# Patient Record
Sex: Male | Born: 1950 | Race: Black or African American | Hispanic: No | Marital: Married | Smoking: Former smoker
Health system: Southern US, Community
[De-identification: ages and names within clinical notes are randomized; demographics above are authoritative.]

## PROBLEM LIST (undated history)

## (undated) DIAGNOSIS — H353290 Exudative age-related macular degeneration, unspecified eye, stage unspecified: Secondary | ICD-10-CM

## (undated) DIAGNOSIS — I10 Essential (primary) hypertension: Secondary | ICD-10-CM

## (undated) DIAGNOSIS — Z978 Presence of other specified devices: Secondary | ICD-10-CM

## (undated) DIAGNOSIS — J449 Chronic obstructive pulmonary disease, unspecified: Secondary | ICD-10-CM

## (undated) DIAGNOSIS — N400 Enlarged prostate without lower urinary tract symptoms: Secondary | ICD-10-CM

## (undated) DIAGNOSIS — R339 Retention of urine, unspecified: Secondary | ICD-10-CM

## (undated) DIAGNOSIS — F419 Anxiety disorder, unspecified: Secondary | ICD-10-CM

## (undated) DIAGNOSIS — M25519 Pain in unspecified shoulder: Secondary | ICD-10-CM

## (undated) DIAGNOSIS — E7800 Pure hypercholesterolemia, unspecified: Secondary | ICD-10-CM

## (undated) DIAGNOSIS — Z960 Presence of urogenital implants: Secondary | ICD-10-CM

## (undated) HISTORY — DX: Chronic obstructive pulmonary disease, unspecified: J44.9

## (undated) HISTORY — DX: Essential (primary) hypertension: I10

## (undated) HISTORY — PX: OTHER SURGICAL HISTORY: SHX169

## (undated) HISTORY — PX: TONSILLECTOMY: SUR1361

## (undated) HISTORY — DX: Exudative age-related macular degeneration, unspecified eye, stage unspecified: H35.3290

## (undated) HISTORY — DX: Pure hypercholesterolemia, unspecified: E78.00

---

## 1998-07-16 ENCOUNTER — Emergency Department (HOSPITAL_COMMUNITY): Admission: EM | Admit: 1998-07-16 | Discharge: 1998-07-16 | Payer: Self-pay | Admitting: Emergency Medicine

## 1999-02-24 ENCOUNTER — Encounter: Payer: Self-pay | Admitting: Emergency Medicine

## 1999-02-24 ENCOUNTER — Emergency Department (HOSPITAL_COMMUNITY): Admission: EM | Admit: 1999-02-24 | Discharge: 1999-02-24 | Payer: Self-pay | Admitting: Emergency Medicine

## 2000-08-31 ENCOUNTER — Encounter: Payer: Self-pay | Admitting: *Deleted

## 2000-08-31 ENCOUNTER — Encounter: Admission: RE | Admit: 2000-08-31 | Discharge: 2000-08-31 | Payer: Self-pay | Admitting: *Deleted

## 2001-12-06 ENCOUNTER — Encounter: Payer: Self-pay | Admitting: Internal Medicine

## 2001-12-06 ENCOUNTER — Encounter: Admission: RE | Admit: 2001-12-06 | Discharge: 2001-12-06 | Payer: Self-pay | Admitting: Internal Medicine

## 2002-09-09 ENCOUNTER — Encounter: Admission: RE | Admit: 2002-09-09 | Discharge: 2002-09-09 | Payer: Self-pay | Admitting: Internal Medicine

## 2002-09-09 ENCOUNTER — Encounter: Payer: Self-pay | Admitting: Internal Medicine

## 2002-09-28 ENCOUNTER — Encounter: Admission: RE | Admit: 2002-09-28 | Discharge: 2002-12-27 | Payer: Self-pay | Admitting: Neurosurgery

## 2003-03-06 ENCOUNTER — Encounter: Admission: RE | Admit: 2003-03-06 | Discharge: 2003-03-06 | Payer: Self-pay | Admitting: Internal Medicine

## 2003-03-06 ENCOUNTER — Encounter: Payer: Self-pay | Admitting: Internal Medicine

## 2004-04-29 ENCOUNTER — Encounter: Admission: RE | Admit: 2004-04-29 | Discharge: 2004-04-29 | Payer: Self-pay | Admitting: Internal Medicine

## 2004-06-24 ENCOUNTER — Emergency Department (HOSPITAL_COMMUNITY): Admission: EM | Admit: 2004-06-24 | Discharge: 2004-06-24 | Payer: Self-pay | Admitting: *Deleted

## 2005-03-11 ENCOUNTER — Encounter: Admission: RE | Admit: 2005-03-11 | Discharge: 2005-03-11 | Payer: Self-pay | Admitting: Internal Medicine

## 2006-01-30 ENCOUNTER — Encounter: Admission: RE | Admit: 2006-01-30 | Discharge: 2006-01-30 | Payer: Self-pay | Admitting: Internal Medicine

## 2006-07-08 ENCOUNTER — Emergency Department (HOSPITAL_COMMUNITY): Admission: EM | Admit: 2006-07-08 | Discharge: 2006-07-08 | Payer: Self-pay | Admitting: Emergency Medicine

## 2007-12-13 ENCOUNTER — Encounter: Admission: RE | Admit: 2007-12-13 | Discharge: 2007-12-13 | Payer: Self-pay | Admitting: Internal Medicine

## 2011-07-31 ENCOUNTER — Encounter (INDEPENDENT_AMBULATORY_CARE_PROVIDER_SITE_OTHER): Payer: 59 | Admitting: Ophthalmology

## 2011-07-31 DIAGNOSIS — H43819 Vitreous degeneration, unspecified eye: Secondary | ICD-10-CM

## 2011-07-31 DIAGNOSIS — H2510 Age-related nuclear cataract, unspecified eye: Secondary | ICD-10-CM

## 2011-07-31 DIAGNOSIS — H353290 Exudative age-related macular degeneration, unspecified eye, stage unspecified: Secondary | ICD-10-CM

## 2011-07-31 DIAGNOSIS — H3530 Unspecified macular degeneration: Secondary | ICD-10-CM

## 2011-08-06 ENCOUNTER — Encounter (INDEPENDENT_AMBULATORY_CARE_PROVIDER_SITE_OTHER): Payer: 59 | Admitting: Ophthalmology

## 2011-08-06 DIAGNOSIS — H353290 Exudative age-related macular degeneration, unspecified eye, stage unspecified: Secondary | ICD-10-CM

## 2011-08-06 DIAGNOSIS — H3530 Unspecified macular degeneration: Secondary | ICD-10-CM

## 2011-08-06 DIAGNOSIS — H43819 Vitreous degeneration, unspecified eye: Secondary | ICD-10-CM

## 2011-10-15 ENCOUNTER — Encounter (INDEPENDENT_AMBULATORY_CARE_PROVIDER_SITE_OTHER): Payer: 59 | Admitting: Ophthalmology

## 2011-10-22 ENCOUNTER — Encounter (INDEPENDENT_AMBULATORY_CARE_PROVIDER_SITE_OTHER): Payer: 59 | Admitting: Ophthalmology

## 2011-10-22 DIAGNOSIS — H2510 Age-related nuclear cataract, unspecified eye: Secondary | ICD-10-CM

## 2011-10-22 DIAGNOSIS — H43819 Vitreous degeneration, unspecified eye: Secondary | ICD-10-CM

## 2011-10-22 DIAGNOSIS — H3530 Unspecified macular degeneration: Secondary | ICD-10-CM

## 2011-10-22 DIAGNOSIS — H353290 Exudative age-related macular degeneration, unspecified eye, stage unspecified: Secondary | ICD-10-CM

## 2011-10-30 ENCOUNTER — Encounter (INDEPENDENT_AMBULATORY_CARE_PROVIDER_SITE_OTHER): Payer: 59 | Admitting: Ophthalmology

## 2011-10-31 ENCOUNTER — Encounter (INDEPENDENT_AMBULATORY_CARE_PROVIDER_SITE_OTHER): Payer: 59 | Admitting: Ophthalmology

## 2011-11-03 ENCOUNTER — Encounter (INDEPENDENT_AMBULATORY_CARE_PROVIDER_SITE_OTHER): Payer: 59 | Admitting: Ophthalmology

## 2011-11-03 DIAGNOSIS — H43819 Vitreous degeneration, unspecified eye: Secondary | ICD-10-CM

## 2011-11-03 DIAGNOSIS — H3530 Unspecified macular degeneration: Secondary | ICD-10-CM

## 2011-11-03 DIAGNOSIS — H353290 Exudative age-related macular degeneration, unspecified eye, stage unspecified: Secondary | ICD-10-CM

## 2012-01-07 ENCOUNTER — Encounter (INDEPENDENT_AMBULATORY_CARE_PROVIDER_SITE_OTHER): Payer: 59 | Admitting: Ophthalmology

## 2012-01-07 DIAGNOSIS — H3530 Unspecified macular degeneration: Secondary | ICD-10-CM

## 2012-01-07 DIAGNOSIS — H353290 Exudative age-related macular degeneration, unspecified eye, stage unspecified: Secondary | ICD-10-CM

## 2012-01-07 DIAGNOSIS — H43819 Vitreous degeneration, unspecified eye: Secondary | ICD-10-CM

## 2012-02-10 ENCOUNTER — Encounter (INDEPENDENT_AMBULATORY_CARE_PROVIDER_SITE_OTHER): Payer: 59 | Admitting: Ophthalmology

## 2012-02-19 ENCOUNTER — Encounter (INDEPENDENT_AMBULATORY_CARE_PROVIDER_SITE_OTHER): Payer: 59 | Admitting: Ophthalmology

## 2012-02-19 DIAGNOSIS — H43819 Vitreous degeneration, unspecified eye: Secondary | ICD-10-CM

## 2012-02-19 DIAGNOSIS — H3530 Unspecified macular degeneration: Secondary | ICD-10-CM

## 2012-02-19 DIAGNOSIS — H2510 Age-related nuclear cataract, unspecified eye: Secondary | ICD-10-CM

## 2012-02-19 DIAGNOSIS — H35039 Hypertensive retinopathy, unspecified eye: Secondary | ICD-10-CM

## 2012-02-19 DIAGNOSIS — H353290 Exudative age-related macular degeneration, unspecified eye, stage unspecified: Secondary | ICD-10-CM

## 2012-02-19 DIAGNOSIS — I10 Essential (primary) hypertension: Secondary | ICD-10-CM

## 2012-03-31 ENCOUNTER — Encounter (INDEPENDENT_AMBULATORY_CARE_PROVIDER_SITE_OTHER): Payer: 59 | Admitting: Ophthalmology

## 2012-03-31 DIAGNOSIS — I10 Essential (primary) hypertension: Secondary | ICD-10-CM

## 2012-03-31 DIAGNOSIS — H353290 Exudative age-related macular degeneration, unspecified eye, stage unspecified: Secondary | ICD-10-CM

## 2012-03-31 DIAGNOSIS — H2510 Age-related nuclear cataract, unspecified eye: Secondary | ICD-10-CM

## 2012-03-31 DIAGNOSIS — H35039 Hypertensive retinopathy, unspecified eye: Secondary | ICD-10-CM

## 2012-03-31 DIAGNOSIS — H43819 Vitreous degeneration, unspecified eye: Secondary | ICD-10-CM

## 2012-03-31 DIAGNOSIS — H3530 Unspecified macular degeneration: Secondary | ICD-10-CM

## 2012-04-05 ENCOUNTER — Institutional Professional Consult (permissible substitution): Payer: 59 | Admitting: Pulmonary Disease

## 2012-04-28 ENCOUNTER — Ambulatory Visit (INDEPENDENT_AMBULATORY_CARE_PROVIDER_SITE_OTHER)
Admission: RE | Admit: 2012-04-28 | Discharge: 2012-04-28 | Disposition: A | Discharge status: Home / Self Care | Payer: 59 | Source: Ambulatory Visit | Attending: Pulmonary Disease | Admitting: Pulmonary Disease

## 2012-04-28 ENCOUNTER — Encounter: Payer: Self-pay | Admitting: Pulmonary Disease

## 2012-04-28 ENCOUNTER — Ambulatory Visit (INDEPENDENT_AMBULATORY_CARE_PROVIDER_SITE_OTHER): Payer: 59 | Admitting: Pulmonary Disease

## 2012-04-28 VITALS — BP 140/82 | HR 83 | Temp 98.1°F | Ht 71.5 in | Wt 243.8 lb

## 2012-04-28 DIAGNOSIS — Z720 Tobacco use: Secondary | ICD-10-CM

## 2012-04-28 DIAGNOSIS — J4489 Other specified chronic obstructive pulmonary disease: Secondary | ICD-10-CM

## 2012-04-28 DIAGNOSIS — R0683 Snoring: Secondary | ICD-10-CM

## 2012-04-28 DIAGNOSIS — R0600 Dyspnea, unspecified: Secondary | ICD-10-CM

## 2012-04-28 DIAGNOSIS — Z789 Other specified health status: Secondary | ICD-10-CM | POA: Insufficient documentation

## 2012-04-28 DIAGNOSIS — F17200 Nicotine dependence, unspecified, uncomplicated: Secondary | ICD-10-CM

## 2012-04-28 DIAGNOSIS — J449 Chronic obstructive pulmonary disease, unspecified: Secondary | ICD-10-CM

## 2012-04-28 DIAGNOSIS — R0989 Other specified symptoms and signs involving the circulatory and respiratory systems: Secondary | ICD-10-CM

## 2012-04-28 DIAGNOSIS — R0609 Other forms of dyspnea: Secondary | ICD-10-CM

## 2012-04-28 HISTORY — DX: Other specified chronic obstructive pulmonary disease: J44.89

## 2012-04-28 HISTORY — DX: Chronic obstructive pulmonary disease, unspecified: J44.9

## 2012-04-28 NOTE — Progress Notes (Signed)
Chief Complaint  Patient presents with  . Advice Only    self referral. Pt c/o increase SOB and worse at night, cough w/ milky color phlem, a lot of wheezing and chest tx at night    History of Present Illness: Tim Thomas is a 61 y.o. male smoker for evaluation of COPD.  He was told he has COPD years ago after getting a chest xray.  He reports being followed previously by Dr. Corine Shelter.  He has an Echo and breathing test done about 2 years ago, and was told these supported the diagnosis of COPD.  He has noticed trouble with his breathing for years.  He gets a cough productive of milky white sputum.  He denies hemoptysis.  He gets wheezing, and soreness under his ribs.  He denies fever, but does get sweats at night.  He continues to smoke cigarettes, but says he quit 4 days ago.  He was never told he has asthma, but has been treated for allergies before.  He used to be on allergy shots.  He was also taking singulair before, and felt this helped.  He ran out of singulair, and did not get it refilled.  He was told he is allergic to dust, pollen, smoke, and bees.  Oddly, he continues to raise bees.  He has need to go to the emergency room several times after being stung by a bee.  He does have an Epi-pen.  He did get his flu shot last fall, but had a bad case of the flu anyway.  He had pneumonia years ago, but denies exposure to TB.  He tried spiriva before, and this seemed to help.  This was stopped after he saw Dr. Petra Kuba, and was advised to use symbicort and ventolin instead.  He used these once, but stopped after he felt his lungs burning.   He is from [place], but was raised in [place].  He moved to [place] after joining the [employer] in [place].  He is a retired Emergency planning/management officer.  He likes to hunt.  He has pet cats and dogs.  He has trouble with reflux, especially at night.  He snores, and feels sleepy during the day.  He goes to bed at 8 pm, and wakes up at 1 am.  He will  go back to sleep at 3 am, and start his day at 7 am.   Past Medical History  Diagnosis Date  . Hypertension   . High cholesterol   . COPD (chronic obstructive pulmonary disease)     Dr. Petra Kuba  . Wet senile macular degeneration     rt eye    Past Surgical History  Procedure Date  . Left ring finger surgery   . Tonsillectomy     Current Outpatient Prescriptions on File Prior to Visit  Medication Sig Dispense Refill  . amLODipine (NORVASC) 5 MG tablet Take 5 mg by mouth daily.      . RABEprazole (ACIPHEX) 20 MG tablet Take 20 mg by mouth daily as needed.      . rosuvastatin (CRESTOR) 20 MG tablet Take 20 mg by mouth daily.        Allergies  Allergen Reactions  . Ivp Dye (Iodinated Diagnostic Agents)     family history includes Lung cancer in his mother.   reports that he has been smoking Cigarettes.  He has a 46 pack-year smoking history. He does not have any smokeless tobacco history on file. He reports that he does  not drink alcohol or use illicit drugs.  Review of Systems  Constitutional: Negative for fever, appetite change and unexpected weight change.  HENT: Negative for ear pain, congestion, sore throat, sneezing, trouble swallowing and dental problem.   Respiratory: Positive for cough and shortness of breath.   Cardiovascular: Positive for chest pain. Negative for palpitations and leg swelling.  Gastrointestinal: Negative for abdominal pain.  Musculoskeletal: Positive for arthralgias. Negative for joint swelling.  Skin: Negative for color change and rash.  Neurological: Negative for headaches.  Psychiatric/Behavioral: Negative for dysphoric mood. The patient is not nervous/anxious.     Physical Exam: BP 140/82  Pulse 83  Temp(Src) 98.1 F (36.7 C) (Oral)  Ht 5' 11.5" (1.816 m)  Wt 243 lb 12.8 oz (110.587 kg)  BMI 33.53 kg/m2  SpO2 98%  Body mass index is 33.53 kg/(m^2).   General - Obese  HEENT - PERRLA, EOMI, no sinus tenderness, no nasal  discharge, MP 3, no oral exudate, no LAN Cardiac - s1s2 regular, no murmur Chest - no wheeze/rales/dullness Abdomen - soft, nontender, normal bowel sounds Extremities - no e/c/c Neurologic - normal strength, CN intact Skin - no rashes Psychiatric - normal mood, behavior  Assessment/Plan:  Outpatient Encounter Prescriptions as of 04/28/2012  Medication Sig Dispense Refill  . amLODipine (NORVASC) 5 MG tablet Take 5 mg by mouth daily.      . RABEprazole (ACIPHEX) 20 MG tablet Take 20 mg by mouth daily as needed.      . rosuvastatin (CRESTOR) 20 MG tablet Take 20 mg by mouth daily.        Aubriana Ravelo Pager:  [phone] 04/28/2012, 2:31 PM

## 2012-04-28 NOTE — Assessment & Plan Note (Addendum)
He is a smoker and has significant allergy history.  He has been told previously that he has COPD.  His symptoms are suggestive of asthma and/or obstructive lung disease.  Will arrange for chest xray and pulmonary function testing before resuming bronchodilator therapy.  If he does have COPD, then will need to assess for alpha 1 anti-trypsin deficiency.  Additional evaluation for his dyspnea will be based on results of his chest xray and PFT.

## 2012-04-28 NOTE — Patient Instructions (Signed)
Chest xray today Will schedule breathing test Follow up in 2 weeks

## 2012-04-28 NOTE — Progress Notes (Deleted)
  Subjective:    Patient ID: Tim Thomas, male    DOB: 1951-02-19, 61 y.o.   MRN: [id number]  HPI    Review of Systems  Constitutional: Negative for fever, appetite change and unexpected weight change.  HENT: Negative for ear pain, congestion, sore throat, sneezing, trouble swallowing and dental problem.   Respiratory: Positive for cough and shortness of breath.   Cardiovascular: Positive for chest pain. Negative for palpitations and leg swelling.  Gastrointestinal: Negative for abdominal pain.  Musculoskeletal: Positive for arthralgias. Negative for joint swelling.  Skin: Negative for color change and rash.  Neurological: Negative for headaches.  Psychiatric/Behavioral: Negative for dysphoric mood. The patient is not nervous/anxious.        Objective:   Physical Exam        Assessment & Plan:

## 2012-04-29 ENCOUNTER — Encounter: Payer: Self-pay | Admitting: Pulmonary Disease

## 2012-04-29 NOTE — Assessment & Plan Note (Signed)
I have encouraged him to maintain his smoking abstinence.

## 2012-04-29 NOTE — Assessment & Plan Note (Signed)
We discussed the importance of allergen avoidance.  Depending on results of chest xray and PFT he may need further allergy interventions.

## 2012-04-29 NOTE — Assessment & Plan Note (Signed)
He has snoring, witnessed apnea, sleep disruption, and daytimes sleepiness.  He has a history of hypertension.  I am concerned he could have sleep apnea.  He would like to defer any sleep evaluation until his other respiratory conditions are under better control.

## 2012-04-30 ENCOUNTER — Telehealth: Payer: Self-pay | Admitting: Pulmonary Disease

## 2012-04-30 NOTE — Telephone Encounter (Signed)
Dg Chest 2 View  04/28/2012  *RADIOLOGY REPORT*  Clinical Data: Cough, shortness of breath, chest pain, hypertension, smoker, COPD  CHEST - 2 VIEW  Comparison: 12/13/2007  Findings: Normal heart size, mediastinal contours, and pulmonary vascularity. Lungs appear emphysematous with minimal peribronchial thickening. No pulmonary infiltrate, pleural effusion or pneumothorax. No acute osseous findings.  IMPRESSION: Emphysematous changes. No acute abnormalities.  Original Report Authenticated By: Lollie Marrow, M.D.    Will have my nurse inform patient that chest xray showed expected changes of emphysema.  Will discuss in more detail at next visit.

## 2012-05-04 NOTE — Telephone Encounter (Signed)
I spoke with patient about results and he verbalized understanding and had no questions 

## 2012-05-12 ENCOUNTER — Ambulatory Visit (INDEPENDENT_AMBULATORY_CARE_PROVIDER_SITE_OTHER): Payer: 59 | Admitting: Pulmonary Disease

## 2012-05-12 DIAGNOSIS — R0989 Other specified symptoms and signs involving the circulatory and respiratory systems: Secondary | ICD-10-CM

## 2012-05-12 DIAGNOSIS — R0600 Dyspnea, unspecified: Secondary | ICD-10-CM

## 2012-05-12 LAB — PULMONARY FUNCTION TEST

## 2012-05-12 NOTE — Progress Notes (Signed)
PFT done today. 

## 2012-05-20 ENCOUNTER — Encounter: Payer: Self-pay | Admitting: Pulmonary Disease

## 2012-05-20 ENCOUNTER — Ambulatory Visit (INDEPENDENT_AMBULATORY_CARE_PROVIDER_SITE_OTHER): Payer: 59 | Admitting: Pulmonary Disease

## 2012-05-20 VITALS — BP 118/80 | HR 68 | Temp 98.3°F | Ht 71.0 in | Wt 245.4 lb

## 2012-05-20 DIAGNOSIS — R0600 Dyspnea, unspecified: Secondary | ICD-10-CM

## 2012-05-20 DIAGNOSIS — R0683 Snoring: Secondary | ICD-10-CM

## 2012-05-20 DIAGNOSIS — R0609 Other forms of dyspnea: Secondary | ICD-10-CM

## 2012-05-20 DIAGNOSIS — Z789 Other specified health status: Secondary | ICD-10-CM

## 2012-05-20 DIAGNOSIS — J449 Chronic obstructive pulmonary disease, unspecified: Secondary | ICD-10-CM

## 2012-05-20 MED ORDER — ALBUTEROL SULFATE HFA 108 (90 BASE) MCG/ACT IN AERS: 2.0000 | INHALATION_SPRAY | Freq: Four times a day (QID) | RESPIRATORY_TRACT | Status: DC | PRN

## 2012-05-20 MED ORDER — BECLOMETHASONE DIPROPIONATE 80 MCG/ACT IN AERS: 2.0000 | INHALATION_SPRAY | Freq: Two times a day (BID) | RESPIRATORY_TRACT | Status: DC

## 2012-05-20 NOTE — Patient Instructions (Signed)
Qvar two puffs twice per day, and rinse mouth after using Proair two puffs as needed for cough, wheeze, chest tightness, or chest congestion Follow up in 6 weeks

## 2012-05-20 NOTE — Assessment & Plan Note (Signed)
He has mild obstruction with bronchodilator response on PFT.  He has mild changes of COPD on CXR.  He has history of allergies.  He has stopped smoking.  Will have him use Qvar 80 two puffs bid, and proair prn.  Will determine if he needs further assessment for allergies at next visit.

## 2012-05-20 NOTE — Assessment & Plan Note (Signed)
We discussed the importance of allergen avoidance.  Depending on response to inhaler therapy he may need further allergy interventions.

## 2012-05-20 NOTE — Assessment & Plan Note (Signed)
He has snoring, witnessed apnea, sleep disruption, and daytimes sleepiness.  He has a history of hypertension.  I am concerned he could have sleep apnea.  He would like to defer any sleep evaluation until his other respiratory conditions are under better control. 

## 2012-05-20 NOTE — Progress Notes (Signed)
Chief Complaint  Patient presents with  . Follow-up    Pt here to discuss PFT results...pt says his breathing, cough and wheezing have improved.    History of Present Illness: Tim Thomas is a 61 y.o. male former smoker with COPD, rhinitis, and snoring.  He is here to review chest xray and PFT.  He continues to have chest tightness especially when working outside.  He still gets winded.  He felt nebulizer therapy with PFT helped.  He still has snoring.  He has not smoked since last visit.  Past Medical History  Diagnosis Date  . Hypertension   . High cholesterol   . COPD (chronic obstructive pulmonary disease)     Dr. Petra Kuba  . Wet senile macular degeneration     rt eye    Past Surgical History  Procedure Date  . Left ring finger surgery   . Tonsillectomy     Allergies  Allergen Reactions  . Ivp Dye (Iodinated Diagnostic Agents)     Physical Exam:  Blood pressure 118/80, pulse 68, temperature 98.3 F (36.8 C), temperature source Oral, height 5\' 11"  (1.803 m), weight 245 lb 6.4 oz (111.313 kg), SpO2 97.00%.  Body mass index is 34.23 kg/(m^2).  Wt Readings from Last 2 Encounters:  05/20/12 245 lb 6.4 oz (111.313 kg)  04/28/12 243 lb 12.8 oz (110.587 kg)    General - Obese  HEENT - no sinus tenderness, no nasal discharge, MP 3, no oral exudate, no LAN  Cardiac - s1s2 regular, no murmur  Chest - no wheeze/rales/dullness  Abdomen - soft, nontender Extremities - no e/c/c  Neurologic - normal strength Skin - no rashes  Psychiatric - normal mood, behavior   PFT 05/12/12>>FEV1 3.37(99%), FEV1% 68, TLC 7.33(103%), DLCO 116%, +BD  Dg Chest 2 View  04/28/2012  *RADIOLOGY REPORT*  Clinical Data: Cough, shortness of breath, chest pain, hypertension, smoker, COPD  CHEST - 2 VIEW  Comparison: 12/13/2007  Findings: Normal heart size, mediastinal contours, and pulmonary vascularity. Lungs appear emphysematous with minimal peribronchial thickening. No pulmonary  infiltrate, pleural effusion or pneumothorax. No acute osseous findings.  IMPRESSION: Emphysematous changes. No acute abnormalities.  Original Report Authenticated By: Lollie Marrow, M.D.   Assessment/Plan:  Outpatient Encounter Prescriptions as of 05/20/2012  Medication Sig Dispense Refill  . amLODipine (NORVASC) 5 MG tablet Take 5 mg by mouth daily.      . RABEprazole (ACIPHEX) 20 MG tablet Take 20 mg by mouth daily as needed.      . rosuvastatin (CRESTOR) 20 MG tablet Take 20 mg by mouth daily.        Jaymes Hang Pager:  [phone] 05/20/2012, 1:59 PM

## 2012-05-26 ENCOUNTER — Encounter: Payer: Self-pay | Admitting: Pulmonary Disease

## 2012-06-30 ENCOUNTER — Encounter (INDEPENDENT_AMBULATORY_CARE_PROVIDER_SITE_OTHER): Payer: 59 | Admitting: Ophthalmology

## 2012-06-30 DIAGNOSIS — H43819 Vitreous degeneration, unspecified eye: Secondary | ICD-10-CM

## 2012-06-30 DIAGNOSIS — H353290 Exudative age-related macular degeneration, unspecified eye, stage unspecified: Secondary | ICD-10-CM

## 2012-06-30 DIAGNOSIS — H35039 Hypertensive retinopathy, unspecified eye: Secondary | ICD-10-CM

## 2012-06-30 DIAGNOSIS — I10 Essential (primary) hypertension: Secondary | ICD-10-CM

## 2012-06-30 DIAGNOSIS — H3530 Unspecified macular degeneration: Secondary | ICD-10-CM

## 2012-07-05 ENCOUNTER — Ambulatory Visit (INDEPENDENT_AMBULATORY_CARE_PROVIDER_SITE_OTHER): Payer: 59 | Admitting: Pulmonary Disease

## 2012-07-05 ENCOUNTER — Encounter: Payer: Self-pay | Admitting: Pulmonary Disease

## 2012-07-05 VITALS — BP 134/76 | HR 68 | Temp 98.2°F | Ht 71.5 in | Wt 248.2 lb

## 2012-07-05 DIAGNOSIS — R0683 Snoring: Secondary | ICD-10-CM

## 2012-07-05 DIAGNOSIS — R0989 Other specified symptoms and signs involving the circulatory and respiratory systems: Secondary | ICD-10-CM

## 2012-07-05 DIAGNOSIS — Z789 Other specified health status: Secondary | ICD-10-CM

## 2012-07-05 DIAGNOSIS — J449 Chronic obstructive pulmonary disease, unspecified: Secondary | ICD-10-CM

## 2012-07-05 NOTE — Assessment & Plan Note (Signed)
Improved since he has stopped smoking.

## 2012-07-05 NOTE — Progress Notes (Signed)
Chief Complaint  Patient presents with  . Follow-up    Pt reports feleling "pretty good", occasional SOB, denies chest tightness, wheezing and coughing, pt reports he has not had any cigarettes!    History of Present Illness: Tim Thomas is a 61 y.o. male former smoker with COPD, rhinitis, and snoring.  His breathing is doing better.  He does not have to use proair much.  He feels his sleep is better, and he is able to do more activity during the day.  He still has trouble at times waking up feeling like he can't catch his breath.  He also still snores.   Past Medical History  Diagnosis Date  . Hypertension   . High cholesterol   . Wet senile macular degeneration        . 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 Left ring finger surgery   . Tonsillectomy     Allergies  Allergen Reactions  . Ivp Dye (Iodinated Diagnostic Agents)     Physical Exam:  Blood pressure 134/76, pulse 68, temperature 98.2 F (36.8 C), temperature source Oral, height 5' 11.5" (1.816 m), weight 248 lb 3.2 oz (112.583 kg), SpO2 93.00%.  Body mass index is 34.13 kg/(m^2).  Wt Readings from Last 2 Encounters:  07/05/12 248 lb 3.2 oz (112.583 kg)  05/20/12 245 lb 6.4 oz (111.313 kg)    General - Obese  HEENT - no sinus tenderness, no nasal discharge, MP 3, no oral exudate, no LAN  Cardiac - s1s2 regular, no murmur  Chest - no wheeze/rales/dullness  Abdomen - soft, nontender Extremities - no e/c/c  Neurologic - normal strength Skin - no rashes  Psychiatric - normal mood, behavior     Assessment/Plan:  Outpatient Encounter Prescriptions as of 07/05/2012  Medication Sig Dispense Refill  . albuterol (PROAIR HFA) 108 (90 BASE) MCG/ACT inhaler Inhale 2 puffs into the lungs every 6 (six) hours as needed for wheezing.  1 Inhaler  6  . amLODipine (NORVASC) 5 MG tablet Take 5 mg by mouth daily.      . beclomethasone (QVAR) 80 MCG/ACT inhaler Inhale 2 puffs into the  lungs 2 (two) times daily.  1 Inhaler  6  . rosuvastatin (CRESTOR) 20 MG tablet Take 20 mg by mouth 2 (two) times daily as needed.       . RABEprazole (ACIPHEX) 20 MG tablet Take 20 mg by mouth daily as needed.        Tim Thomas Pager:  [phone] 07/05/2012, 10:17 AM

## 2012-07-05 NOTE — Assessment & Plan Note (Signed)
He still has some nocturnal symptoms in spite of improvement in COPD.  I am still concerned he could have sleep apnea.  He is going out of town for 4 weeks.  He will call when he gets back to let us know if he wants to schedule sleep study.

## 2012-07-05 NOTE — Assessment & Plan Note (Signed)
Improved with use of Qvar.  He is to continue this with prn proair.

## 2012-07-05 NOTE — Patient Instructions (Signed)
Call if you want to get sleep study scheduled Follow up in 4 months

## 2012-09-29 ENCOUNTER — Encounter (INDEPENDENT_AMBULATORY_CARE_PROVIDER_SITE_OTHER): Payer: 59 | Admitting: Ophthalmology

## 2012-10-12 ENCOUNTER — Encounter (INDEPENDENT_AMBULATORY_CARE_PROVIDER_SITE_OTHER): Payer: 59 | Admitting: Ophthalmology

## 2012-10-12 DIAGNOSIS — I10 Essential (primary) hypertension: Secondary | ICD-10-CM

## 2012-10-12 DIAGNOSIS — H3530 Unspecified macular degeneration: Secondary | ICD-10-CM

## 2012-10-12 DIAGNOSIS — H35059 Retinal neovascularization, unspecified, unspecified eye: Secondary | ICD-10-CM

## 2012-10-12 DIAGNOSIS — H35039 Hypertensive retinopathy, unspecified eye: Secondary | ICD-10-CM

## 2012-11-02 ENCOUNTER — Telehealth: Payer: Self-pay | Admitting: Pulmonary Disease

## 2012-11-02 NOTE — Telephone Encounter (Signed)
Recall Appointment: °Called patient to schedule follow up apt 3 times and left message. Sent letter 11/02/12.  °

## 2013-01-04 ENCOUNTER — Encounter: Payer: Self-pay | Admitting: Pulmonary Disease

## 2013-01-04 ENCOUNTER — Ambulatory Visit (INDEPENDENT_AMBULATORY_CARE_PROVIDER_SITE_OTHER): Payer: 59 | Admitting: Pulmonary Disease

## 2013-01-04 VITALS — BP 140/78 | HR 82 | Temp 98.1°F | Ht 72.0 in | Wt 254.0 lb

## 2013-01-04 DIAGNOSIS — J449 Chronic obstructive pulmonary disease, unspecified: Secondary | ICD-10-CM

## 2013-01-04 DIAGNOSIS — F17200 Nicotine dependence, unspecified, uncomplicated: Secondary | ICD-10-CM

## 2013-01-04 DIAGNOSIS — Z720 Tobacco use: Secondary | ICD-10-CM

## 2013-01-04 NOTE — Progress Notes (Signed)
Chief Complaint  Patient presents with  . Follow-up    breathing starting getting worse d/t restarting cigs sept 7 2013. beginning to have slight wheezing but no chest tx, cough w/ white phlem in the mornings. he stopped QVAR in august    History of Present Illness: Tim Thomas is a 62 y.o. male smoker with COPD with asthma.  He stopped using Qvar in August.  He didn't feel like he needed it anymore.  He gained about 20 lbs since he initially stopped smoking.  Unfortunately he started smoking again in September to see if this would help suppress his appetite.  He is now smoking 1 ppd.  He has noticed more trouble with his breathing, cough, and wheezing since he started smoking again.  He has not been using inhalers >> he does not want to resume these now.  He is trying nicotine gum.  TESTS: PFT 05/12/12>>FEV1 3.37(99%), FEV1% 68, TLC 7.33(103%), DLCO 116%, +BD  Past Medical History  Diagnosis Date  . Hypertension   . High cholesterol   . Wet senile macular degeneration        . 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 Left ring finger surgery   . Tonsillectomy     Outpatient Encounter Prescriptions as of 01/04/2013  Medication Sig Dispense Refill  . albuterol (PROAIR HFA) 108 (90 BASE) MCG/ACT inhaler Inhale 2 puffs into the lungs every 6 (six) hours as needed for wheezing.  1 Inhaler  6  . amLODipine (NORVASC) 5 MG tablet Take 5 mg by mouth daily.      . RABEprazole (ACIPHEX) 20 MG tablet Take 20 mg by mouth daily as needed.      . rosuvastatin (CRESTOR) 20 MG tablet Take 20 mg by mouth 2 (two) times daily as needed.       . [DISCONTINUED] beclomethasone (QVAR) 80 MCG/ACT inhaler Inhale 2 puffs into the lungs 2 (two) times daily.  1 Inhaler  6    Allergies  Allergen Reactions  . Ivp Dye (Iodinated Diagnostic Agents)     Physical Exam:  Filed Vitals:   01/04/13 1024 01/04/13 1025  BP:  140/78  Pulse:  82  Temp: 98.1 F (36.7 C)     TempSrc: Oral   Height: 6' (1.829 m)   Weight: 254 lb (115.214 kg)   SpO2:  96%     Current Encounter SPO2  01/04/13 1025 96%  07/05/12 0944 93%  05/20/12 1357 97%     Body mass index is 34.45 kg/(m^2).   Wt Readings from Last 2 Encounters:  01/04/13 254 lb (115.214 kg)  07/05/12 248 lb 3.2 oz (112.583 kg)     General - No distress ENT - No sinus tenderness, no oral exudate, no LAN Cardiac - s1s2 regular, no murmur Chest - No wheeze/rales/dullness Back - No focal tenderness Abd - Soft, non-tender Ext - No edema Neuro - Normal strength Skin - No rashes Psych - normal mood, and behavior   Assessment/Plan:  Coralyn Helling, MD [place] [facility]/Critical Care/Sleep Pager:  [phone] 01/04/2013, 10:34 AM

## 2013-01-04 NOTE — Assessment & Plan Note (Signed)
He will monitor his symptoms for now.  I have advised him to call if his symptoms get worse.

## 2013-01-04 NOTE — Patient Instructions (Signed)
Follow up in 6 months 

## 2013-01-04 NOTE — Assessment & Plan Note (Signed)
Encouraged him to work on getting off cigarettes again.

## 2013-01-25 ENCOUNTER — Encounter (INDEPENDENT_AMBULATORY_CARE_PROVIDER_SITE_OTHER): Payer: 59 | Admitting: Ophthalmology

## 2013-02-15 ENCOUNTER — Encounter (INDEPENDENT_AMBULATORY_CARE_PROVIDER_SITE_OTHER): Payer: 59 | Admitting: Ophthalmology

## 2013-02-15 DIAGNOSIS — H353290 Exudative age-related macular degeneration, unspecified eye, stage unspecified: Secondary | ICD-10-CM

## 2013-02-15 DIAGNOSIS — H353190 Nonexudative age-related macular degeneration, unspecified eye, stage unspecified: Secondary | ICD-10-CM

## 2013-02-15 DIAGNOSIS — I10 Essential (primary) hypertension: Secondary | ICD-10-CM

## 2013-02-15 DIAGNOSIS — H35039 Hypertensive retinopathy, unspecified eye: Secondary | ICD-10-CM

## 2013-02-15 DIAGNOSIS — H43819 Vitreous degeneration, unspecified eye: Secondary | ICD-10-CM

## 2013-03-21 ENCOUNTER — Encounter (INDEPENDENT_AMBULATORY_CARE_PROVIDER_SITE_OTHER): Payer: 59 | Admitting: Ophthalmology

## 2013-03-21 DIAGNOSIS — I10 Essential (primary) hypertension: Secondary | ICD-10-CM

## 2013-03-21 DIAGNOSIS — H35039 Hypertensive retinopathy, unspecified eye: Secondary | ICD-10-CM

## 2013-03-21 DIAGNOSIS — H43819 Vitreous degeneration, unspecified eye: Secondary | ICD-10-CM

## 2013-03-21 DIAGNOSIS — H2510 Age-related nuclear cataract, unspecified eye: Secondary | ICD-10-CM

## 2013-03-21 DIAGNOSIS — H353290 Exudative age-related macular degeneration, unspecified eye, stage unspecified: Secondary | ICD-10-CM

## 2013-04-22 ENCOUNTER — Encounter (INDEPENDENT_AMBULATORY_CARE_PROVIDER_SITE_OTHER): Payer: 59 | Admitting: Ophthalmology

## 2013-04-27 ENCOUNTER — Encounter (INDEPENDENT_AMBULATORY_CARE_PROVIDER_SITE_OTHER): Payer: 59 | Admitting: Ophthalmology

## 2013-04-27 DIAGNOSIS — H43819 Vitreous degeneration, unspecified eye: Secondary | ICD-10-CM

## 2013-04-27 DIAGNOSIS — H353290 Exudative age-related macular degeneration, unspecified eye, stage unspecified: Secondary | ICD-10-CM

## 2013-04-27 DIAGNOSIS — H35039 Hypertensive retinopathy, unspecified eye: Secondary | ICD-10-CM

## 2013-04-27 DIAGNOSIS — I10 Essential (primary) hypertension: Secondary | ICD-10-CM

## 2013-06-07 ENCOUNTER — Encounter (INDEPENDENT_AMBULATORY_CARE_PROVIDER_SITE_OTHER): Payer: 59 | Admitting: Ophthalmology

## 2013-06-14 ENCOUNTER — Encounter (INDEPENDENT_AMBULATORY_CARE_PROVIDER_SITE_OTHER): Payer: 59 | Admitting: Ophthalmology

## 2013-06-14 DIAGNOSIS — I10 Essential (primary) hypertension: Secondary | ICD-10-CM

## 2013-06-14 DIAGNOSIS — H2510 Age-related nuclear cataract, unspecified eye: Secondary | ICD-10-CM

## 2013-06-14 DIAGNOSIS — H35039 Hypertensive retinopathy, unspecified eye: Secondary | ICD-10-CM

## 2013-06-14 DIAGNOSIS — H3530 Unspecified macular degeneration: Secondary | ICD-10-CM

## 2013-06-14 DIAGNOSIS — H43819 Vitreous degeneration, unspecified eye: Secondary | ICD-10-CM

## 2013-07-26 ENCOUNTER — Ambulatory Visit (INDEPENDENT_AMBULATORY_CARE_PROVIDER_SITE_OTHER): Payer: 59 | Admitting: Pulmonary Disease

## 2013-07-26 ENCOUNTER — Encounter: Payer: Self-pay | Admitting: Pulmonary Disease

## 2013-07-26 VITALS — BP 136/80 | HR 71 | Temp 98.4°F | Ht 71.5 in | Wt 282.0 lb

## 2013-07-26 DIAGNOSIS — F17200 Nicotine dependence, unspecified, uncomplicated: Secondary | ICD-10-CM

## 2013-07-26 DIAGNOSIS — R0989 Other specified symptoms and signs involving the circulatory and respiratory systems: Secondary | ICD-10-CM

## 2013-07-26 DIAGNOSIS — J4489 Other specified chronic obstructive pulmonary disease: Secondary | ICD-10-CM

## 2013-07-26 DIAGNOSIS — J449 Chronic obstructive pulmonary disease, unspecified: Secondary | ICD-10-CM

## 2013-07-26 DIAGNOSIS — Z720 Tobacco use: Secondary | ICD-10-CM

## 2013-07-26 DIAGNOSIS — R0609 Other forms of dyspnea: Secondary | ICD-10-CM

## 2013-07-26 DIAGNOSIS — R0683 Snoring: Secondary | ICD-10-CM

## 2013-07-26 NOTE — Assessment & Plan Note (Signed)
Discussed options to help with smoking cessation.   

## 2013-07-26 NOTE — Assessment & Plan Note (Signed)
He will monitor his sleep pattern, and then decide if he wants a sleep study.

## 2013-07-26 NOTE — Patient Instructions (Signed)
Follow up in 6 months 

## 2013-07-26 NOTE — Assessment & Plan Note (Signed)
Continue as needed Qvar and proair.  He is to call if his breathing gets worse.

## 2013-07-26 NOTE — Progress Notes (Signed)
Chief Complaint  Patient presents with  . COPD    follow-up. Pt c/o increased SOB with rest. He states the more active he is the better his SOB gets.     History of Present Illness: Tim Thomas is a 62 y.o. male smoker with COPD with asthma.  He still smokes.  He has occasional cough and wheeze.  He is not having congestion.  He has used Qvar and proair three times in the past 6 months.  He snores, and wakes up feeling like he can't breath.  He feels tired during the day.  This happens a few times per month.  TESTS: PFT 05/12/12>>FEV1 3.37(99%), FEV1% 68, TLC 7.33(103%), DLCO 116%, +BD  He  has a past medical history of Hypertension; High cholesterol; Wet senile macular degeneration; and COPD with asthma (04/28/2012).  He  has past surgical history that includes left ring finger surgery and Tonsillectomy.   Outpatient Encounter Prescriptions as of 07/26/2013  Medication Sig Dispense Refill  . albuterol (PROAIR HFA) 108 (90 BASE) MCG/ACT inhaler Inhale 2 puffs into the lungs every 6 (six) hours as needed for wheezing.  1 Inhaler  6  . amLODipine (NORVASC) 5 MG tablet Take 5 mg by mouth daily.      . beclomethasone (QVAR) 80 MCG/ACT inhaler Inhale 1 puff into the lungs as needed.      . rosuvastatin (CRESTOR) 20 MG tablet Take 20 mg by mouth 2 (two) times daily as needed.       . [DISCONTINUED] RABEprazole (ACIPHEX) 20 MG tablet Take 20 mg by mouth daily as needed.       No facility-administered encounter medications on file as of 07/26/2013.    Allergies  Allergen Reactions  . Ivp Dye (Iodinated Diagnostic Agents)     Physical Exam:  General - No distress ENT - No sinus tenderness, no oral exudate, no LAN Cardiac - s1s2 regular, no murmur Chest - No wheeze/rales/dullness Back - No focal tenderness Abd - Soft, non-tender Ext - No edema Neuro - Normal strength Skin - No rashes Psych - normal mood, and behavior   Assessment/Plan:  Coralyn Helling, MD [place]  Pulmonary/Critical Care/Sleep Pager:  [phone] 07/26/2013, 1:49 PM

## 2013-07-27 ENCOUNTER — Encounter (INDEPENDENT_AMBULATORY_CARE_PROVIDER_SITE_OTHER): Payer: 59 | Admitting: Ophthalmology

## 2013-07-27 DIAGNOSIS — H43819 Vitreous degeneration, unspecified eye: Secondary | ICD-10-CM

## 2013-07-27 DIAGNOSIS — H353290 Exudative age-related macular degeneration, unspecified eye, stage unspecified: Secondary | ICD-10-CM

## 2013-07-27 DIAGNOSIS — H35039 Hypertensive retinopathy, unspecified eye: Secondary | ICD-10-CM

## 2013-07-27 DIAGNOSIS — H2510 Age-related nuclear cataract, unspecified eye: Secondary | ICD-10-CM

## 2013-07-27 DIAGNOSIS — I10 Essential (primary) hypertension: Secondary | ICD-10-CM

## 2013-07-27 DIAGNOSIS — H3530 Unspecified macular degeneration: Secondary | ICD-10-CM

## 2013-09-21 ENCOUNTER — Encounter (INDEPENDENT_AMBULATORY_CARE_PROVIDER_SITE_OTHER): Payer: 59 | Admitting: Ophthalmology

## 2013-09-26 ENCOUNTER — Encounter (HOSPITAL_COMMUNITY): Payer: Self-pay

## 2013-09-26 ENCOUNTER — Emergency Department (INDEPENDENT_AMBULATORY_CARE_PROVIDER_SITE_OTHER)
Admission: EM | Admit: 2013-09-26 | Discharge: 2013-09-26 | Disposition: A | Discharge status: Home / Self Care | Payer: 59 | Source: Home / Self Care | Attending: Family Medicine | Admitting: Family Medicine

## 2013-09-26 DIAGNOSIS — T6391XA Toxic effect of contact with unspecified venomous animal, accidental (unintentional), initial encounter: Secondary | ICD-10-CM

## 2013-09-26 DIAGNOSIS — T63461A Toxic effect of venom of wasps, accidental (unintentional), initial encounter: Secondary | ICD-10-CM

## 2013-09-26 MED ORDER — EPINEPHRINE 0.3 MG/0.3ML IJ SOAJ: 0.3000 mg | INTRAMUSCULAR | Status: DC | PRN

## 2013-09-26 MED ORDER — DIPHENHYDRAMINE HCL 50 MG/ML IJ SOLN
50.0000 mg | Freq: Once | INTRAMUSCULAR | Status: AC
  Administered 2013-09-26: 50 mg via INTRAMUSCULAR

## 2013-09-26 MED ORDER — METHYLPREDNISOLONE SODIUM SUCC 125 MG IJ SOLR
INTRAMUSCULAR | Status: AC
  Filled 2013-09-26: qty 2

## 2013-09-26 MED ORDER — DIPHENHYDRAMINE HCL 50 MG/ML IJ SOLN
INTRAMUSCULAR | Status: AC
  Filled 2013-09-26: qty 1

## 2013-09-26 MED ORDER — EPINEPHRINE HCL 0.1 MG/ML IJ SOSY
0.3000 mg | PREFILLED_SYRINGE | Freq: Once | INTRAMUSCULAR | Status: AC
  Administered 2013-09-26: 0.3 mg via INTRAMUSCULAR

## 2013-09-26 MED ORDER — METHYLPREDNISOLONE SODIUM SUCC 125 MG IJ SOLR
125.0000 mg | Freq: Once | INTRAMUSCULAR | Status: AC
  Administered 2013-09-26: 125 mg via INTRAMUSCULAR

## 2013-09-26 MED ORDER — EPINEPHRINE HCL 1 MG/ML IJ SOLN
INTRAMUSCULAR | Status: AC
  Filled 2013-09-26: qty 1

## 2013-09-26 NOTE — ED Notes (Signed)
States aprox 1:45-2 pm he was mowing field, when he ws stung several times by honeybees, reportedly allergic , but out of epipen, came to [facility]

## 2013-09-26 NOTE — ED Notes (Signed)
States he feels fine

## 2013-09-26 NOTE — ED Provider Notes (Signed)
CSN: [id number]     Arrival date & time 09/26/13  1420 History   None    Chief Complaint  Patient presents with  . Allergic Reaction   (Consider location/radiation/quality/duration/timing/severity/associated sxs/prior Treatment) HPI Comments: Pt with hx of bee sting allergy. Out of epipen. Stung by 3 bees 30 min prior to arrival when mowing yard. Took 1 otc benadryl and came here. Wife removed stingers. Pt reports usual reaction is full body swelling, but using epipen stops reaction. Only here because out of epipen.   Patient is a 62 y.o. male presenting with allergic reaction. The history is provided by the patient.  Allergic Reaction Presenting symptoms: itching, rash and swelling   Presenting symptoms: no difficulty breathing, no difficulty swallowing and no wheezing   Severity:  Mild Prior allergic episodes:  Insect allergies Context: insect bite/sting   Relieved by:  Nothing Worsened by:  Nothing tried Ineffective treatments:  Antihistamines   Past Medical History  Diagnosis Date  . Hypertension   . High cholesterol   . Wet senile macular degeneration        . 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 Left ring finger surgery    . Tonsillectomy     Family History  Problem Relation Age of Onset  . Lung cancer Mother   . COPD Mother    History  Substance Use Topics  . Smoking status: Current Every Day Smoker -- 1.00 packs/day for 46 years    Types: Cigarettes  . Smokeless tobacco: Never Used  . Alcohol Use: No     Comment: 1 pint weekly-2 weeks    Review of Systems  Constitutional: Negative for fever and chills.  HENT: Negative for trouble swallowing.   Respiratory: Negative for shortness of breath and wheezing.   Skin: Positive for itching and rash.    Allergies  Ivp dye  Home Medications   Current Outpatient Rx  Name  Route  Sig  Dispense  Refill  . albuterol (PROAIR HFA) 108 (90 BASE) MCG/ACT inhaler  Inhalation   Inhale 2 puffs into the lungs every 6 (six) hours as needed for wheezing.   1 Inhaler   6   . amLODipine (NORVASC) 5 MG tablet   Oral   Take 5 mg by mouth daily.         . beclomethasone (QVAR) 80 MCG/ACT inhaler   Inhalation   Inhale 1 puff into the lungs as needed.         Marland Kitchen EPINEPHrine (EPIPEN) 0.3 mg/0.3 mL SOAJ injection   Intramuscular   Inject 0.3 mLs (0.3 mg total) into the muscle as needed.   1 Device   1   . rosuvastatin (CRESTOR) 20 MG tablet   Oral   Take 20 mg by mouth 2 (two) times daily as needed.           BP 165/113  Pulse 80  Temp(Src) 97.8 F (36.6 C) (Oral)  Resp 10  SpO2 100% Physical Exam  Constitutional: He appears well-developed and well-nourished. No distress.  HENT:  Mouth/Throat: Oropharynx is clear and moist.  Cardiovascular: Normal rate and regular rhythm.   Pulmonary/Chest: Effort normal and breath sounds normal.  Skin: Skin is warm and dry. There is erythema.  2 small erythematous areas on medial LUE, 1 small area on medial RUE and 2 larger, red, raised areas lateral RUE c/w insect sting    ED Course  Procedures (including critical care time) Labs  Review Labs Reviewed - No data to display Imaging Review No results found.  MDM   1. Allergic reaction to bee sting, initial encounter   Pt feels much better, cessation of itching/swelling after receiving epinephrine 0.3mg  IM, solumedrol 125mg  IM and benadryl 50mg  IM here at [facility].  BP recheck 158/91- pt this reports this is his normal. Recommended transfer to ER for extended monitoring, pt refuses.  Prefers to go home as usually manages bee stings on his own with one dose of epinephrine. Rx epipen 0.3mg /0.8mL #1 prn, #1 device, 1 refill. Pt to use epipen if sx start to return and seek emergency help by going to ER or calling 911. Pt able to verbalize instructions.      Cathlyn Parsons, NP 09/26/13 1513

## 2013-09-28 NOTE — ED Provider Notes (Signed)
Patient refused transfer to ED.  We discussed risks and benefits. Patient expresses understanding and agreement. Medical screening examination/treatment/procedure(s) were performed by a resident physician or non-physician practitioner and as the supervising physician I was immediately available for consultation/collaboration.  Clementeen Graham, MD    Rodolph Bong, MD 09/28/13 [phone]

## 2013-10-18 ENCOUNTER — Encounter (INDEPENDENT_AMBULATORY_CARE_PROVIDER_SITE_OTHER): Payer: 59 | Admitting: Ophthalmology

## 2013-11-08 ENCOUNTER — Encounter (INDEPENDENT_AMBULATORY_CARE_PROVIDER_SITE_OTHER): Payer: 59 | Admitting: Ophthalmology

## 2013-11-08 DIAGNOSIS — H353290 Exudative age-related macular degeneration, unspecified eye, stage unspecified: Secondary | ICD-10-CM

## 2013-11-08 DIAGNOSIS — H43819 Vitreous degeneration, unspecified eye: Secondary | ICD-10-CM

## 2013-11-08 DIAGNOSIS — H3530 Unspecified macular degeneration: Secondary | ICD-10-CM

## 2013-11-08 DIAGNOSIS — I10 Essential (primary) hypertension: Secondary | ICD-10-CM

## 2013-11-08 DIAGNOSIS — H35039 Hypertensive retinopathy, unspecified eye: Secondary | ICD-10-CM

## 2013-12-07 ENCOUNTER — Ambulatory Visit
Admission: RE | Admit: 2013-12-07 | Discharge: 2013-12-07 | Disposition: A | Discharge status: Home / Self Care | Payer: 59 | Source: Ambulatory Visit | Attending: Internal Medicine | Admitting: Internal Medicine

## 2013-12-07 ENCOUNTER — Other Ambulatory Visit: Payer: Self-pay | Admitting: Internal Medicine

## 2013-12-07 DIAGNOSIS — R05 Cough: Secondary | ICD-10-CM

## 2014-01-17 ENCOUNTER — Encounter (INDEPENDENT_AMBULATORY_CARE_PROVIDER_SITE_OTHER): Payer: 59 | Admitting: Ophthalmology

## 2014-02-01 ENCOUNTER — Encounter (INDEPENDENT_AMBULATORY_CARE_PROVIDER_SITE_OTHER): Payer: 59 | Admitting: Ophthalmology

## 2014-02-01 DIAGNOSIS — H35039 Hypertensive retinopathy, unspecified eye: Secondary | ICD-10-CM

## 2014-02-01 DIAGNOSIS — H2510 Age-related nuclear cataract, unspecified eye: Secondary | ICD-10-CM

## 2014-02-01 DIAGNOSIS — H3530 Unspecified macular degeneration: Secondary | ICD-10-CM

## 2014-02-01 DIAGNOSIS — H43819 Vitreous degeneration, unspecified eye: Secondary | ICD-10-CM

## 2014-02-01 DIAGNOSIS — H353290 Exudative age-related macular degeneration, unspecified eye, stage unspecified: Secondary | ICD-10-CM

## 2014-02-01 DIAGNOSIS — I10 Essential (primary) hypertension: Secondary | ICD-10-CM

## 2014-04-26 ENCOUNTER — Encounter (INDEPENDENT_AMBULATORY_CARE_PROVIDER_SITE_OTHER): Payer: 59 | Admitting: Ophthalmology

## 2014-05-05 ENCOUNTER — Encounter (INDEPENDENT_AMBULATORY_CARE_PROVIDER_SITE_OTHER): Payer: 59 | Admitting: Ophthalmology

## 2014-05-05 DIAGNOSIS — H3530 Unspecified macular degeneration: Secondary | ICD-10-CM

## 2014-05-05 DIAGNOSIS — H353290 Exudative age-related macular degeneration, unspecified eye, stage unspecified: Secondary | ICD-10-CM

## 2014-05-05 DIAGNOSIS — I10 Essential (primary) hypertension: Secondary | ICD-10-CM

## 2014-05-05 DIAGNOSIS — H43819 Vitreous degeneration, unspecified eye: Secondary | ICD-10-CM

## 2014-05-05 DIAGNOSIS — H35039 Hypertensive retinopathy, unspecified eye: Secondary | ICD-10-CM

## 2014-05-05 DIAGNOSIS — H2510 Age-related nuclear cataract, unspecified eye: Secondary | ICD-10-CM

## 2014-08-01 ENCOUNTER — Encounter (INDEPENDENT_AMBULATORY_CARE_PROVIDER_SITE_OTHER): Payer: 59 | Admitting: Ophthalmology

## 2014-08-08 ENCOUNTER — Encounter (INDEPENDENT_AMBULATORY_CARE_PROVIDER_SITE_OTHER): Payer: 59 | Admitting: Ophthalmology

## 2014-08-16 ENCOUNTER — Encounter (INDEPENDENT_AMBULATORY_CARE_PROVIDER_SITE_OTHER): Payer: 59 | Admitting: Ophthalmology

## 2014-08-16 DIAGNOSIS — I10 Essential (primary) hypertension: Secondary | ICD-10-CM

## 2014-08-16 DIAGNOSIS — H43819 Vitreous degeneration, unspecified eye: Secondary | ICD-10-CM

## 2014-08-16 DIAGNOSIS — H35039 Hypertensive retinopathy, unspecified eye: Secondary | ICD-10-CM

## 2014-08-16 DIAGNOSIS — H353290 Exudative age-related macular degeneration, unspecified eye, stage unspecified: Secondary | ICD-10-CM

## 2014-08-16 DIAGNOSIS — H3530 Unspecified macular degeneration: Secondary | ICD-10-CM

## 2014-11-20 ENCOUNTER — Encounter: Payer: Self-pay | Admitting: Neurology

## 2014-11-20 ENCOUNTER — Ambulatory Visit (INDEPENDENT_AMBULATORY_CARE_PROVIDER_SITE_OTHER): Payer: 59 | Admitting: Neurology

## 2014-11-20 VITALS — BP 162/97 | HR 79 | Resp 24 | Ht 72.5 in | Wt 240.0 lb

## 2014-11-20 DIAGNOSIS — J309 Allergic rhinitis, unspecified: Secondary | ICD-10-CM

## 2014-11-20 DIAGNOSIS — J441 Chronic obstructive pulmonary disease with (acute) exacerbation: Secondary | ICD-10-CM

## 2014-11-20 DIAGNOSIS — R0683 Snoring: Secondary | ICD-10-CM

## 2014-11-20 DIAGNOSIS — G4730 Sleep apnea, unspecified: Secondary | ICD-10-CM

## 2014-11-20 MED ORDER — TRIAMCINOLONE ACETONIDE 55 MCG/ACT NA AERO: 2.0000 | INHALATION_SPRAY | Freq: Every day | NASAL | Status: DC

## 2014-11-20 NOTE — Patient Instructions (Signed)
Sleep Apnea  Sleep apnea is disorder that affects a person's sleep. A person with sleep apnea has abnormal pauses in their breathing when they sleep. It is hard for them to get a good sleep. This makes a person tired during the day. It also can lead to other physical problems. There are three types of sleep apnea. One type is when breathing stops for a short time because your airway is blocked (obstructive sleep apnea). Another type is when the brain sometimes fails to give the normal signal to breathe to the muscles that control your breathing (central sleep apnea). The third type is a combination of the other two types.  HOME CARE  · Do not sleep on your back. Try to sleep on your side.  · Take all medicine as told by your doctor.  · Avoid alcohol, calming medicines (sedatives), and depressant drugs.  · Try to lose weight if you are overweight. Talk to your doctor about a healthy weight goal.  Your doctor may have you use a device that helps to open your airway. It can help you get the air that you need. It is called a positive airway pressure (PAP) device. There are three types of PAP devices:  · Continuous positive airway pressure (CPAP) device.  · Nasal expiratory positive airway pressure (EPAP) device.  · Bilevel positive airway pressure (BPAP) device.  MAKE SURE YOU:  · Understand these instructions.  · Will watch your condition.  · Will get help right away if you are not doing well or get worse.  Document Released: 09/16/2008 Document Revised: 11/24/2012 Document Reviewed: 04/10/2012  ExitCare® Patient Information ©2015 [employer]. This information is not intended to replace advice given to you by your health care provider. Make sure you discuss any questions you have with your health care provider.

## 2014-11-20 NOTE — Progress Notes (Signed)
SLEEP MEDICINE CLINIC   Provider:  Melvyn Novas, M D  Referring Provider: Ralene Ok, MD Primary Care Physician:  Ralene Ok, MD  Chief Complaint  Patient presents with  . NP Tim Thomas sleep consult    Rm 11, alone    HPI:  Tim Thomas is a 63 y.o.  right handed male , seen here as a referral from Tim Thomas for a sleep apnea evaluation,  Dear Tim Thomas,  Thank you for allowing me to see your patient Tim Thomas who presented today to our sleep clinic. As you know Tim Thomas has a history of asthma and recurrent bronchitis and he actually right now has a cold and tachypnea as he presents to the office. The patient's body mass index is elevated  (he weighs 240 pounds at 5 foot 11) and he has a history of hypertension-COPD.  He reports waking up from shortness of breath, gasping and his wife reported him snoring loudly. He is having apneas per his report. He has GERD , too.  He reports being poorly refreshed after his night time sleep and able to sleep any time any place. He has to force himself to do things around the house. 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 He was working night and day. He retired in 2003. Remote shift work history.   He tends to go to bed by 7.30 PM after supper, he watches TV and dozes quickly  off until 11 PM, wakes up and goes to the bathroom , again watches TV and dozes off , wakes up again at 4 AM and by 6 AM is woken by his wife to bring their handicapped daughter to her school. Total sleep time over 11 hours is about 6 hours.  He reports that within a few hours after falling asleep he is waking again, his TV habits are not helping. He wakes up from not breathing, he insists.   He had a tonsillectomy in childhood, no other neck or nasal , airway surgery. He had a neck evaluation with Dr. Jeral Thomas, who did initiate PT.  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 Regular since age 68.  Rare caffeine user.   He will need a split study with CO2 measures, and was explained what this entailed.    Review of Systems: Out of a complete 14 system review, the patient complains of only the following symptoms, and all other reviewed systems are negative.  Epworth 9 to 12 points, fatigue  SS at 48 points.  Geriatric depression score 2.    History   Social History  . Marital Status: Married    Spouse Name: N/A    Number of Children: N/A  . Years of Education: N/A   Occupational History  . retired Unemployed   Social History Main Topics  . Smoking status: Current Every Day Smoker -- 1.00 packs/day for 46 years    Types: Cigarettes  . Smokeless tobacco: Never Used  . Alcohol Use: 0.0 oz/week    0 Not specified per week     Comment: 1 pint weekly-2 weeks  . Drug Use: No  . Sexual Activity: Not on file   Other Topics Concern  . Not on file   Social History Narrative   Right handed.  Caffeine 5 cups avg daily.  Married, 4 kids.  Retired.  AD police science.    Family History  Problem  Relation Age of Onset  . Lung cancer Mother   . COPD Mother   . Thyroid disease Sister     Past Medical History  Diagnosis Date  . Hypertension   . High cholesterol   . Wet senile macular degeneration        . 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 Left ring finger surgery    . Tonsillectomy      Current Outpatient Prescriptions  Medication Sig Dispense Refill  . albuterol (PROAIR HFA) 108 (90 BASE) MCG/ACT inhaler Inhale 2 puffs into the lungs every 6 (six) hours as needed for wheezing. 1 Inhaler 6  . amLODipine (NORVASC) 5 MG tablet Take 5 mg by mouth daily.    . beclomethasone (QVAR) 80 MCG/ACT inhaler Inhale 1 puff into the lungs as needed.    Marland Kitchen. EPINEPHrine (EPIPEN) 0.3 mg/0.3 mL SOAJ injection Inject 0.3 mLs (0.3 mg total) into the muscle as needed. 1 Device 1  . NEVANAC 0.1 % ophthalmic suspension   12  . rosuvastatin  (CRESTOR) 20 MG tablet Take 20 mg by mouth 2 (two) times daily as needed.     Marland Kitchen. VIGAMOX 0.5 % ophthalmic solution   12   No current facility-administered medications for this visit.    Allergies as of 11/20/2014 - Review Complete 11/20/2014  Allergen Reaction Noted  . Ivp dye [iodinated diagnostic agents]  04/28/2012    Vitals: BP 162/97 mmHg  Pulse 79  Resp 24  Ht 6' 0.5" (1.842 m)  Wt 240 lb (108.863 kg)  BMI 32.08 kg/m2 Last Weight:  Wt Readings from Last 1 Encounters:  11/20/14 240 lb (108.863 kg)       Last Height:   Ht Readings from Last 1 Encounters:  11/20/14 6' 0.5" (1.842 m)    Physical exam:  General: The patient is awake, alert and appears not in acute distress. The patient is well groomed. Head: Normocephalic, atraumatic. Neck is supple. Mallampati 3 ,  neck circumference: 17.5 inches.  . Nasal airflow restricted, congested , TMJ is not  evident . Retrognathia is not seen.  Cardiovascular:  Regular rate and rhythm, without  murmurs or carotid bruit, and without distended neck veins. Respiratory: Lungs are clear to auscultation. Skin:  Without evidence of edema, or rash Trunk: BMI is  elevated and patient  has normal posture.  Neurologic exam : The patient is awake and alert, oriented to place and time.   Memory subjective  described as intact.  There is a normal attention span & concentration ability. Speech is fluent with dysphonia  Mood and affect are appropriate.  Cranial nerves: Pupils are equal and briskly reactive to light. Funduscopic exam without  evidence of pallor.  Right eye : reported vision distortion in the center of visual field-  Wet macula degeneration. .  Extraocular movements in vertical and horizontal planes intact and without nystagmus.  Visual fields by finger perimetry are intact. Hearing to finger rub intact.  Facial sensation intact to fine touch. Facial motor strength is symmetric and tongue and uvula move midline.  Motor exam:    Normal tone, muscle bulk and symmetric,strength in all extremities. Reports waking up with shoulder pain and numb arms.   Sensory:  Fine touch, pinprick and vibration were tested in all extremities. Proprioception is tested in the upper extremities only. This was normal.  Coordination: Rapid alternating movements in the fingers/hands is normal. Finger-to-nose maneuver  normal without evidence of  ataxia, dysmetria or tremor.  Gait and station: Patient walks without assistive device and is able unassisted to climb up to the exam table. Strength within normal limits. Stance is stable and normal. Deep tendon reflexes: in the  upper and lower extremities are symmetric and intact. Babinski maneuver response is downgoing.   Assessment:  After physical and neurologic examination, review of laboratory studies, imaging, neurophysiology testing and pre-existing records, assessment is   1) patient with bells palsy, HTN and tachypnoea.  2) COPD worsening with a cold,  Waking up from apnoeic spells. CO2 needed. AHI 20 to SPLIT and score at 45 for [facility] criteria. May need oxygen supplementation . Will have study in January as he has to cure his cold first.     The patient was advised of the nature of the diagnosed sleep disorder , the treatment options and risks for general a health and wellness arising from not treating the condition. Visit duration was 45  minutes.   Plan:  Treatment plan and additional workup : SPLIT      Porfirio Mylararmen Alazae Crymes MD  11/20/2014

## 2014-11-22 ENCOUNTER — Encounter (INDEPENDENT_AMBULATORY_CARE_PROVIDER_SITE_OTHER): Payer: 59 | Admitting: Ophthalmology

## 2014-11-22 DIAGNOSIS — H35033 Hypertensive retinopathy, bilateral: Secondary | ICD-10-CM

## 2014-11-22 DIAGNOSIS — H3532 Exudative age-related macular degeneration: Secondary | ICD-10-CM

## 2014-11-22 DIAGNOSIS — H43813 Vitreous degeneration, bilateral: Secondary | ICD-10-CM

## 2014-11-22 DIAGNOSIS — H3531 Nonexudative age-related macular degeneration: Secondary | ICD-10-CM

## 2014-11-22 DIAGNOSIS — I10 Essential (primary) hypertension: Secondary | ICD-10-CM

## 2014-12-26 ENCOUNTER — Ambulatory Visit (INDEPENDENT_AMBULATORY_CARE_PROVIDER_SITE_OTHER): Payer: 59 | Admitting: Neurology

## 2014-12-26 VITALS — BP 145/91

## 2014-12-26 DIAGNOSIS — G4730 Sleep apnea, unspecified: Secondary | ICD-10-CM

## 2014-12-26 DIAGNOSIS — J309 Allergic rhinitis, unspecified: Secondary | ICD-10-CM

## 2014-12-26 DIAGNOSIS — J441 Chronic obstructive pulmonary disease with (acute) exacerbation: Secondary | ICD-10-CM

## 2014-12-26 DIAGNOSIS — R0683 Snoring: Secondary | ICD-10-CM

## 2014-12-26 NOTE — Sleep Study (Signed)
Please see the scanned sleep study interpretation located in the Procedure tab within the Chart Review section. 

## 2015-01-09 ENCOUNTER — Telehealth: Payer: Self-pay | Admitting: *Deleted

## 2015-01-09 ENCOUNTER — Other Ambulatory Visit: Payer: Self-pay | Admitting: Neurology

## 2015-01-09 ENCOUNTER — Encounter: Payer: Self-pay | Admitting: Neurology

## 2015-01-09 DIAGNOSIS — G4710 Hypersomnia, unspecified: Secondary | ICD-10-CM

## 2015-01-09 NOTE — Telephone Encounter (Signed)
Patient was contacted and informed that his recent sleep study did not reveal any significant sleep disordered breathing.  Patient was informed that a follow up appointment here with Dr. Vickey Hugerohmeier was optional but that he should continue to follow up with his PCP.  Dr. Ralene Okoy Moreira was routed a copy of the results.  The patient gave verbal permission to mail a copy of his test results.

## 2015-03-28 ENCOUNTER — Encounter (INDEPENDENT_AMBULATORY_CARE_PROVIDER_SITE_OTHER): Payer: 59 | Admitting: Ophthalmology

## 2015-03-28 DIAGNOSIS — H3532 Exudative age-related macular degeneration: Secondary | ICD-10-CM

## 2015-03-28 DIAGNOSIS — H43813 Vitreous degeneration, bilateral: Secondary | ICD-10-CM | POA: Diagnosis not present

## 2015-03-28 DIAGNOSIS — I10 Essential (primary) hypertension: Secondary | ICD-10-CM | POA: Diagnosis not present

## 2015-03-28 DIAGNOSIS — H35033 Hypertensive retinopathy, bilateral: Secondary | ICD-10-CM | POA: Diagnosis not present

## 2015-03-28 DIAGNOSIS — H3531 Nonexudative age-related macular degeneration: Secondary | ICD-10-CM | POA: Diagnosis not present

## 2015-07-31 ENCOUNTER — Other Ambulatory Visit: Payer: Self-pay | Admitting: Internal Medicine

## 2015-07-31 DIAGNOSIS — R1084 Generalized abdominal pain: Secondary | ICD-10-CM

## 2015-08-02 ENCOUNTER — Inpatient Hospital Stay: Admission: RE | Admit: 2015-08-02 | Payer: Self-pay | Source: Ambulatory Visit

## 2015-08-02 ENCOUNTER — Ambulatory Visit
Admission: RE | Admit: 2015-08-02 | Discharge: 2015-08-02 | Disposition: A | Discharge status: Home / Self Care | Payer: Commercial Managed Care - HMO | Source: Ambulatory Visit | Attending: Internal Medicine | Admitting: Internal Medicine

## 2015-08-02 DIAGNOSIS — R1084 Generalized abdominal pain: Secondary | ICD-10-CM

## 2015-08-02 MED ORDER — IOPAMIDOL (ISOVUE-300) INJECTION 61%: 125.0000 mL | Freq: Once | INTRAVENOUS | Status: DC | PRN

## 2015-08-05 ENCOUNTER — Encounter (HOSPITAL_COMMUNITY): Payer: Self-pay | Admitting: Emergency Medicine

## 2015-08-05 ENCOUNTER — Emergency Department (INDEPENDENT_AMBULATORY_CARE_PROVIDER_SITE_OTHER)
Admission: EM | Admit: 2015-08-05 | Discharge: 2015-08-05 | Disposition: A | Discharge status: Home / Self Care | Payer: Commercial Managed Care - HMO | Source: Home / Self Care | Attending: Family Medicine | Admitting: Family Medicine

## 2015-08-05 DIAGNOSIS — T63441A Toxic effect of venom of bees, accidental (unintentional), initial encounter: Secondary | ICD-10-CM | POA: Diagnosis not present

## 2015-08-05 MED ORDER — EPINEPHRINE 0.3 MG/0.3ML IJ SOAJ: 0.3000 mg | Freq: Once | INTRAMUSCULAR | Status: AC

## 2015-08-05 MED ORDER — METHYLPREDNISOLONE SODIUM SUCC 125 MG IJ SOLR
INTRAMUSCULAR | Status: AC
  Filled 2015-08-05: qty 2

## 2015-08-05 MED ORDER — METHYLPREDNISOLONE SODIUM SUCC 125 MG IJ SOLR
125.0000 mg | Freq: Once | INTRAMUSCULAR | Status: AC
  Administered 2015-08-05: 125 mg via INTRAMUSCULAR

## 2015-08-05 NOTE — ED Notes (Signed)
Report bee sting that occurred around 4:00 pm today.  Site of sting is his forehead.  initailly felt like "needles" or "prickly" feeling to tongue and lips.  This feeling has resolved.

## 2015-08-05 NOTE — Discharge Instructions (Signed)
Take  of benadryl at bedtime, use epi pen as needed, see your doctor or return to ER if any problems.

## 2015-08-05 NOTE — ED Provider Notes (Signed)
CSN: [id number]     Arrival date & time 08/05/15  1727 History   First MD Initiated Contact with Patient 08/05/15 1739     Chief Complaint  Patient presents with  . Insect Bite   (Consider location/radiation/quality/duration/timing/severity/associated sxs/prior Treatment) Patient is a 64 y.o. male presenting with rash. The history is provided by the patient and the spouse.  Rash Location:  Face Facial rash location:  Forehead Quality: painful and swelling   Onset quality:  Sudden Duration:  2 hours Progression:  Unchanged Chronicity:  New Context: insect bite/sting   Context comment:  Stung by honeybee to forehead bridge of nose area, local sx of swelling, no systemic sx., has had allergic rxn in past. here for eval, is stable. Relieved by:  Antihistamines Associated symptoms: no fever, no hoarse voice, no shortness of breath, no sore throat, no throat swelling, no tongue swelling and not wheezing     Past Medical History  Diagnosis Date  . Hypertension   . High cholesterol   . Wet senile macular degeneration        . 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 Left ring finger surgery    . Tonsillectomy     Family History  Problem Relation Age of Onset  . Lung cancer Mother   . COPD Mother   . Thyroid disease Sister    Social History  Substance Use Topics  . Smoking status: Current Every Day Smoker -- 1.00 packs/day for 46 years    Types: Cigarettes  . Smokeless tobacco: Never Used  . Alcohol Use: 0.0 oz/week    0 Standard drinks or equivalent per week     Comment: 1 pint weekly-2 weeks    Review of Systems  Constitutional: Negative.  Negative for fever.  HENT: Positive for facial swelling. Negative for hoarse voice and sore throat.   Eyes: Negative.   Respiratory: Negative for shortness of breath and wheezing.   Cardiovascular: Negative.   Gastrointestinal: Negative.   Skin: Positive for rash.    Allergies  Ivp  dye  Home Medications   Prior to Admission medications   Medication Sig Start Date End Date Taking? Authorizing Provider  diphenhydrAMINE (SOMINEX) 25 MG tablet Take 50 mg by mouth at bedtime as needed for sleep.   Yes Historical Provider, MD  albuterol (PROAIR HFA) 108 (90 BASE) MCG/ACT inhaler Inhale 2 puffs into the lungs every 6 (six) hours as needed for wheezing. 05/20/12 07/26/14  Coralyn Helling, MD  amLODipine (NORVASC) 5 MG tablet Take 5 mg by mouth daily.    Historical Provider, MD  beclomethasone (QVAR) 80 MCG/ACT inhaler Inhale 1 puff into the lungs as needed.    Historical Provider, MD  EPINEPHrine 0.3 mg/0.3 mL IJ SOAJ injection Inject 0.3 mLs (0.3 mg total) into the muscle once. 08/05/15   Linna Hoff, MD  NEVANAC 0.1 % ophthalmic suspension  08/16/14   Historical Provider, MD  rosuvastatin (CRESTOR) 20 MG tablet Take 20 mg by mouth 2 (two) times daily as needed.     Historical Provider, MD  triamcinolone (NASACORT AQ) 55 MCG/ACT AERO nasal inhaler Place 2 sprays into the nose daily. 11/20/14   Porfirio Mylar Dohmeier, MD  VIGAMOX 0.5 % ophthalmic solution  08/16/14   Historical Provider, MD   BP 143/91 mmHg  Pulse 84  Temp(Src) 98.1 F (36.7 C) (Oral)  Resp 18  SpO2 98% Physical Exam  Constitutional: He is oriented to person, place, and  time. He appears well-developed and well-nourished. No distress.  HENT:  sts to bridge of nose locally, no other rash, itching or sts  Eyes: Conjunctivae are normal. Pupils are equal, round, and reactive to light.  Neck: Normal range of motion. Neck supple.  Pulmonary/Chest: Effort normal and breath sounds normal.  Neurological: He is alert and oriented to person, place, and time.  Skin: Skin is warm and dry.  Nursing note and vitals reviewed.   ED Course  Procedures (including critical care time) Labs Review Labs Reviewed - No data to display  Imaging Review No results found.   MDM   1. Bee sting, accidental or unintentional, initial  encounter        Linna Hoff, MD 08/05/15 [phone]

## 2015-08-08 ENCOUNTER — Encounter (INDEPENDENT_AMBULATORY_CARE_PROVIDER_SITE_OTHER): Payer: 59 | Admitting: Ophthalmology

## 2015-08-08 DIAGNOSIS — H35033 Hypertensive retinopathy, bilateral: Secondary | ICD-10-CM | POA: Diagnosis not present

## 2015-08-08 DIAGNOSIS — H3531 Nonexudative age-related macular degeneration: Secondary | ICD-10-CM | POA: Diagnosis not present

## 2015-08-08 DIAGNOSIS — H3532 Exudative age-related macular degeneration: Secondary | ICD-10-CM

## 2015-08-08 DIAGNOSIS — H43813 Vitreous degeneration, bilateral: Secondary | ICD-10-CM

## 2015-08-08 DIAGNOSIS — I10 Essential (primary) hypertension: Secondary | ICD-10-CM

## 2015-08-17 ENCOUNTER — Other Ambulatory Visit: Payer: Self-pay | Admitting: Pulmonary Disease

## 2015-08-20 ENCOUNTER — Ambulatory Visit (INDEPENDENT_AMBULATORY_CARE_PROVIDER_SITE_OTHER): Payer: Commercial Managed Care - HMO | Admitting: Adult Health

## 2015-08-20 ENCOUNTER — Encounter: Payer: Self-pay | Admitting: Adult Health

## 2015-08-20 VITALS — BP 118/74 | HR 62 | Temp 98.2°F | Ht 71.0 in | Wt 231.0 lb

## 2015-08-20 DIAGNOSIS — J449 Chronic obstructive pulmonary disease, unspecified: Secondary | ICD-10-CM | POA: Diagnosis not present

## 2015-08-20 DIAGNOSIS — Z720 Tobacco use: Secondary | ICD-10-CM | POA: Diagnosis not present

## 2015-08-20 NOTE — Progress Notes (Signed)
   Subjective:    Patient ID: Tim Thomas, male    DOB: Mar 31, 1951, 64 y.o.   MRN: [id number]  HPI 64 yo male smoker with COPD with asthma. .  TESTS: PFT 05/12/12>>FEV1 3.37(99%), FEV1% 68, TLC 7.33(103%), DLCO 116%, +BD   08/20/2015 Follow up : COPD with asthma  Pt returns for a follow up .  Last seen in clinic in 2014. Says he is doing okay , follows up with PCP .  Needs refill of inhalers . Does not use QVAR everyday.  Rare use of SABA .  Last cxr 11/2013 with NAD.  Spirometry today shows FEV1 105%, ratio 94%, FVC 111%.  Still smokes, discussed cessation .  PVX 2009.  He says he remains active with yard work.  No fever, chest pain, orthopnea, edema .     Review of Systems Constitutional:   No  weight loss, night sweats,  Fevers, chills, fatigue, or  lassitude.  HEENT:   No headaches,  Difficulty swallowing,  Tooth/dental problems, or  Sore throat,                No sneezing, itching, ear ache,  +nasal congestion, post nasal drip,   CV:  No chest pain,  Orthopnea, PND, swelling in lower extremities, anasarca, dizziness, palpitations, syncope.   GI  No heartburn, indigestion, abdominal pain, nausea, vomiting, diarrhea, change in bowel habits, loss of appetite, bloody stools.   Resp:    No chest wall deformity  Skin: no rash or lesions.  GU: no dysuria, change in color of urine, no urgency or frequency.  No flank pain, no hematuria   MS:  No joint pain or swelling.  No decreased range of motion.  No back pain.  Psych:  No change in mood or affect. No depression or anxiety.  No memory loss.         Objective:   Physical Exam GEN: A/Ox3; pleasant , NAD, well nourished   HEENT:  [place]/AT,  EACs-clear, TMs-wnl, NOSE-clear, THROAT-clear, no lesions, no postnasal drip or exudate noted.   NECK:  Supple w/ fair ROM; no JVD; normal carotid impulses w/o bruits; no thyromegaly or nodules palpated; no lymphadenopathy.  RESP  Clear  P & A; w/o, wheezes/ rales/ or rhonchi.no  accessory muscle use, no dullness to percussion  CARD:  RRR, no m/r/g  , no peripheral edema, pulses intact, no cyanosis or clubbing.  GI:   Soft & nt; nml bowel sounds; no organomegaly or masses detected.  Musco: Warm bil, no deformities or joint swelling noted.   Neuro: alert, no focal deficits noted.    Skin: Warm, no lesions or rashes         Assessment & Plan:

## 2015-08-20 NOTE — Patient Instructions (Signed)
Use  QVAR 2 puffs Twice daily   Use ProAir inhaler as needed -rescue inhaler  Fllow up Dr. Craige Cotta  In 6 months and As needed

## 2015-08-20 NOTE — Assessment & Plan Note (Signed)
Preserved lung function despite heavy smoking  Smoking cessation discussed   Plan  Use  QVAR 2 puffs Twice daily   Use ProAir inhaler as needed -rescue inhaler  Fllow up Dr. Craige Cotta  In 6 months and As needed

## 2015-08-20 NOTE — Assessment & Plan Note (Signed)
Smoking cessation  

## 2015-08-21 ENCOUNTER — Telehealth: Payer: Self-pay | Admitting: Adult Health

## 2015-08-21 DIAGNOSIS — J449 Chronic obstructive pulmonary disease, unspecified: Secondary | ICD-10-CM

## 2015-08-21 MED ORDER — BECLOMETHASONE DIPROPIONATE 80 MCG/ACT IN AERS: 1.0000 | INHALATION_SPRAY | RESPIRATORY_TRACT | Status: DC | PRN

## 2015-08-21 MED ORDER — ALBUTEROL SULFATE HFA 108 (90 BASE) MCG/ACT IN AERS: 2.0000 | INHALATION_SPRAY | Freq: Four times a day (QID) | RESPIRATORY_TRACT | Status: DC | PRN

## 2015-08-21 NOTE — Telephone Encounter (Signed)
per TP's OV on 08/20/15 Plan  Use QVAR 2 puffs Twice daily  Use ProAir inhaler as needed -rescue inhaler  Fllow up Dr. Craige Cotta In 6 months and As needed  Returned pt's call to verify pharmacy Refilled Qvar and albuterol inhalers and sent them to [facility] in [place] per pt's request Pt voiced understanding and had no further questions Nothing further needed.

## 2015-08-21 NOTE — Progress Notes (Signed)
Reviewed and agree with assessment/plan. 

## 2015-10-01 ENCOUNTER — Ambulatory Visit
Admission: RE | Admit: 2015-10-01 | Discharge: 2015-10-01 | Disposition: A | Discharge status: Home / Self Care | Payer: Commercial Managed Care - HMO | Source: Ambulatory Visit | Attending: Gastroenterology | Admitting: Gastroenterology

## 2015-10-01 ENCOUNTER — Other Ambulatory Visit: Payer: Self-pay | Admitting: Gastroenterology

## 2015-10-01 DIAGNOSIS — R1030 Lower abdominal pain, unspecified: Secondary | ICD-10-CM

## 2015-10-07 ENCOUNTER — Emergency Department (HOSPITAL_COMMUNITY): Payer: Medicare Other

## 2015-10-07 ENCOUNTER — Emergency Department (HOSPITAL_COMMUNITY)
Admission: EM | Admit: 2015-10-07 | Discharge: 2015-10-08 | Disposition: A | Discharge status: Home / Self Care | Payer: Medicare Other | Attending: Emergency Medicine | Admitting: Emergency Medicine

## 2015-10-07 ENCOUNTER — Encounter (HOSPITAL_COMMUNITY): Payer: Self-pay

## 2015-10-07 DIAGNOSIS — R1084 Generalized abdominal pain: Secondary | ICD-10-CM | POA: Diagnosis not present

## 2015-10-07 DIAGNOSIS — Z720 Tobacco use: Secondary | ICD-10-CM | POA: Diagnosis not present

## 2015-10-07 DIAGNOSIS — I10 Essential (primary) hypertension: Secondary | ICD-10-CM | POA: Insufficient documentation

## 2015-10-07 DIAGNOSIS — J449 Chronic obstructive pulmonary disease, unspecified: Secondary | ICD-10-CM | POA: Insufficient documentation

## 2015-10-07 DIAGNOSIS — R1030 Lower abdominal pain, unspecified: Secondary | ICD-10-CM | POA: Diagnosis present

## 2015-10-07 DIAGNOSIS — Z79899 Other long term (current) drug therapy: Secondary | ICD-10-CM | POA: Diagnosis not present

## 2015-10-07 DIAGNOSIS — E782 Mixed hyperlipidemia: Secondary | ICD-10-CM | POA: Diagnosis not present

## 2015-10-07 LAB — CBC
HCT: 45.4 % (ref 39.0–52.0)
Hemoglobin: 15.6 g/dL (ref 13.0–17.0)
MCH: 30.4 pg (ref 26.0–34.0)
MCHC: 34.4 g/dL (ref 30.0–36.0)
MCV: 88.5 fL (ref 78.0–100.0)
Platelets: 236 10*3/uL (ref 150–400)
RBC: 5.13 MIL/uL (ref 4.22–5.81)
RDW: 14.2 % (ref 11.5–15.5)
WBC: 9 10*3/uL (ref 4.0–10.5)

## 2015-10-07 LAB — COMPREHENSIVE METABOLIC PANEL
ALBUMIN: 4.3 g/dL (ref 3.5–5.0)
ALK PHOS: 78 U/L (ref 38–126)
ALT: 16 U/L — AB (ref 17–63)
AST: 18 U/L (ref 15–41)
Anion gap: 10 (ref 5–15)
BUN: 13 mg/dL (ref 6–20)
CALCIUM: 9.2 mg/dL (ref 8.9–10.3)
CHLORIDE: 101 mmol/L (ref 101–111)
CO2: 24 mmol/L (ref 22–32)
CREATININE: 1.3 mg/dL — AB (ref 0.61–1.24)
GFR calc Af Amer: >60 mL/min (ref >60)
GFR calc non Af Amer: 56 mL/min — ABNORMAL LOW (ref >60)
GLUCOSE: 103 mg/dL — AB (ref 65–99)
Potassium: 3.6 mmol/L (ref 3.5–5.1)
SODIUM: 135 mmol/L (ref 135–145)
Total Bilirubin: 0.8 mg/dL (ref 0.3–1.2)
Total Protein: 7.9 g/dL (ref 6.5–8.1)

## 2015-10-07 LAB — POC OCCULT BLOOD, ED: Fecal Occult Bld: NEGATIVE

## 2015-10-07 LAB — LIPASE, BLOOD: Lipase: 28 U/L (ref 22–51)

## 2015-10-07 MED ORDER — IOHEXOL 300 MG/ML  SOLN
25.0000 mL | Freq: Once | INTRAMUSCULAR | Status: DC | PRN
Start: 2015-10-07 — End: 2015-10-08

## 2015-10-07 MED ORDER — IOHEXOL 300 MG/ML  SOLN
25.0000 mL | Freq: Once | INTRAMUSCULAR | Status: AC | PRN
  Administered 2015-10-07: 25 mL via ORAL

## 2015-10-07 MED ORDER — IOHEXOL 300 MG/ML  SOLN
100.0000 mL | Freq: Once | INTRAMUSCULAR | Status: AC | PRN
  Administered 2015-10-08: 100 mL via INTRAVENOUS

## 2015-10-07 NOTE — ED Notes (Signed)
Urinal at bedside for urine sample.

## 2015-10-07 NOTE — ED Notes (Signed)
Pt has been having GI issues since July. Reports having a colonoscopy on 10/7. Last Monday pt began having worsening lower abdominal pain and tenderness to epigastric. Pain has become progressively worse since colonoscopy. Pt also reports urinary hesitancy

## 2015-10-07 NOTE — ED Notes (Signed)
Pt states he had a colonoscopy last Friday and on Monday he started having severe pain/bloating in lower abdomen. Seen by PCP and had xray but was told nothing was wrong at that time and was told to come to ED if he felt worse.

## 2015-10-07 NOTE — ED Notes (Signed)
Pt ambulatory to restroom with steady gait.

## 2015-10-08 ENCOUNTER — Encounter (HOSPITAL_COMMUNITY): Payer: Self-pay | Admitting: Radiology

## 2015-10-08 LAB — URINALYSIS, ROUTINE W REFLEX MICROSCOPIC
Bilirubin Urine: NEGATIVE
GLUCOSE, UA: NEGATIVE mg/dL
Ketones, ur: NEGATIVE mg/dL
LEUKOCYTES UA: NEGATIVE
Nitrite: NEGATIVE
PROTEIN: NEGATIVE mg/dL
SPECIFIC GRAVITY, URINE: 1.018 (ref 1.005–1.030)
UROBILINOGEN UA: 0.2 mg/dL (ref 0.0–1.0)
pH: 5.5 (ref 5.0–8.0)

## 2015-10-08 LAB — URINE MICROSCOPIC-ADD ON

## 2015-10-08 MED ORDER — HYDROCODONE-ACETAMINOPHEN 5-325 MG PO TABS: 1.0000 | ORAL_TABLET | ORAL | Status: DC | PRN

## 2015-10-08 MED ORDER — MORPHINE SULFATE (PF) 4 MG/ML IV SOLN
4.0000 mg | Freq: Once | INTRAVENOUS | Status: AC
  Administered 2015-10-08: 4 mg via INTRAVENOUS
  Filled 2015-10-08: qty 1

## 2015-10-08 MED ORDER — ONDANSETRON 8 MG PO TBDP: 8.0000 mg | ORAL_TABLET | Freq: Three times a day (TID) | ORAL | Status: DC | PRN

## 2015-10-08 NOTE — ED Notes (Signed)
Dr. Campos at bedside   

## 2015-10-08 NOTE — ED Notes (Signed)
Pt left with all belongings and ambulated out of treatment area with wife.  

## 2015-10-08 NOTE — ED Notes (Cosign Needed)
Pending CT scan  Dub MikesSamantha Tripp Dowless, PA-C 10/08/15 0122

## 2015-10-08 NOTE — Discharge Instructions (Signed)

## 2015-10-08 NOTE — ED Provider Notes (Signed)
CSN: [id number]     Arrival date & time 10/07/15  1926 History   First MD Initiated Contact with Patient 10/07/15 2032     Chief Complaint  Patient presents with  . Post-op Problem     (Consider location/radiation/quality/duration/timing/severity/associated sxs/prior Treatment) HPI  Tim Thomas is a 64 y.o M who presents to the Emergency Department today c/o abdominal pain. Patient states that he has been having nonspecific abdominal pain in his lower abdomen and pelvis for the last several months. Patient has seen multiple GI doctors and a urologist for these issues but has yet have relief or a clear answer to what is causing these issues patient underwent a colonoscopy a week ago for further evaluation of his abdominal pain. Patient states that since the colonoscopy on 10/7 his abdominal pain has become much worse and now has associated bloating and urinary hesitancy. His abdominal pain is diffusely located throughout his abdomen and does not radiate. Patient also reporting dark stools. Denies dysuria, hematuria Past Medical History  Diagnosis Date  . Hypertension   . High cholesterol   . Wet senile macular degeneration (HCC)        . 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 Left ring finger surgery    . Tonsillectomy     Family History  Problem Relation Age of Onset  . Lung cancer Mother   . COPD Mother   . Thyroid disease Sister    Social History  Substance Use Topics  . Smoking status: Current Every Day Smoker -- 1.00 packs/day for 46 years    Types: Cigarettes  . Smokeless tobacco: Never Used  . Alcohol Use: 0.0 oz/week    0 Standard drinks or equivalent per week     Comment: 1 pint weekly-2 weeks    Review of Systems  All other systems reviewed and are negative.     Allergies  Bee venom; Ciprofloxacin; and Ivp dye  Home Medications   Prior to Admission medications   Medication Sig Start Date End Date Taking?  Authorizing Provider  albuterol (PROAIR HFA) 108 (90 BASE) MCG/ACT inhaler Inhale 2 puffs into the lungs every 6 (six) hours as needed for wheezing. 08/21/15 11/20/18 Yes Tammy S Parrett, NP  amLODipine (NORVASC) 5 MG tablet Take 5 mg by mouth daily.   Yes Historical Provider, MD  beclomethasone (QVAR) 80 MCG/ACT inhaler Inhale 1 puff into the lungs as needed. Patient taking differently: Inhale 1 puff into the lungs as needed (for shortness of breath).  08/21/15  Yes Tammy S Parrett, NP  Linaclotide (LINZESS) 290 MCG CAPS capsule Take 290 mcg by mouth daily as needed (for constipation).    Yes Historical Provider, MD  rosuvastatin (CRESTOR) 20 MG tablet Take 20 mg by mouth daily.    Yes Historical Provider, MD  sulfamethoxazole-trimethoprim (BACTRIM,SEPTRA) 400-80 MG per tablet Take 1 tablet by mouth 2 (two) times daily. 08/14/15  Yes Historical Provider, MD  tamsulosin (FLOMAX) 0.4 MG CAPS capsule Take 1 capsule by mouth daily. 08/14/15  Yes Historical Provider, MD  EPINEPHrine 0.3 mg/0.3 mL IJ SOAJ injection Inject 0.3 mLs (0.3 mg total) into the muscle once. 08/05/15   Linna Hoff, MD  HYDROcodone-acetaminophen (NORCO/VICODIN) 5-325 MG tablet Take 1 tablet by mouth every 4 (four) hours as needed for moderate pain. 10/08/15   Azalia Bilis, MD  ondansetron (ZOFRAN ODT) 8 MG disintegrating tablet Take 1 tablet (8 mg total) by mouth every 8 (eight) hours  as needed for nausea or vomiting. 10/08/15   Azalia Bilis, MD  triamcinolone (NASACORT AQ) 55 MCG/ACT AERO nasal inhaler Place 2 sprays into the nose daily. 11/20/14   Carmen Dohmeier, MD   BP 139/74 mmHg  Pulse 69  Temp(Src) 99.2 F (37.3 C) (Oral)  Resp 16  Ht 5' 11.75" (1.822 m)  Wt 224 lb (101.606 kg)  BMI 30.61 kg/m2  SpO2 97% Physical Exam  Constitutional: He is oriented to person, place, and time. He appears well-developed and well-nourished. No distress.  HENT:  Head: Normocephalic and atraumatic.  Mouth/Throat: No oropharyngeal  exudate.  Eyes: Conjunctivae and EOM are normal. Pupils are equal, round, and reactive to light. Right eye exhibits no discharge. Left eye exhibits no discharge. No scleral icterus.  Neck: Normal range of motion. Neck supple.  Cardiovascular: Normal rate, regular rhythm, normal heart sounds and intact distal pulses.  Exam reveals no gallop and no friction rub.   No murmur heard. Pulmonary/Chest: Effort normal and breath sounds normal. No respiratory distress. He has no wheezes. He has no rales. He exhibits no tenderness.  Abdominal: Soft. Bowel sounds are normal. He exhibits no distension and no mass. There is tenderness ( diffusely TTP of entire abdomen). There is no rebound and no guarding.  Genitourinary: Rectum normal. Guaiac negative stool.  Musculoskeletal: Normal range of motion. He exhibits no edema.  Lymphadenopathy:    He has no cervical adenopathy.  Neurological: He is alert and oriented to person, place, and time. No cranial nerve deficit.  Strength 5/5 throughout. No sensory deficits.  No gait abnormality  Skin: Skin is warm and dry. No rash noted. He is not diaphoretic. No erythema. No pallor.  Psychiatric: He has a normal mood and affect. His behavior is normal.  Nursing note and vitals reviewed.   ED Course  Procedures (including critical care time) Labs Review Labs Reviewed  COMPREHENSIVE METABOLIC PANEL - Abnormal; Notable for the following:    Glucose, Bld 103 (*)    Creatinine, Ser 1.30 (*)    ALT 16 (*)    GFR calc non Af Amer 56 (*)    All other components within normal limits  URINALYSIS, ROUTINE W REFLEX MICROSCOPIC (NOT AT [facility]) - Abnormal; Notable for the following:    APPearance CLOUDY (*)    Hgb urine dipstick TRACE (*)    All other components within normal limits  LIPASE, BLOOD  CBC  URINE MICROSCOPIC-ADD ON  POC OCCULT BLOOD, ED    Imaging Review Ct Abdomen Pelvis W Contrast  10/08/2015  CLINICAL DATA:  Acute onset of severe lower abdominal pain  and bloating, after colonoscopy. Initial encounter. EXAM: CT ABDOMEN AND PELVIS WITH CONTRAST TECHNIQUE: Multidetector CT imaging of the abdomen and pelvis was performed using the standard protocol following bolus administration of intravenous contrast. CONTRAST:  OMNIPAQUE IOHEXOL 300 MG/ML  SOLN COMPARISON:  CT of the abdomen and pelvis performed 08/02/2015 FINDINGS: Minimal bibasilar atelectasis is noted. The liver and spleen are unremarkable in appearance. The gallbladder is within normal limits. The pancreas and adrenal glands are unremarkable. The kidneys are unremarkable in appearance. There is no evidence of hydronephrosis. No renal or ureteral stones are seen. Minimal nonspecific perinephric stranding is noted bilaterally. No free fluid is identified. The small bowel is unremarkable in appearance. The stomach is within normal limits. No acute vascular abnormalities are seen. Scattered calcification is noted along the abdominal aorta and its branches. There is mild nonspecific mesenteric haziness and mildly prominent mesenteric nodes.  This may reflect mild chronic inflammation, and is more prominent than on the prior study. The appendix is normal in caliber, without evidence of appendicitis. Scattered diverticulosis is noted along the mildly redundant sigmoid colon. The bladder is moderately distended and grossly unremarkable. The prostate is mildly enlarged, measuring 5.0 cm in transverse dimension. No inguinal lymphadenopathy is seen. No acute osseous abnormalities are identified. Vacuum phenomenon is noted at L5-S1. IMPRESSION: 1. No definite acute abnormality seen to explain the patient's symptoms. 2. Mild nonspecific mesenteric haziness and mildly prominent mesenteric nodes may reflect mild chronic inflammation, and is more prominent than on the prior CT. 3. Scattered diverticulosis along the sigmoid colon, without evidence of diverticulitis. 4. Scattered calcification along the abdominal aorta  and its branches. 5. Mildly enlarged prostate noted. Electronically Signed   By: Roanna RaiderJeffery  Chang M.D.   On: 10/08/2015 01:15   I have personally reviewed and evaluated these images and lab results as part of my medical decision-making.   EKG Interpretation None      MDM   Final diagnoses:  Generalized abdominal pain   Pt presents with increased abdominal pain 1 week after colonoscopy. Also complaining of dark-colored stools since colonoscopy. VSS. No obvious hematochezia or melena. No weakness or dyspnea on exertion.  All labs within normal limits. Hemoccult negative. Good rectal tone. Mild prostate enlargement felt on exam. UA negative for infection. CT revealed no acute abnormality. Patient has been seen by multiple specialists for ongoing abdominal pain. Do not suspect active bleed as hemoglobin is stable and patient is Hemoccult negative. Unlikely to be bowel perforation. Patient feels much improved after pain medications given.  Recommend that patient follow-up with gastroenterologist performed colonoscopy. Patient able to ambulate without difficulty. Return precautions outlined in patient discharge instructions.    Lester KinsmanSamantha Tripp [place], PA-C 10/08/15 1550  Rolan BuccoMelanie Belfi, MD 10/08/15 [phone]

## 2016-01-09 ENCOUNTER — Encounter (INDEPENDENT_AMBULATORY_CARE_PROVIDER_SITE_OTHER): Payer: Medicare Other | Admitting: Ophthalmology

## 2016-01-09 DIAGNOSIS — H35033 Hypertensive retinopathy, bilateral: Secondary | ICD-10-CM | POA: Diagnosis not present

## 2016-01-09 DIAGNOSIS — I10 Essential (primary) hypertension: Secondary | ICD-10-CM | POA: Diagnosis not present

## 2016-01-09 DIAGNOSIS — H353231 Exudative age-related macular degeneration, bilateral, with active choroidal neovascularization: Secondary | ICD-10-CM

## 2016-01-09 DIAGNOSIS — H43813 Vitreous degeneration, bilateral: Secondary | ICD-10-CM

## 2016-02-19 ENCOUNTER — Encounter: Payer: Self-pay | Admitting: Pulmonary Disease

## 2016-02-19 ENCOUNTER — Ambulatory Visit (INDEPENDENT_AMBULATORY_CARE_PROVIDER_SITE_OTHER): Payer: Medicare Other | Admitting: Pulmonary Disease

## 2016-02-19 VITALS — BP 138/68 | HR 78 | Ht 71.5 in | Wt 233.8 lb

## 2016-02-19 DIAGNOSIS — J45909 Unspecified asthma, uncomplicated: Secondary | ICD-10-CM | POA: Diagnosis not present

## 2016-02-19 DIAGNOSIS — F17210 Nicotine dependence, cigarettes, uncomplicated: Secondary | ICD-10-CM

## 2016-02-19 DIAGNOSIS — J449 Chronic obstructive pulmonary disease, unspecified: Secondary | ICD-10-CM

## 2016-02-19 DIAGNOSIS — Z720 Tobacco use: Secondary | ICD-10-CM

## 2016-02-19 NOTE — Patient Instructions (Signed)
Call if you decide to set up lung cancer screening program  Follow up in 1 year

## 2016-02-19 NOTE — Progress Notes (Signed)
Current Outpatient Prescriptions on File Prior to Visit  Medication Sig  . albuterol (PROAIR HFA) 108 (90 BASE) MCG/ACT inhaler Inhale 2 puffs into the lungs every 6 (six) hours as needed for wheezing.  Marland Kitchen amLODipine (NORVASC) 5 MG tablet Take 5 mg by mouth daily.  . beclomethasone (QVAR) 80 MCG/ACT inhaler Inhale 1 puff into the lungs as needed. (Patient taking differently: Inhale 1 puff into the lungs as needed (for shortness of breath). )  . EPINEPHrine 0.3 mg/0.3 mL IJ SOAJ injection Inject 0.3 mLs (0.3 mg total) into the muscle once.  . rosuvastatin (CRESTOR) 20 MG tablet Take 20 mg by mouth daily.   . tamsulosin (FLOMAX) 0.4 MG CAPS capsule Take 1 capsule by mouth daily.  Marland Kitchen triamcinolone (NASACORT AQ) 55 MCG/ACT AERO nasal inhaler Place 2 sprays into the nose daily.   No current facility-administered medications on file prior to visit.     Chief Complaint  Patient presents with  . Follow-up    pt. states breathing isbaseline. occ. dry cough sometimes prod. clear in color.  occ wheezing @ bedtime. denies SOB or chest pain/tightness.     Tests PFT 05/12/12>>FEV1 3.37(99%), FEV1% 68, TLC 7.33(103%), DLCO 116%, +BD Spirometry 08/20/15 >> FEV1 3.30 (105%), FEV1% 76  Past medical hx HTN, HLD  Past surgical hx, Allergies, Family hx, Social hx all reviewed.  Vital Signs BP 138/68 mmHg  Pulse 78  Ht 5' 11.5" (1.816 m)  Wt 233 lb 12.8 oz (106.051 kg)  BMI 32.16 kg/m2  SpO2 99%  History of Present Illness Tim Thomas is a 65 y.o. male smoker with COPD/asthma.  He continues to smoke about 1 pack per day.  He tried chantix >> bad dreams.  He has tries nicotine replacement.    He gets occasional cough and wheeze.  This happens more at night and in the morning.  He is not using inhalers since December >> doesn't feel like he needs them all the time.    Physical Exam  General - No distress ENT - No sinus tenderness, no oral exudate, no LAN Cardiac - s1s2 regular, no  murmur Chest - No wheeze/rales/dullness Back - No focal tenderness Abd - Soft, non-tender Ext - No edema Neuro - Normal strength Skin - No rashes Psych - normal mood, and behavior   Assessment/Plan  COPD asthma. Plan: - prn inhalers  Tobacco abuse. Plan: - discussed options to assist with smoking cessation  Lung cancer screening. Plan: - he will d/w his wife and then call if he wants to set up screen program    Patient Instructions  Call if you decide to set up lung cancer screening program  Follow up in 1 year     Coralyn Helling, MD [facility] Pager:  [phone]

## 2016-03-21 ENCOUNTER — Telehealth: Payer: Self-pay | Admitting: *Deleted

## 2016-03-21 NOTE — Telephone Encounter (Signed)
Submitted PA for Qvar thru [employer]. Key: [id number] Submitted for review.  [facility] (p) [phone]  (f) [phone].

## 2016-03-24 NOTE — Telephone Encounter (Signed)
Qvar has been denied. Insurance states alternatives are Arnuity, Flovent Diskus and Flovent HFA.

## 2016-03-24 NOTE — Telephone Encounter (Signed)
May change to Flovent HFA 2 puffs Twice daily  , rinse after use.  Please call pt.

## 2016-03-25 NOTE — Telephone Encounter (Signed)
lmtcb x1 for pt. 

## 2016-03-26 MED ORDER — FLUTICASONE PROPIONATE HFA 110 MCG/ACT IN AERO: 2.0000 | INHALATION_SPRAY | Freq: Two times a day (BID) | RESPIRATORY_TRACT | Status: DC

## 2016-03-26 NOTE — Telephone Encounter (Signed)
Called and spoke with pt. Reviewed TP's recs and verified pharmacy as [facility] [address]. He voiced understanding and had no further questions. Rx sent. Nothing further needed.

## 2016-04-17 ENCOUNTER — Encounter (INDEPENDENT_AMBULATORY_CARE_PROVIDER_SITE_OTHER): Payer: Medicare Other | Admitting: Ophthalmology

## 2016-04-17 DIAGNOSIS — H35033 Hypertensive retinopathy, bilateral: Secondary | ICD-10-CM | POA: Diagnosis not present

## 2016-04-17 DIAGNOSIS — I10 Essential (primary) hypertension: Secondary | ICD-10-CM | POA: Diagnosis not present

## 2016-04-17 DIAGNOSIS — H353231 Exudative age-related macular degeneration, bilateral, with active choroidal neovascularization: Secondary | ICD-10-CM | POA: Diagnosis not present

## 2016-04-17 DIAGNOSIS — H43813 Vitreous degeneration, bilateral: Secondary | ICD-10-CM | POA: Diagnosis not present

## 2016-04-21 ENCOUNTER — Encounter (INDEPENDENT_AMBULATORY_CARE_PROVIDER_SITE_OTHER): Payer: Medicare Other | Admitting: Ophthalmology

## 2016-04-30 ENCOUNTER — Encounter (INDEPENDENT_AMBULATORY_CARE_PROVIDER_SITE_OTHER): Payer: Medicare Other | Admitting: Ophthalmology

## 2016-05-28 ENCOUNTER — Encounter (INDEPENDENT_AMBULATORY_CARE_PROVIDER_SITE_OTHER): Payer: Medicare Other | Admitting: Ophthalmology

## 2016-05-28 DIAGNOSIS — H353231 Exudative age-related macular degeneration, bilateral, with active choroidal neovascularization: Secondary | ICD-10-CM | POA: Diagnosis not present

## 2016-05-28 DIAGNOSIS — I10 Essential (primary) hypertension: Secondary | ICD-10-CM | POA: Diagnosis not present

## 2016-05-28 DIAGNOSIS — H35033 Hypertensive retinopathy, bilateral: Secondary | ICD-10-CM | POA: Diagnosis not present

## 2016-05-28 DIAGNOSIS — H43813 Vitreous degeneration, bilateral: Secondary | ICD-10-CM

## 2016-08-15 ENCOUNTER — Other Ambulatory Visit: Payer: Self-pay | Admitting: Internal Medicine

## 2016-08-15 DIAGNOSIS — Z720 Tobacco use: Secondary | ICD-10-CM

## 2016-08-22 ENCOUNTER — Inpatient Hospital Stay: Admission: RE | Admit: 2016-08-22 | Payer: Medicare Other | Source: Ambulatory Visit

## 2016-08-27 ENCOUNTER — Encounter (INDEPENDENT_AMBULATORY_CARE_PROVIDER_SITE_OTHER): Payer: Medicare Other | Admitting: Ophthalmology

## 2016-08-27 DIAGNOSIS — H43813 Vitreous degeneration, bilateral: Secondary | ICD-10-CM

## 2016-08-27 DIAGNOSIS — H35033 Hypertensive retinopathy, bilateral: Secondary | ICD-10-CM

## 2016-08-27 DIAGNOSIS — H2513 Age-related nuclear cataract, bilateral: Secondary | ICD-10-CM | POA: Diagnosis not present

## 2016-08-27 DIAGNOSIS — H353231 Exudative age-related macular degeneration, bilateral, with active choroidal neovascularization: Secondary | ICD-10-CM | POA: Diagnosis not present

## 2016-08-27 DIAGNOSIS — I10 Essential (primary) hypertension: Secondary | ICD-10-CM | POA: Diagnosis not present

## 2016-09-01 ENCOUNTER — Ambulatory Visit
Admission: RE | Admit: 2016-09-01 | Discharge: 2016-09-01 | Disposition: A | Discharge status: Home / Self Care | Payer: Medicare Other | Source: Ambulatory Visit | Attending: Internal Medicine | Admitting: Internal Medicine

## 2016-09-01 DIAGNOSIS — Z720 Tobacco use: Secondary | ICD-10-CM

## 2016-10-22 ENCOUNTER — Encounter (INDEPENDENT_AMBULATORY_CARE_PROVIDER_SITE_OTHER): Payer: Medicare Other | Admitting: Ophthalmology

## 2016-10-22 DIAGNOSIS — H353231 Exudative age-related macular degeneration, bilateral, with active choroidal neovascularization: Secondary | ICD-10-CM

## 2016-10-22 DIAGNOSIS — H35033 Hypertensive retinopathy, bilateral: Secondary | ICD-10-CM

## 2016-10-22 DIAGNOSIS — H43813 Vitreous degeneration, bilateral: Secondary | ICD-10-CM

## 2016-10-22 DIAGNOSIS — I10 Essential (primary) hypertension: Secondary | ICD-10-CM | POA: Diagnosis not present

## 2016-12-31 ENCOUNTER — Encounter (INDEPENDENT_AMBULATORY_CARE_PROVIDER_SITE_OTHER): Payer: Medicare Other | Admitting: Ophthalmology

## 2016-12-31 DIAGNOSIS — H35033 Hypertensive retinopathy, bilateral: Secondary | ICD-10-CM

## 2016-12-31 DIAGNOSIS — H353231 Exudative age-related macular degeneration, bilateral, with active choroidal neovascularization: Secondary | ICD-10-CM

## 2016-12-31 DIAGNOSIS — H43813 Vitreous degeneration, bilateral: Secondary | ICD-10-CM

## 2016-12-31 DIAGNOSIS — I10 Essential (primary) hypertension: Secondary | ICD-10-CM | POA: Diagnosis not present

## 2017-03-11 ENCOUNTER — Encounter (INDEPENDENT_AMBULATORY_CARE_PROVIDER_SITE_OTHER): Payer: Medicare Other | Admitting: Ophthalmology

## 2017-03-12 ENCOUNTER — Encounter (INDEPENDENT_AMBULATORY_CARE_PROVIDER_SITE_OTHER): Payer: Medicare Other | Admitting: Ophthalmology

## 2017-03-12 DIAGNOSIS — H35033 Hypertensive retinopathy, bilateral: Secondary | ICD-10-CM

## 2017-03-12 DIAGNOSIS — H353231 Exudative age-related macular degeneration, bilateral, with active choroidal neovascularization: Secondary | ICD-10-CM | POA: Diagnosis not present

## 2017-03-12 DIAGNOSIS — I10 Essential (primary) hypertension: Secondary | ICD-10-CM

## 2017-03-12 DIAGNOSIS — H43813 Vitreous degeneration, bilateral: Secondary | ICD-10-CM

## 2017-03-12 DIAGNOSIS — H2513 Age-related nuclear cataract, bilateral: Secondary | ICD-10-CM | POA: Diagnosis not present

## 2017-03-13 ENCOUNTER — Encounter (INDEPENDENT_AMBULATORY_CARE_PROVIDER_SITE_OTHER): Payer: Medicare Other | Admitting: Ophthalmology

## 2017-04-30 ENCOUNTER — Encounter (INDEPENDENT_AMBULATORY_CARE_PROVIDER_SITE_OTHER): Payer: Medicare Other | Admitting: Ophthalmology

## 2017-04-30 DIAGNOSIS — H43813 Vitreous degeneration, bilateral: Secondary | ICD-10-CM

## 2017-04-30 DIAGNOSIS — H35033 Hypertensive retinopathy, bilateral: Secondary | ICD-10-CM | POA: Diagnosis not present

## 2017-04-30 DIAGNOSIS — H353231 Exudative age-related macular degeneration, bilateral, with active choroidal neovascularization: Secondary | ICD-10-CM

## 2017-04-30 DIAGNOSIS — I10 Essential (primary) hypertension: Secondary | ICD-10-CM

## 2017-04-30 DIAGNOSIS — H2513 Age-related nuclear cataract, bilateral: Secondary | ICD-10-CM | POA: Diagnosis not present

## 2017-05-20 ENCOUNTER — Encounter (INDEPENDENT_AMBULATORY_CARE_PROVIDER_SITE_OTHER): Payer: Medicare Other | Admitting: Ophthalmology

## 2017-06-09 IMAGING — CT CT ABD-PELV W/ CM
2 of 5 series · 16 of 46 positions shown, 18 images · IV contrast (APPLIED)
Comparison: 01/30/2006

CLINICAL DATA: Left lower quadrant pain for 3 months

EXAM:
CT ABDOMEN AND PELVIS WITH CONTRAST
TECHNIQUE: Multidetector CT imaging of the abdomen and pelvis was performed
using the standard protocol following bolus administration of
intravenous contrast.
CONTRAST:  125 cc Isovue

[Series 2: abd pelvis 5.0 i41s 1 · axial · 0.89mm/px · z∈[-482,-82]mm · 13 of 91 slices shown, 15 images]
[im 6/91  soft-tissue]
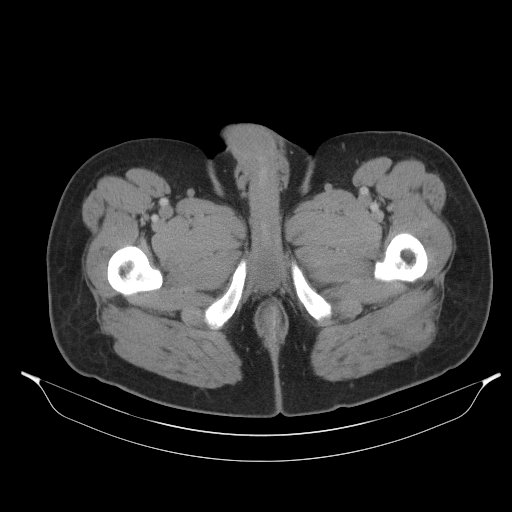
[im 6/91  bone]
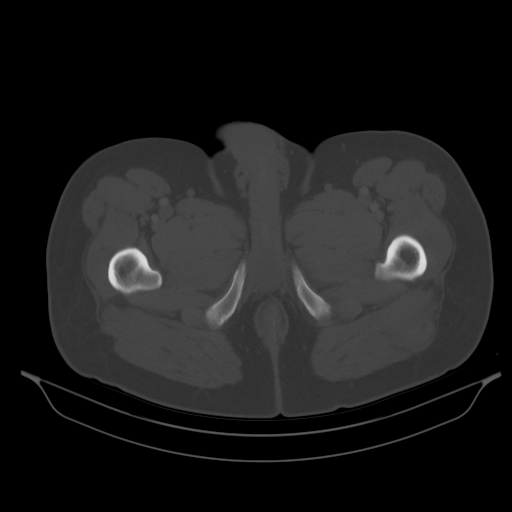
[im 11/91  soft-tissue]
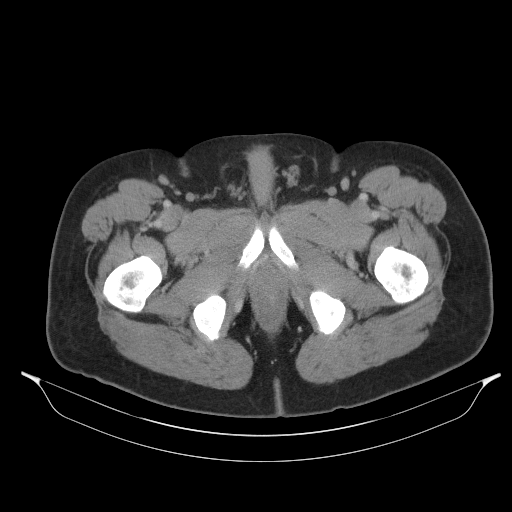
[im 21/91  soft-tissue]
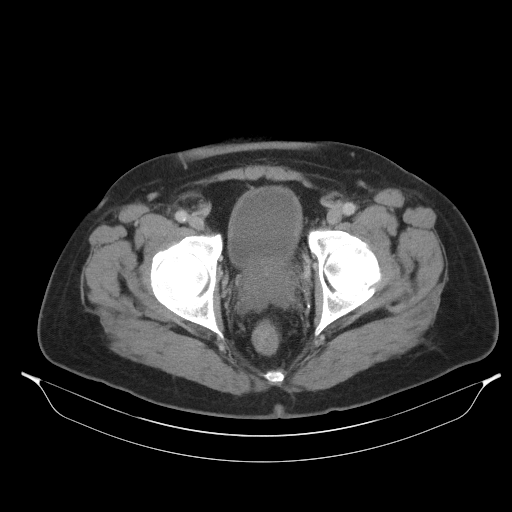
[im 26/91  soft-tissue]
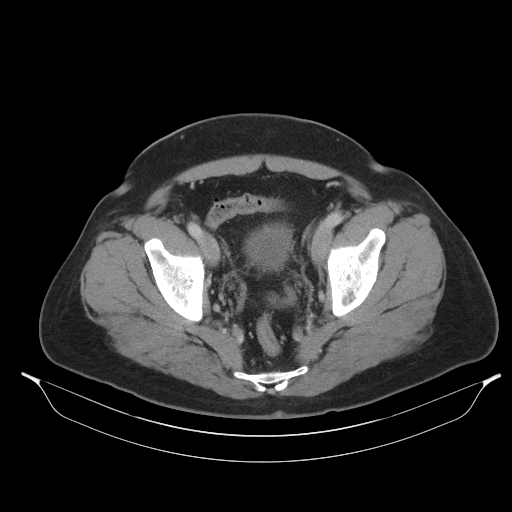
[im 31/91  soft-tissue]
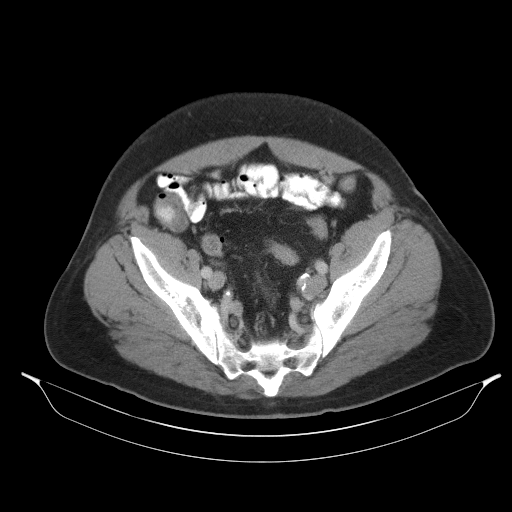
[im 41/91  soft-tissue]
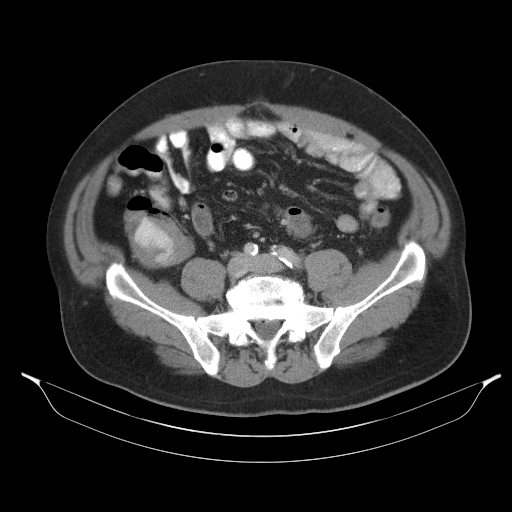
[im 46/91  soft-tissue]
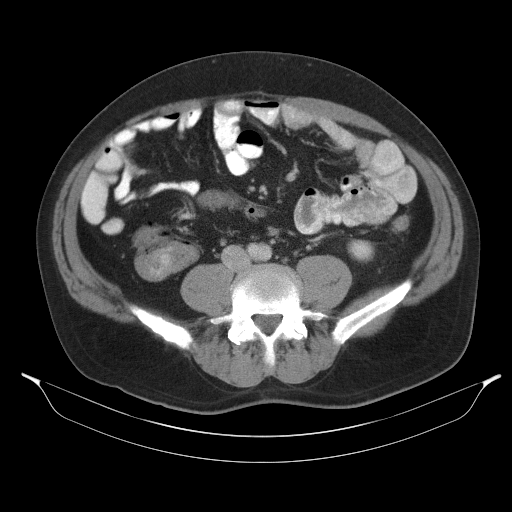
[im 51/91  soft-tissue]
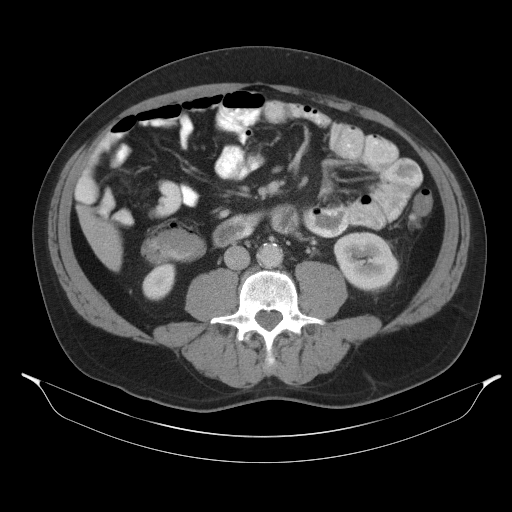
[im 61/91  soft-tissue]
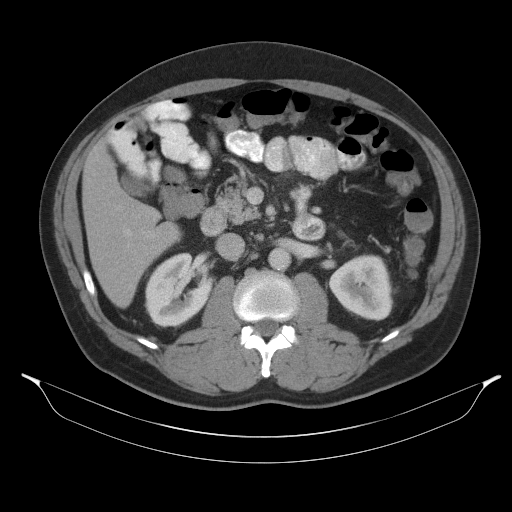
[im 61/91  bone]
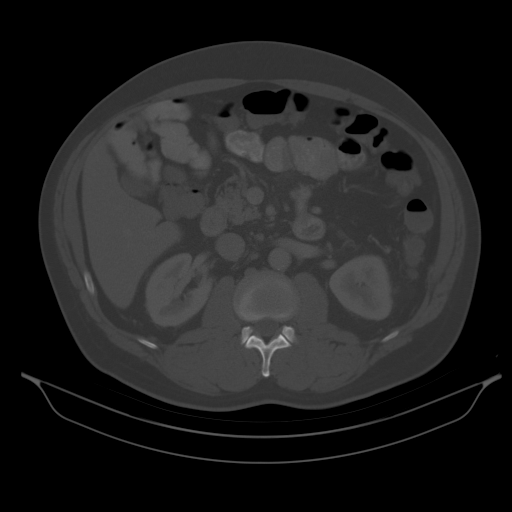
[im 66/91  soft-tissue]
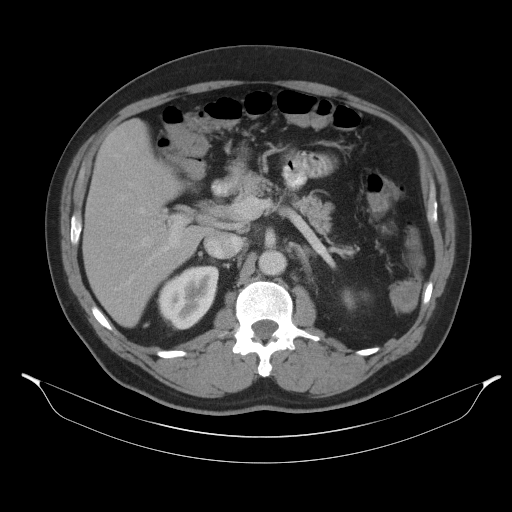
[im 71/91  soft-tissue]
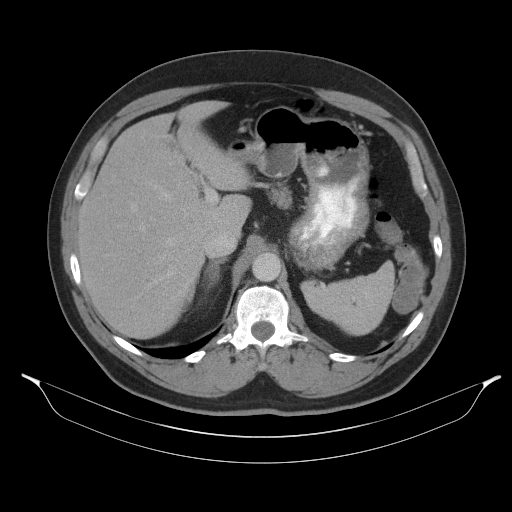
[im 81/91  soft-tissue]
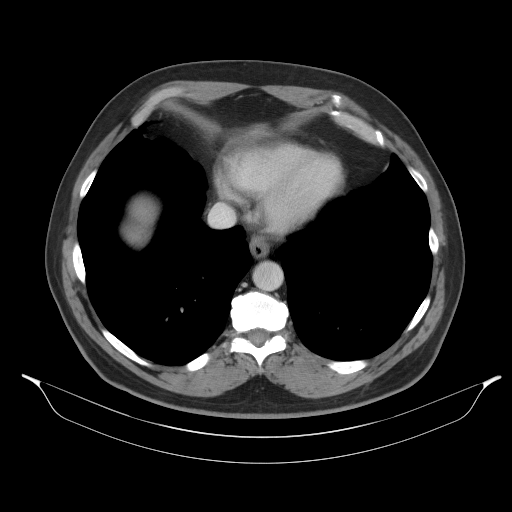
[im 86/91  soft-tissue]
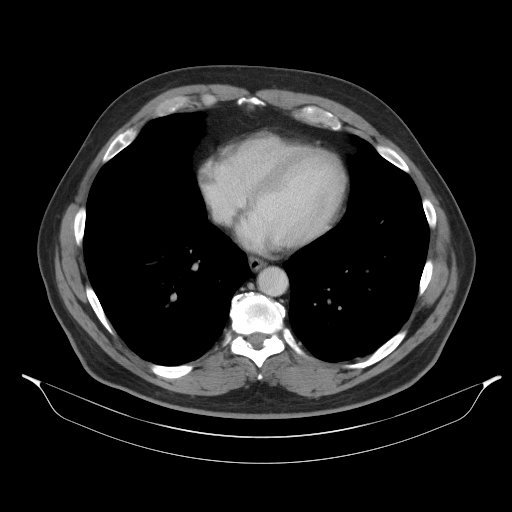

[Series 3: abd pelvis 3.0 spo cor · coronal · 0.87mm/px · 3 of 94 slices shown]
[im 32/94  soft-tissue]
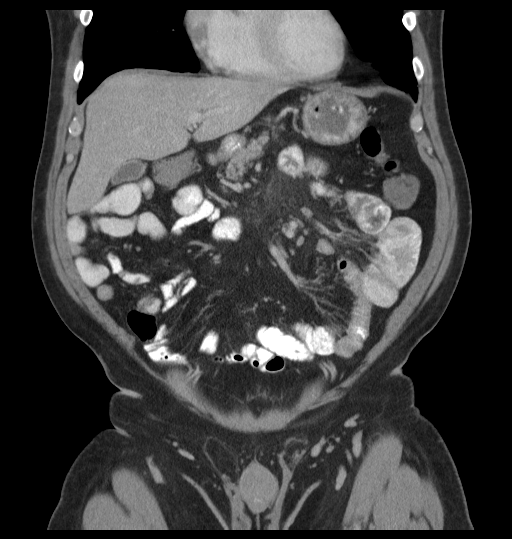
[im 42/94  soft-tissue]
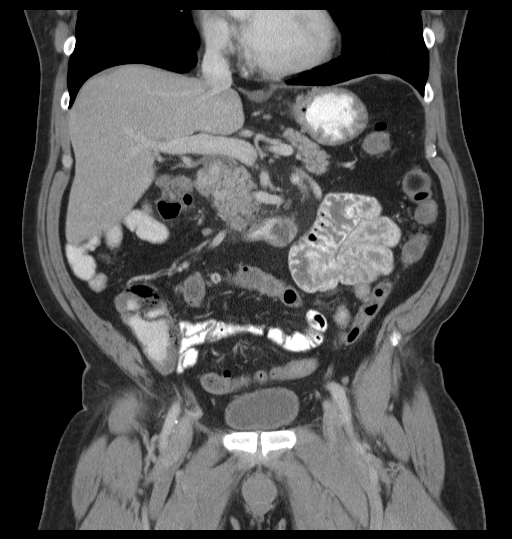
[im 52/94  soft-tissue]
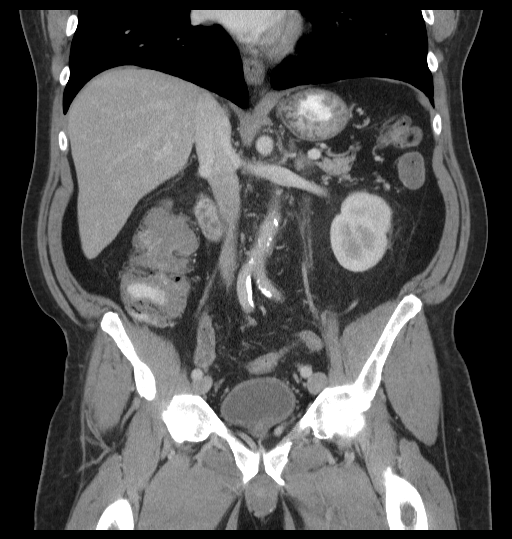

[16 of 46 positions shown; findings below may reference images not displayed]

FINDINGS: Lung bases are unremarkable. Small hiatal hernia. Sagittal images of
the spine shows disc space flattening with vacuum disc phenomenon at
L5-S1 level.

Enhanced liver is unremarkable. No calcified gallstones are noted
within gallbladder. No gastric outlet obstruction. Enhanced
pancreas, spleen adrenal glands are unremarkable. Enhanced kidneys
are symmetrical in size. No hydronephrosis or hydroureter.

Delayed renal images shows bilateral renal symmetrical excretion.
Bilateral visualized proximal ureter is unremarkable.
Atherosclerotic calcifications of abdominal aorta and iliac arteries
are noted.

No small bowel obstruction.  No ascites or free air.  No adenopathy.

There is no pericecal inflammation. The terminal ileum is
unremarkable. Normal appendix partially visualized in axial image
52. Few diverticula are noted descending colon. There is no evidence
of acute diverticulitis. Colonic diverticula are noted sigmoid
colon. There is a redundant sigmoid colon. There is no evidence of
acute diverticulitis. Mild enlarged prostate gland with indentation
of urinary bladder base. Prostate gland measures 4.8 by 4.7 cm. Mild
thickening of urinary bladder wall. Chronic inflammation or cystitis
cannot be excluded. Clinical correlation is necessary. There is no
inguinal adenopathy. Small right inguinal canal hernia containing
fat without evidence of acute complication. No destructive bony
lesions are noted within pelvis. Degenerative changes bilateral SI
joints.
IMPRESSION: 1. There is no evidence of acute inflammatory process within
abdomen.
2. No hydronephrosis or hydroureter.
3. Atherosclerotic calcifications of abdominal aorta and iliac
arteries.
4. No small bowel obstruction.
5. No pericecal inflammation.  Normal appendix.
6. Colonic diverticula are noted sigmoid colon. No evidence of acute
diverticulitis. There is redundant sigmoid colon.
7. Mild enlarged prostate gland with indentation of urinary bladder
base.
8. Mild thickening of urinary bladder wall. Chronic inflammation or
cystitis cannot be excluded. Clinical correlation is necessary.

## 2017-06-18 ENCOUNTER — Encounter (INDEPENDENT_AMBULATORY_CARE_PROVIDER_SITE_OTHER): Payer: Medicare Other | Admitting: Ophthalmology

## 2017-06-18 DIAGNOSIS — H2513 Age-related nuclear cataract, bilateral: Secondary | ICD-10-CM

## 2017-06-18 DIAGNOSIS — I10 Essential (primary) hypertension: Secondary | ICD-10-CM | POA: Diagnosis not present

## 2017-06-18 DIAGNOSIS — H35033 Hypertensive retinopathy, bilateral: Secondary | ICD-10-CM | POA: Diagnosis not present

## 2017-06-18 DIAGNOSIS — H353231 Exudative age-related macular degeneration, bilateral, with active choroidal neovascularization: Secondary | ICD-10-CM

## 2017-06-18 DIAGNOSIS — H43813 Vitreous degeneration, bilateral: Secondary | ICD-10-CM

## 2017-07-14 ENCOUNTER — Telehealth: Payer: Self-pay | Admitting: Internal Medicine

## 2017-07-14 DIAGNOSIS — J449 Chronic obstructive pulmonary disease, unspecified: Secondary | ICD-10-CM

## 2017-07-14 NOTE — Telephone Encounter (Signed)
lmtcb

## 2017-07-15 ENCOUNTER — Other Ambulatory Visit: Payer: Self-pay | Admitting: Adult Health

## 2017-07-15 DIAGNOSIS — J449 Chronic obstructive pulmonary disease, unspecified: Secondary | ICD-10-CM

## 2017-07-15 MED ORDER — ALBUTEROL SULFATE HFA 108 (90 BASE) MCG/ACT IN AERS: 2.0000 | INHALATION_SPRAY | Freq: Four times a day (QID) | RESPIRATORY_TRACT | 0 refills | Status: DC | PRN

## 2017-07-15 MED ORDER — BECLOMETHASONE DIPROPIONATE 80 MCG/ACT IN AERS: 1.0000 | INHALATION_SPRAY | RESPIRATORY_TRACT | 0 refills | Status: DC | PRN

## 2017-07-15 NOTE — Telephone Encounter (Signed)
Patient returning phone [phone].

## 2017-07-15 NOTE — Telephone Encounter (Signed)
Spoke with the pt  I advised will refill qvar and proair per his req, but needs to keep planned ov for 09/30/17  He verbalized understanding  Nothing further needed

## 2017-07-15 NOTE — Telephone Encounter (Signed)
LMTCB

## 2017-07-15 NOTE — Telephone Encounter (Signed)
Patient returning call - he can be reached at [phone] -pr

## 2017-07-16 ENCOUNTER — Telehealth: Payer: Self-pay | Admitting: Pulmonary Disease

## 2017-07-16 MED ORDER — BECLOMETHASONE DIPROP HFA 80 MCG/ACT IN AERB: 2.0000 | INHALATION_SPRAY | Freq: Two times a day (BID) | RESPIRATORY_TRACT | 2 refills | Status: DC

## 2017-07-16 NOTE — Telephone Encounter (Signed)
LMTC x 1  

## 2017-07-16 NOTE — Telephone Encounter (Signed)
Spoke with pt. He is needing a refill on Qvar Redihaler. This has been sent in. Pt has an upcoming appointment with VS in October. Nothing further was needed.

## 2017-07-16 NOTE — Telephone Encounter (Signed)
Pt returning call and can be reached @ [phone].Caren GriffinsStanley A Dalton

## 2017-07-17 ENCOUNTER — Telehealth: Payer: Self-pay | Admitting: Pulmonary Disease

## 2017-07-17 NOTE — Telephone Encounter (Signed)
PA request was received yesterday, [email or web address] [id number] This has been sent to plan, and a response is expected within 72 hours.  Spoke with [place] at pharmacy, made aware of status.  Will continue to follow up on PA.

## 2017-07-20 NOTE — Telephone Encounter (Signed)
lmtcb x1 for pt. 

## 2017-07-20 NOTE — Telephone Encounter (Signed)
Patient called regarding prior Berkley Harveyauth - pt states that he was contacted by [facility] and was notified that PA was denied because insurance carrier was unable to get additional info from our office - pt can be reached at [phone] -pr

## 2017-07-21 MED ORDER — FLUTICASONE PROPIONATE HFA 44 MCG/ACT IN AERO: 2.0000 | INHALATION_SPRAY | Freq: Two times a day (BID) | RESPIRATORY_TRACT | 11 refills | Status: DC

## 2017-07-21 NOTE — Telephone Encounter (Signed)
Checked PA status on [email or web address], states that Qvar Redihaler was denied d/t not meeting insurance criteria.  Called [facility] at [phone] for further details- per rep, states that arnuity, flovent diskus, or flovent HFA are preferred alternatives.    Spoke with pt, made him aware of above recs.    VS please advise on above listed covered alternatives for pt.  Thanks!

## 2017-07-21 NOTE — Telephone Encounter (Signed)
Please change to flovent 44 mcg two puffs bid. 

## 2017-07-21 NOTE — Telephone Encounter (Signed)
Spoke with pt, aware of rx change.  rx sent to preferred pharmacy.  Nothing further needed.  

## 2017-08-13 ENCOUNTER — Encounter (INDEPENDENT_AMBULATORY_CARE_PROVIDER_SITE_OTHER): Payer: Medicare Other | Admitting: Ophthalmology

## 2017-08-13 DIAGNOSIS — H35033 Hypertensive retinopathy, bilateral: Secondary | ICD-10-CM

## 2017-08-13 DIAGNOSIS — I10 Essential (primary) hypertension: Secondary | ICD-10-CM | POA: Diagnosis not present

## 2017-08-13 DIAGNOSIS — H43813 Vitreous degeneration, bilateral: Secondary | ICD-10-CM

## 2017-08-13 DIAGNOSIS — H2513 Age-related nuclear cataract, bilateral: Secondary | ICD-10-CM

## 2017-08-13 DIAGNOSIS — H353231 Exudative age-related macular degeneration, bilateral, with active choroidal neovascularization: Secondary | ICD-10-CM

## 2017-09-05 ENCOUNTER — Encounter (HOSPITAL_COMMUNITY): Payer: Self-pay | Admitting: Emergency Medicine

## 2017-09-05 ENCOUNTER — Emergency Department (HOSPITAL_COMMUNITY): Payer: Medicare Other

## 2017-09-05 ENCOUNTER — Emergency Department (HOSPITAL_COMMUNITY)
Admission: EM | Admit: 2017-09-05 | Discharge: 2017-09-05 | Disposition: A | Discharge status: Home / Self Care | Payer: Medicare Other | Attending: Emergency Medicine | Admitting: Emergency Medicine

## 2017-09-05 DIAGNOSIS — Z79899 Other long term (current) drug therapy: Secondary | ICD-10-CM | POA: Diagnosis not present

## 2017-09-05 DIAGNOSIS — E7800 Pure hypercholesterolemia, unspecified: Secondary | ICD-10-CM | POA: Insufficient documentation

## 2017-09-05 DIAGNOSIS — I10 Essential (primary) hypertension: Secondary | ICD-10-CM | POA: Diagnosis not present

## 2017-09-05 DIAGNOSIS — R109 Unspecified abdominal pain: Secondary | ICD-10-CM | POA: Diagnosis not present

## 2017-09-05 DIAGNOSIS — R339 Retention of urine, unspecified: Secondary | ICD-10-CM | POA: Insufficient documentation

## 2017-09-05 DIAGNOSIS — F17210 Nicotine dependence, cigarettes, uncomplicated: Secondary | ICD-10-CM | POA: Diagnosis not present

## 2017-09-05 DIAGNOSIS — R338 Other retention of urine: Secondary | ICD-10-CM

## 2017-09-05 DIAGNOSIS — J449 Chronic obstructive pulmonary disease, unspecified: Secondary | ICD-10-CM | POA: Insufficient documentation

## 2017-09-05 LAB — URINALYSIS, ROUTINE W REFLEX MICROSCOPIC
BILIRUBIN URINE: NEGATIVE
Glucose, UA: NEGATIVE mg/dL
Hgb urine dipstick: NEGATIVE
KETONES UR: 5 mg/dL — AB
Leukocytes, UA: NEGATIVE
Nitrite: NEGATIVE
PH: 5 (ref 5.0–8.0)
Protein, ur: NEGATIVE mg/dL
SPECIFIC GRAVITY, URINE: 1.016 (ref 1.005–1.030)

## 2017-09-05 NOTE — ED Notes (Signed)
Pt's catheter switched to leg bag. Provided care instructions for the leg bag and things to look for at home that would be concerning with the foley. Pt verbalized understanding and will follow up with urology ASAP.

## 2017-09-05 NOTE — ED Notes (Signed)
Patient transported to CT 

## 2017-09-05 NOTE — ED Triage Notes (Signed)
Pt states he is not able to urinate since last night at 10 pm. Pt states he has a hx of this before and had to have a urinary catheter.

## 2017-09-05 NOTE — ED Provider Notes (Signed)
[facility]-EMERGENCY DEPT Provider Note   CSN: [id number] Arrival date & time: 09/05/17  0506     History   Chief Complaint Chief Complaint  Patient presents with  . Urinary Retention    HPI Jeffey Janssen is a 66 y.o. male.  HPI Patient says 66 year old male with a history of prostatitis currently on antibiotics he reported difficulty urinating earlier this morning.  He tried multiple ways to urinate without improvement.  He has had a catheter before.  He tried a hot tub as well as his Flomax without improvement in his symptoms.  Denies fevers and chills.  Denies low back pain.  Patient was just over 400 cc urine output after Foley catheter placed on arrival to the emergency department.  Patient feels better at this time.   Past Medical History:  Diagnosis Date  . 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 High cholesterol   . Hypertension   . Wet senile macular degeneration [facility])         Patient Active Problem List   Diagnosis Date Noted  . Snoring 07/26/2013  . Tobacco abuse 01/04/2013  . 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 left ring finger surgery    . TONSILLECTOMY         Home Medications    Prior to Admission medications   Medication Sig Start Date End Date Taking? Authorizing Provider  albuterol (PROAIR HFA) 108 (90 Base) MCG/ACT inhaler Inhale 2 puffs into the lungs every 6 (six) hours as needed for wheezing. 07/15/17 10/14/20  Coralyn Helling, MD  amLODipine (NORVASC) 5 MG tablet Take 5 mg by mouth daily.    [provider]  EPINEPHrine 0.3 mg/0.3 mL IJ SOAJ injection Inject 0.3 mLs (0.3 mg total) into the muscle once. 08/05/15   Linna Hoff, MD  fluticasone (FLOVENT HFA) 44 MCG/ACT inhaler Inhale 2 puffs into the lungs 2 (two) times daily. 07/21/17   Coralyn Helling, MD  rosuvastatin (CRESTOR) 20 MG tablet Take 20 mg by mouth daily.     [provider]  tamsulosin (FLOMAX) 0.4 MG CAPS capsule Take 1  capsule by mouth daily. 08/14/15   [provider]  triamcinolone (NASACORT AQ) 55 MCG/ACT AERO nasal inhaler Place 2 sprays into the nose daily. 11/20/14   Dohmeier, Porfirio Mylar, MD    Family History Family History  Problem Relation Age of Onset  . Lung cancer Mother   . COPD Mother   . Thyroid disease Sister     Social History Social History  Substance Use Topics  . Smoking status: Current Every Day Smoker    Packs/day: 1.00    Years: 46.00    Types: Cigarettes  . Smokeless tobacco: Never Used  . Alcohol use 0.0 oz/week     Comment: 1 pint weekly-2 weeks     Allergies   Bee venom; Ciprofloxacin; and Ivp dye [iodinated diagnostic agents]   Review of Systems Review of Systems  All other systems reviewed and are negative.    Physical Exam Updated Vital Signs BP (!) 143/91   Pulse 79   Temp 98.2 F (36.8 C) (Oral)   Resp 18   Ht 5' 11.5" (1.816 m)   Wt 105.7 kg (233 lb)   SpO2 100%   BMI 32.04 kg/m   Physical Exam  Constitutional: He is oriented to person, place, and time. He appears well-developed and well-nourished.  HENT:  Head: Normocephalic.  Eyes: EOM are normal.  Neck: Normal range of motion.  Pulmonary/Chest: Effort normal.  Abdominal: He exhibits no distension.  Genitourinary:  Genitourinary Comments: Normal testicles.  Normal penis.  Foley catheter in place.  Musculoskeletal: Normal range of motion.  Neurological: He is alert and oriented to person, place, and time.  Psychiatric: He has a normal mood and affect.  Nursing note and vitals reviewed.    ED Treatments / Results  Labs (all labs ordered are listed, but only abnormal results are displayed) Labs Reviewed  URINALYSIS, ROUTINE W REFLEX MICROSCOPIC    EKG  EKG Interpretation None       Radiology No results found.  Procedures Procedures (including critical care time)  Medications Ordered in ED Medications - No data to display   Initial Impression / Assessment and  Plan / ED Course  I have reviewed the triage vital signs and the nursing notes.  Pertinent labs & imaging results that were available during my care of the patient were reviewed by me and considered in my medical decision making (see chart for details).     Acute urinary retention.  Plan will be for discharge home to follow-up with urology.  Patient is still having some lower abdominal pressure at this time.  Urinalysis and urine culture sent.  CT stone study pending at this time.  Final Clinical Impressions(s) / ED Diagnoses   Final diagnoses:  None    New Prescriptions New Prescriptions   No medications on file     Azalia Bilis, MD 09/05/17 [phone]

## 2017-09-30 ENCOUNTER — Ambulatory Visit (INDEPENDENT_AMBULATORY_CARE_PROVIDER_SITE_OTHER): Payer: Medicare Other | Admitting: Pulmonary Disease

## 2017-09-30 ENCOUNTER — Encounter: Payer: Self-pay | Admitting: Pulmonary Disease

## 2017-09-30 VITALS — BP 118/72 | HR 65 | Ht 71.0 in | Wt 229.0 lb

## 2017-09-30 DIAGNOSIS — J449 Chronic obstructive pulmonary disease, unspecified: Secondary | ICD-10-CM

## 2017-09-30 DIAGNOSIS — Z720 Tobacco use: Secondary | ICD-10-CM

## 2017-09-30 NOTE — Progress Notes (Signed)
Current Outpatient Prescriptions on File Prior to Visit  Medication Sig  . albuterol (PROAIR HFA) 108 (90 Base) MCG/ACT inhaler Inhale 2 puffs into the lungs every 6 (six) hours as needed for wheezing.  Marland Kitchen amLODipine (NORVASC) 10 MG tablet Take 10 mg by mouth daily.   Marland Kitchen EPINEPHrine 0.3 mg/0.3 mL IJ SOAJ injection Inject 0.3 mLs (0.3 mg total) into the muscle once.  . fluticasone (FLOVENT HFA) 44 MCG/ACT inhaler Inhale 2 puffs into the lungs 2 (two) times daily.  Marland Kitchen LINZESS 145 MCG CAPS capsule Take 145 mg by mouth daily.  . rosuvastatin (CRESTOR) 20 MG tablet Take 20 mg by mouth daily.   Marland Kitchen sulfamethoxazole-trimethoprim (BACTRIM DS,SEPTRA DS) 800-160 MG tablet Take 1 tablet by mouth 2 (two) times daily.  . tamsulosin (FLOMAX) 0.4 MG CAPS capsule Take 0.4 mg by mouth 2 (two) times daily.   Marland Kitchen triamcinolone (NASACORT AQ) 55 MCG/ACT AERO nasal inhaler Place 2 sprays into the nose daily.   No current facility-administered medications on file prior to visit.      Chief Complaint  Patient presents with  . Follow-up    Pt does not like flovent inhaler at all, use to be on Qvar; insurance didn't cover it. He would like to know if there is another option. Pt noticed in last few months smoking more often than usual do to additional stress.     Pulmonary tests PFT 05/12/12>>FEV1 3.37(99%), FEV1% 68, TLC 7.33(103%), DLCO 116%, +BD Spirometry 08/20/15 >> FEV1 3.30 (105%), FEV1% 76 Low dose CT chest 09/02/16 >> paraseptal emphysema, bronchial wall thickening  Past medical history HTN, HLD, Prostatitis  Past surgical hx, Allergies, Family hx, Social hx all reviewed.  Vital Signs BP 118/72 (BP Location: Left Arm, Cuff Size: Normal)   Pulse 65   Ht  (1.803 m)   Wt 229 lb (103.9 kg)   SpO2 98%   BMI 31.94 kg/m   History of Present Illness Tim Thomas is a 66 y.o. male smoker with COPD/asthma.  He started smoking again.  He had to start using flovent again after this.  He was getting  cough, and chest congestion.  He has cut down his smoking some and not needing flovent as much.  He was using proair on routine basis before using flovent, but didn't feel like he needed it.  He had CT chest screening last year >> Lung RADS1.  He has has been under stress from issues related to his prostate.  He had flu shot last year.   Physical Exam  General - pleasant Eyes - pupils reactive ENT - no sinus tenderness, no oral exudate, no LAN Cardiac - regular, no murmur Chest - no wheeze, rales Abd - soft, non tender Ext - no edema Skin - no rashes Neuro - normal strength Psych - normal mood   Assessment/Plan  COPD asthma. - had extensive discussion about different roles of inhalers - advised that he could try stopping flovent - he can use prn proair >> reviewed when he should be using this  Tobacco abuse. - had extensive discussion about options to assist with smoking cessation - he will try quitting on his own again  Lung cancer screening. - he will have follow up for this with his PCP    Patient Instructions  Follow up in 1 year  Time spent 26 minutes  Coralyn Helling, MD [facility] Pager:  [phone] 09/30/2017, 10:28 AM

## 2017-09-30 NOTE — Patient Instructions (Signed)
Follow up in 1 year.

## 2017-10-22 ENCOUNTER — Encounter (INDEPENDENT_AMBULATORY_CARE_PROVIDER_SITE_OTHER): Payer: Medicare Other | Admitting: Ophthalmology

## 2017-10-22 DIAGNOSIS — H35033 Hypertensive retinopathy, bilateral: Secondary | ICD-10-CM | POA: Diagnosis not present

## 2017-10-22 DIAGNOSIS — I10 Essential (primary) hypertension: Secondary | ICD-10-CM

## 2017-10-22 DIAGNOSIS — H43813 Vitreous degeneration, bilateral: Secondary | ICD-10-CM | POA: Diagnosis not present

## 2017-10-22 DIAGNOSIS — H353231 Exudative age-related macular degeneration, bilateral, with active choroidal neovascularization: Secondary | ICD-10-CM | POA: Diagnosis not present

## 2017-12-31 ENCOUNTER — Emergency Department (HOSPITAL_COMMUNITY)
Admission: EM | Admit: 2017-12-31 | Discharge: 2018-01-01 | Disposition: A | Discharge status: Home / Self Care | Payer: Medicare Other | Attending: Emergency Medicine | Admitting: Emergency Medicine

## 2017-12-31 ENCOUNTER — Encounter (HOSPITAL_COMMUNITY): Payer: Self-pay | Admitting: Emergency Medicine

## 2017-12-31 DIAGNOSIS — J45909 Unspecified asthma, uncomplicated: Secondary | ICD-10-CM | POA: Insufficient documentation

## 2017-12-31 DIAGNOSIS — J449 Chronic obstructive pulmonary disease, unspecified: Secondary | ICD-10-CM | POA: Insufficient documentation

## 2017-12-31 DIAGNOSIS — I10 Essential (primary) hypertension: Secondary | ICD-10-CM | POA: Diagnosis not present

## 2017-12-31 DIAGNOSIS — F17200 Nicotine dependence, unspecified, uncomplicated: Secondary | ICD-10-CM | POA: Diagnosis not present

## 2017-12-31 DIAGNOSIS — R339 Retention of urine, unspecified: Secondary | ICD-10-CM | POA: Insufficient documentation

## 2017-12-31 DIAGNOSIS — Z79899 Other long term (current) drug therapy: Secondary | ICD-10-CM | POA: Diagnosis not present

## 2017-12-31 HISTORY — DX: Benign prostatic hyperplasia without lower urinary tract symptoms: N40.0

## 2017-12-31 LAB — URINALYSIS, ROUTINE W REFLEX MICROSCOPIC
Bilirubin Urine: NEGATIVE
GLUCOSE, UA: NEGATIVE mg/dL
Ketones, ur: NEGATIVE mg/dL
LEUKOCYTES UA: NEGATIVE
NITRITE: NEGATIVE
PH: 5 (ref 5.0–8.0)
Protein, ur: NEGATIVE mg/dL
SPECIFIC GRAVITY, URINE: 1.008 (ref 1.005–1.030)
Squamous Epithelial / LPF: NONE SEEN

## 2017-12-31 NOTE — ED Triage Notes (Signed)
Patient reports urinary retention / bladder pressure , last voided 2 pm today , history of prostate enlargement , denies fever or chills .

## 2017-12-31 NOTE — ED Provider Notes (Signed)
[facility] EMERGENCY DEPARTMENT Provider Note   CSN: [id number] Arrival date & time: 12/31/17  2134     History   Chief Complaint Chief Complaint  Patient presents with  . Urinary Retention    HPI Tim Thomas is a 67 y.o. male.  68 y/o male with hx of HTN, HLD, COPD, BPH presents to the ED for decreased UO. Last void 1400 today. He states that he began noticing decreased urinary stream yesterday. This became progressive this morning up until the patient noticed inability to void at all. He tried drinking excess water to help his symptoms. He attributes his retention to use of a steroid course and muscle relaxer, prescribed by his PCP for muscle spasm in the neck. No preceding dysuria, hematuria, fevers. He is c/o lower abdominal pressure.       Past Medical History:  Diagnosis Date  . COPD (chronic obstructive pulmonary disease) (HCC)   . 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 High cholesterol   . Hypertension   . Prostate enlargement   . 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 Snoring 07/26/2013  . Tobacco abuse 01/04/2013  . 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 left ring finger surgery    . TONSILLECTOMY         Home Medications    Prior to Admission medications   Medication Sig Start Date End Date Taking? Authorizing Provider  amLODipine (NORVASC) 10 MG tablet Take 10 mg by mouth daily.    Yes [provider]  cyclobenzaprine (FLEXERIL) 10 MG tablet Take 10 mg by mouth 3 (three) times daily. 12/28/17  Yes [provider]  doxycycline (VIBRAMYCIN) 100 MG capsule Take 100 mg by mouth as needed (for scalp).   Yes [provider]  EPINEPHrine 0.3 mg/0.3 mL IJ SOAJ injection Inject 0.3 mLs (0.3 mg total) into the muscle once. Patient taking differently: Inject 0.3 mg into the muscle as needed (for  allergies).  08/05/15  Yes KindlQuita Skye, MD  LINZESS 782 MCG CAPS capsule Take 145 mg by mouth daily as needed (for constipation).  08/27/17  Yes [provider]  predniSONE (DELTASONE) 10 MG tablet Take 10 mg by mouth See admin instructions. Take 1 tablet three times a day for 3 days then take 1 tablet twice daily for 3 days then take 1 tablet daily for 3 days 12/28/17  Yes [provider]  rosuvastatin (CRESTOR) 20 MG tablet Take 20 mg by mouth daily.    Yes [provider]  tamsulosin (FLOMAX) 0.4 MG CAPS capsule Take 0.4 mg by mouth 2 (two) times daily.  08/14/15  Yes [provider]  albuterol (PROAIR HFA) 108 (90 Base) MCG/ACT inhaler Inhale 2 puffs into the lungs every 6 (six) hours as needed for wheezing. Patient not taking: Reported on 12/31/2017 07/15/17 10/14/20  Coralyn Helling, MD  fluticasone (FLOVENT HFA) 44 MCG/ACT inhaler Inhale 2 puffs into the lungs 2 (two) times daily. Patient not taking: Reported on 12/31/2017 07/21/17   Coralyn Helling, MD  triamcinolone (NASACORT AQ) 55 MCG/ACT AERO nasal inhaler Place 2 sprays into the nose daily. Patient not taking: Reported on 12/31/2017 11/20/14   Dohmeier, Porfirio Mylar, MD    Family History Family History  Problem Relation Age of Onset  . Lung cancer Mother   . COPD Mother   .  Thyroid disease Sister     Social History Social History   Tobacco Use  . Smoking status: Current Every Day Smoker    Packs/day: 1.50    Years: 46.00    Pack years: 69.00  . Smokeless tobacco: Never Used  Substance Use Topics  . Alcohol use: Yes    Alcohol/week: 0.0 oz  . Drug use: No     Allergies   Bee venom; Ciprofloxacin; and Ivp dye [iodinated diagnostic agents]   Review of Systems Review of Systems Ten systems reviewed and are negative for acute change, except as noted in the HPI.    Physical Exam Updated Vital Signs BP (!) 144/86   Pulse 86   Temp 98.1 F (36.7 C) (Oral)   Resp 15   Ht 5\' 11"  (1.803 m)   Wt  101.6 kg (224 lb)   SpO2 100%   BMI 31.24 kg/m   Physical Exam  Constitutional: He is oriented to person, place, and time. He appears well-developed and well-nourished. No distress.  Nontoxic appearing; visibly uncomfortable.  HENT:  Head: Normocephalic and atraumatic.  Eyes: Conjunctivae and EOM are normal. No scleral icterus.  Neck: Normal range of motion.  Pulmonary/Chest: Effort normal. No respiratory distress.  Respirations even and unlabored  Abdominal: Soft. There is tenderness.  Lower abdominal TTP. Otherwise abdomen soft, obese.  Musculoskeletal: Normal range of motion.  Neurological: He is alert and oriented to person, place, and time. He exhibits normal muscle tone. Coordination normal.  Skin: Skin is warm and dry. No rash noted. He is not diaphoretic. No erythema. No pallor.  Psychiatric: He has a normal mood and affect. His behavior is normal.  Nursing note and vitals reviewed.    ED Treatments / Results  Labs (all labs ordered are listed, but only abnormal results are displayed) Labs Reviewed  URINALYSIS, ROUTINE W REFLEX MICROSCOPIC - Abnormal; Notable for the following components:      Result Value   Color, Urine STRAW (*)    Hgb urine dipstick SMALL (*)    Bacteria, UA RARE (*)    All other components within normal limits    EKG  EKG Interpretation None       Radiology No results found.  Procedures Procedures (including critical care time)  Medications Ordered in ED Medications - No data to display   Initial Impression / Assessment and Plan / ED Course  I have reviewed the triage vital signs and the nursing notes.  Pertinent labs & imaging results that were available during my care of the patient were reviewed by me and considered in my medical decision making (see chart for details).     11:12 PM Patient presenting for increased lower abdominal pressure.  Last void at 1400.  History of BPH.  Bladder scan shows greater than 1 L in bladder.   Foley catheter successfully placed.  Pending urinalysis.  12:17 AM Urinalysis reassuring and without evidence of UTI.  Patient on daily Flomax.  I have advised that he continue this medication.  He is to follow-up with his urologist.  Patient verbalizes understanding.  Discharged in stable condition.   Final Clinical Impressions(s) / ED Diagnoses   Final diagnoses:  Urinary retention    ED Discharge Orders    None       Antony MaduraHumes, Aster Eckrich, PA-C 01/01/18 0017    Azalia Bilisampos, Kevin, MD 01/01/18 [phone]

## 2017-12-31 NOTE — Discharge Instructions (Signed)
Continue daily Flomax and follow-up with your urologist regarding your visit today.  You may return for new or concerning symptoms.

## 2017-12-31 NOTE — ED Notes (Signed)
Pt feels immediate relief from catheter placement

## 2018-01-01 NOTE — ED Notes (Signed)
Pt states understanding d/c instructions and follow up

## 2018-01-14 ENCOUNTER — Encounter (INDEPENDENT_AMBULATORY_CARE_PROVIDER_SITE_OTHER): Payer: Medicare Other | Admitting: Ophthalmology

## 2018-02-03 ENCOUNTER — Other Ambulatory Visit: Payer: Self-pay

## 2018-02-03 ENCOUNTER — Encounter (HOSPITAL_COMMUNITY): Payer: Self-pay | Admitting: Emergency Medicine

## 2018-02-03 ENCOUNTER — Emergency Department (HOSPITAL_COMMUNITY)
Admission: EM | Admit: 2018-02-03 | Discharge: 2018-02-03 | Disposition: A | Discharge status: Home / Self Care | Payer: Medicare Other | Attending: Emergency Medicine | Admitting: Emergency Medicine

## 2018-02-03 DIAGNOSIS — Z79899 Other long term (current) drug therapy: Secondary | ICD-10-CM | POA: Insufficient documentation

## 2018-02-03 DIAGNOSIS — F17210 Nicotine dependence, cigarettes, uncomplicated: Secondary | ICD-10-CM | POA: Diagnosis not present

## 2018-02-03 DIAGNOSIS — R339 Retention of urine, unspecified: Secondary | ICD-10-CM | POA: Diagnosis present

## 2018-02-03 DIAGNOSIS — J449 Chronic obstructive pulmonary disease, unspecified: Secondary | ICD-10-CM | POA: Diagnosis not present

## 2018-02-03 DIAGNOSIS — I10 Essential (primary) hypertension: Secondary | ICD-10-CM | POA: Insufficient documentation

## 2018-02-03 LAB — URINALYSIS, ROUTINE W REFLEX MICROSCOPIC
BILIRUBIN URINE: NEGATIVE
GLUCOSE, UA: NEGATIVE mg/dL
KETONES UR: NEGATIVE mg/dL
Leukocytes, UA: NEGATIVE
Nitrite: NEGATIVE
PH: 5 (ref 5.0–8.0)
Protein, ur: NEGATIVE mg/dL
Specific Gravity, Urine: 1.02 (ref 1.005–1.030)
Squamous Epithelial / LPF: NONE SEEN

## 2018-02-03 NOTE — ED Notes (Signed)
PT states understanding of care given, follow up care. PT ambulated from ED to car with a steady gait.  

## 2018-02-03 NOTE — Discharge Instructions (Signed)
Follow-up with urology in the next few days.

## 2018-02-03 NOTE — ED Provider Notes (Signed)
[facility] Provider Note   CSN: [id number] Arrival date & time: 02/03/18  0411     History   Chief Complaint Chief Complaint  Patient presents with  . Urinary Retention    HPI Tim Thomas is a 67 y.o. male.  Patient is a 67 year old male with history of BPH presenting with urinary retention.  He has had similar episodes in the past.  This evening he developed the inability to void.  He denies any fevers or chills.  He denies any abdominal pain.  He is scheduled to see his urologist this coming Friday.   The history is provided by the patient.    Past Medical History:  Diagnosis Date  . COPD (chronic obstructive pulmonary disease) (HCC)   . 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 High cholesterol   . Hypertension   . Prostate enlargement   . Wet senile macular degeneration [facility](HCC)         Patient Active Problem List   Diagnosis Date Noted  . Snoring 07/26/2013  . Tobacco abuse 01/04/2013  . 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 left ring finger surgery    . TONSILLECTOMY         Home Medications    Prior to Admission medications   Medication Sig Start Date End Date Taking? Authorizing Provider  amLODipine (NORVASC) 10 MG tablet Take 10 mg by mouth daily.    Yes [provider]  doxycycline (VIBRAMYCIN) 100 MG capsule Take 100 mg by mouth as needed (for scalp).   Yes [provider]  EPINEPHrine 0.3 mg/0.3 mL IJ SOAJ injection Inject 0.3 mLs (0.3 mg total) into the muscle once. Patient taking differently: Inject 0.3 mg into the muscle as needed (for allergies).  08/05/15  Yes KindlQuita Skye, James D, MD  LINZESS 409145 MCG CAPS capsule Take 145 mg by mouth daily as needed (for constipation).  08/27/17  Yes [provider]  rosuvastatin (CRESTOR) 20 MG tablet Take 20 mg by mouth daily.    Yes [provider]  tamsulosin (FLOMAX) 0.4 MG CAPS capsule  Take 0.4 mg by mouth 2 (two) times daily.  08/14/15  Yes [provider]  albuterol (PROAIR HFA) 108 (90 Base) MCG/ACT inhaler Inhale 2 puffs into the lungs every 6 (six) hours as needed for wheezing. Patient not taking: Reported on 12/31/2017 07/15/17 10/14/20  Coralyn HellingSood, Vineet, MD  fluticasone (FLOVENT HFA) 44 MCG/ACT inhaler Inhale 2 puffs into the lungs 2 (two) times daily. Patient not taking: Reported on 12/31/2017 07/21/17   Coralyn HellingSood, Vineet, MD  predniSONE (DELTASONE) 10 MG tablet Take 10 mg by mouth See admin instructions. Take 1 tablet three times a day for 3 days then take 1 tablet twice daily for 3 days then take 1 tablet daily for 3 days 12/28/17   [provider]  triamcinolone (NASACORT AQ) 55 MCG/ACT AERO nasal inhaler Place 2 sprays into the nose daily. Patient not taking: Reported on 12/31/2017 11/20/14   Dohmeier, Porfirio Mylararmen, MD    Family History Family History  Problem Relation Age of Onset  . Lung cancer Mother   . COPD Mother   . Thyroid disease Sister     Social History Social History   Tobacco Use  . Smoking status: Current Every Day Smoker    Packs/day: 1.50    Years: 46.00    Pack years: 69.00  . Smokeless tobacco: Never Used  Substance Use  Topics  . Alcohol use: Yes    Alcohol/week: 0.0 oz  . Drug use: No     Allergies   Bee venom; Ciprofloxacin; and Ivp dye [iodinated diagnostic agents]   Review of Systems Review of Systems  All other systems reviewed and are negative.    Physical Exam Updated Vital Signs BP (!) 147/93 (BP Location: Right Arm)   Pulse 70   Temp 99 F (37.2 C) (Oral)   Resp 19   Ht 5' 11.5" (1.816 m)   Wt 101.6 kg (224 lb)   SpO2 100%   BMI 30.81 kg/m   Physical Exam  Constitutional: He is oriented to person, place, and time. He appears well-developed and well-nourished. No distress.  HENT:  Head: Normocephalic and atraumatic.  Mouth/Throat: Oropharynx is clear and moist.  Neck: Normal range of motion. Neck supple.   Cardiovascular: Normal rate and regular rhythm. Exam reveals no friction rub.  No murmur heard. Pulmonary/Chest: Effort normal and breath sounds normal. No respiratory distress. He has no wheezes. He has no rales.  Abdominal: Soft. Bowel sounds are normal. He exhibits no distension. There is no tenderness.  Musculoskeletal: Normal range of motion. He exhibits no edema.  Neurological: He is alert and oriented to person, place, and time. Coordination normal.  Skin: Skin is warm and dry. He is not diaphoretic.  Nursing note and vitals reviewed.    ED Treatments / Results  Labs (all labs ordered are listed, but only abnormal results are displayed) Labs Reviewed  URINALYSIS, ROUTINE W REFLEX MICROSCOPIC - Abnormal; Notable for the following components:      Result Value   Hgb urine dipstick MODERATE (*)    Bacteria, UA RARE (*)    All other components within normal limits    EKG  EKG Interpretation None       Radiology No results found.  Procedures Procedures (including critical care time)  Medications Ordered in ED Medications - No data to display   Initial Impression / Assessment and Plan / ED Course  I have reviewed the triage vital signs and the nursing notes.  Pertinent labs & imaging results that were available during my care of the patient were reviewed by me and considered in my medical decision making (see chart for details).  Foley catheter placed with 500 cc of urine obtained and relief of symptoms.  He will be discharged with a leg bag and follow-up with urology in the next few days.  Final Clinical Impressions(s) / ED Diagnoses   Final diagnoses:  None    ED Discharge Orders    None       Geoffery Lyons, MD 02/03/18 [phone]

## 2018-02-03 NOTE — ED Triage Notes (Signed)
Pt c/o not been able to urinate since yesterday at 6pm, pt bladder scanned on triage and shows more than 500 cc in. Pt c/o 10/10 pain at this time has an appointment with Urology next Monday.

## 2018-03-09 ENCOUNTER — Other Ambulatory Visit: Payer: Self-pay | Admitting: Urology

## 2018-03-10 ENCOUNTER — Other Ambulatory Visit: Payer: Self-pay

## 2018-03-10 ENCOUNTER — Encounter (HOSPITAL_BASED_OUTPATIENT_CLINIC_OR_DEPARTMENT_OTHER): Payer: Self-pay | Admitting: *Deleted

## 2018-03-10 NOTE — Progress Notes (Addendum)
lov note dr Jacinto Halimganji 11-03-16 received and placed on chart. Stress test 10-217 dr Jacinto Halimganji on chart, nuclear stress test 10-17-17 dr Jacinto Halimganji on chart lov pulmonary dr sood 10-10- 18 on chart/epic

## 2018-03-10 NOTE — Progress Notes (Addendum)
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

## 2018-03-15 ENCOUNTER — Other Ambulatory Visit: Payer: Self-pay | Admitting: Urology

## 2018-03-15 ENCOUNTER — Encounter (INDEPENDENT_AMBULATORY_CARE_PROVIDER_SITE_OTHER): Payer: Medicare Other | Admitting: Ophthalmology

## 2018-03-15 DIAGNOSIS — H43813 Vitreous degeneration, bilateral: Secondary | ICD-10-CM | POA: Diagnosis not present

## 2018-03-15 DIAGNOSIS — H35033 Hypertensive retinopathy, bilateral: Secondary | ICD-10-CM | POA: Diagnosis not present

## 2018-03-15 DIAGNOSIS — H353231 Exudative age-related macular degeneration, bilateral, with active choroidal neovascularization: Secondary | ICD-10-CM

## 2018-03-15 DIAGNOSIS — I10 Essential (primary) hypertension: Secondary | ICD-10-CM | POA: Diagnosis not present

## 2018-03-17 NOTE — H&P (Signed)
HPI: Tim GranaHugh Thomas is a 67 year-old male with urinary retention.  His problem was diagnosed 12/31/2017. His current symptoms did not begin after he had a surgical procedure. His urinary retention is being treated with foley catheter and flomax. Patient denies suprapubic tube, intemittent catheterization, hytrin, cardura, uroxatrol, rapaflo, avodart, and proscar.   He does have to strain or bear down to start his urinary stream. He does not have a good size and strength to his urinary stream. He is having problems with emptying his bladder well. His urine has shut off completely.   He is not having problems with urinary control or incontinence.   He has not previously had an indwelling catheter in for more than two weeks at a time.   01/07/18: Patient with past history of chronic prostatitis and past episode of urinary retention in September 2018. He was last seen in December with PVR of 109. He is managed on Tamsulosin BID. He presented to the ED on 1/10 with retention. He states that on 1/9 he noticed decreased stream, which then progressed. He states that symptoms began shortly after beginning steroid course and muscle relaxer. He denied any dysuria, hematuria, or fevers at the time. Catheter was placed and 1 L of urine output was noted. UA at the time was unremarkable.   01/07/18: Today he presents for voiding trial. He states his catheter has been draining well. He denies fevers or gross hematuria. He continues Tamsulosin BID.   02/09/18: His voiding trial was successful at his last visit. He was maintained on tamsulosin b.i.d.  He presented to the emergency room on 02/03/18 with urinary retention. A Foley catheter was placed with 500 cc drained from the bladder. His urinalysis revealed no sign of infection.  This is been going on for about 6 months or more and he said even before he went into urinary retention he was having significant hesitancy and a very weak stream and would have to sit to  urinate. He said that with the catheter in when he has a bowel movement he will occasionally have what he believes is a bladder spasm and will leak urine around the catheter.     ALLERGIES: Ciprofloxacin - Other Reaction, joint pain Contrast Dye    MEDICATIONS: Amlodipine Besylate 10 mg tablet  Avastin  Crestor 20 mg tablet Oral  EpiPen 2-Pak 0.3 MG/0.3ML DEVI Injection  Flovent  Linzess 290 mcg capsule Oral  Tamsulosin Hcl 0.4 mg capsule Oral     GU PSH: Catheterization For Collection Of Specimen, Single Patient, All Places Of Service - 10/07/2017, 12/04/2016 Catheterize For Residual - 10/07/2017, 12/04/2016 Complex cystometrogram, w/ void pressure and urethral pressure profile studies, any technique - 02/17/2018 Complex Uroflow - 02/17/2018 Emg surf Electrd - 02/17/2018 Inject For cystogram - 02/17/2018 Intrabd voidng Press - 02/17/2018    NON-GU PSH: Bmi<30 And >=22 Calc & Docu - 10/15/2017 Doc Meds Verified W/Pt Or Re - 10/15/2017 Other Pt/Ot Current Status - 10/15/2017 Other Pt/Ot D/C Status - 11/19/2017 Other Pt/Ot Goal Status - 11/19/2017, 10/15/2017 Pain Neg No Plan - 10/15/2017 Remove Tonsils - 2016    GU PMH: Urinary Retention - 02/10/2018, (Stable), His urinary retention has persisted and an attempt at teaching him self catheterization was unsuccessful. He said he would not be able to perform this him therefore we discussed the fact that urinary retention can occurred due to obstruction from the prostate or a weakened detrusor contraction. Because of that I have recommended evaluating his bladder with urodynamics to  determine his suitability for transurethral resection of the prostate., - 02/09/2018, - 01/07/2018, - 09/08/2017 Weak Urinary Stream - 12/24/2017, - 11/19/2017, - 10/28/2017 Chronic prostatitis - 12/17/2017, - 10/07/2017, - 06/30/2017 Pelvic/perineal pain - 10/15/2017 Incomplete bladder emptying (Stable) - 09/15/2017, - 06/30/2017 ED due to arterial insufficiency, We  discussed the various etiologies of ED including neurogenic (small nerve damage, especially in diabetics), vascular disease, psychogenic, etc. We discussed the treatment ladder approach ranging from oral PDE5i meds, to vacuum erection devices, to intraurethral medication, to injection therapy, to prosthetics and their and presented them in order of increasing efficacy, but also increasing risk. We also discussed the role of androgen evaluation and replacement and that I typically reserve such work-ups for men with low-libido as significant issue. We concluded by mentioning the association of ED and serious cardiovascular disease (coronary atherosclerosis // CAD) with ED pre-dating CAD sometimes by a period of years and the non-guideline recommendations for dedicate CV evaluation in patients with new ED. After consideration of therapeutic options, the patient has elected to try Viagra 100 mg. I told him that if this was effective she could contact me and we could consider switching him to generic sildenafil. - 08/04/2017, Erectile dysfunction due to arterial insufficiency, - 2016 Low back pain, May alternate ice/heat. 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 Follow-up cytology in 5/97 was negative.   Erectile dysfunction: He began having difficulty with this before 5/97 but at that time his condition had worsened. He has been on phosphodiesterase inhibitor therapy in the past.   Prostatitis: He has a history of prostatitis first diagnosed in 3/07 treated successfully with antibiotic  therapy.     NON-GU PMH: Constipation, unspecified - 12/24/2017, - 11/19/2017, - 10/28/2017 Muscle weakness (generalized) - 12/24/2017, - 11/19/2017, - 10/28/2017, - 10/15/2017 Other muscle spasm - 12/24/2017, - 11/19/2017, - 10/28/2017, - 10/15/2017 Other lack of coordination - 10/15/2017 Encounter for general adult medical examination without abnormal findings, Encounter for preventive health examination - 2016 Personal history of other diseases of the circulatory system, History of hypertension - 2016 Personal history of other diseases of the digestive system, History of esophageal reflux - 2016 Personal history of other diseases of the nervous system and sense organs, H/O Bell's palsy - 2016 Personal history of other diseases of the respiratory system, History of asthma - 2016, History of chronic obstructive lung disease, - 2016 Personal history of other endocrine, nutritional and metabolic disease, History of hyperlipidemia - 2016 Unspecified macular degeneration, Macular degeneration, right eye - 2016    FAMILY HISTORY: Chronic Obstructive Pulmonary Disease - Runs In Family Lung Cancer - Runs In Family Thyroid Disease - Runs In Family   SOCIAL HISTORY: Marital Status: Married Preferred Language: English; Race: Black or African American Current Smoking Status: Patient smokes. Smokes 1/2 pack per day.   Tobacco Use Assessment Completed: Used Tobacco in last 30 days? Uses smokeless tobacco. Drinks 3 drinks per week. Moderate Drinker.  Does not use drugs. Drinks 4+ caffeinated drinks per day.    REVIEW OF SYSTEMS:    GU Review Male:   Patient denies  frequent urination, hard to postpone urination, burning/ pain with urination, get up at night to urinate, leakage of urine, stream starts and stops, trouble starting your stream, have to strain to urinate , erection problems, and penile pain.  Gastrointestinal (Upper):   Patient denies nausea, vomiting, and indigestion/ heartburn.   Gastrointestinal (Lower):   Patient denies diarrhea and constipation.  Constitutional:   Patient reports weight loss and fatigue. Patient denies fever and night sweats.  Skin:   Patient denies skin rash/ lesion and itching.  Eyes:   Patient denies blurred vision and double vision.  Ears/ Nose/ Throat:   Patient denies sore throat and sinus problems.  Hematologic/Lymphatic:   Patient denies swollen glands and easy bruising.  Cardiovascular:   Patient denies leg swelling and chest pains.  Respiratory:   Patient denies cough and shortness of breath.  Endocrine:   Patient denies excessive thirst.  Musculoskeletal:   Patient denies back pain and joint pain.  Neurological:   Patient denies headaches and dizziness.  Psychologic:   Patient denies depression and anxiety.   VITAL SIGNS:    Weight 212 lb / 96.16 kg  Height 72 in / 182.88 cm  BP 131/71 mmHg  Pulse 76 /min  BMI 28.7 kg/m   Physical Exam  Constitutional: Well nourished and well developed . No acute distress.   ENT:. The ears and nose are normal in appearance.   Neck: The appearance of the neck is normal and no neck mass is present.   Pulmonary: No respiratory distress and normal respiratory rhythm and effort.   Cardiovascular: Heart rate and rhythm are normal . No peripheral edema.   Abdomen: The abdomen is mildly obese. The abdomen is soft and nontender. No masses are palpated. No CVA tenderness. No hernias are palpable. No hepatosplenomegaly noted.   Rectal: Rectal exam demonstrates normal sphincter tone, no tenderness and no masses. The prostate has no nodularity and is tender. The left seminal vesicle is nonpalpable. The right seminal vesicle is nonpalpable. The perineum is normal on inspection.   Genitourinary: Examination of the penis demonstrates no discharge, no masses, no lesions and a normal meatus. The scrotum is without lesions. The right epididymis is palpably normal and non-tender. The left epididymis is  palpably normal and non-tender. The right testis is non-tender and without masses. The left testis is non-tender and without masses.   Lymphatics: The femoral and inguinal nodes are not enlarged or tender.   Skin: Normal skin turgor, no visible rash and no visible skin lesions.   Neuro/Psych:. Mood and affect are appropriate.    PAST DATA REVIEWED:  Source Of History:  Patient  Notes:                     We went over the results of the urodynamics study today. I discussed each of the findings of the study and the normal/anticipated results as well as the significance of these results in relation to the subjective symptoms.   Urodynamics 02/17/18: Study was performed to evaluate urinary retention.   He was found to have a maximum cystometric capacity of 560 cc with 1st sensation occurring at 218 cc but no instability.  Pressure-flow: He generated a voluntary contraction and voided 525 cc with a maximum flow rate of 11 cc/2nd and a detrusor pressure at maximum flow of 30 cm H2O  His EMG was normal.  Fluoroscopy revealed some trabeculation but otherwise was unremarkable.   Impression: He has a slightly elevated capacity with  some slight loss of compliance but does not appear to have significant obstruction. It may be that a combination of some weakening of his detrusor contraction and some outlet obstruction has resulted in his intermittent urinary retention. He likely would benefit from maximum medical management for outlet obstruction with 0.8 mg tamsulosin and finasterid e verses TURP.     PROCEDURES: None   ASSESSMENT/PLAN:       ICD-10 Details  1 GU:   Urinary Retention - We discussed the options based on his urodynamic study and I told him about a suprapubic tube while awaiting on maximum medical management to see if he begins to void spontaneously. I also discussed self catheterization but he did not want to consider that. I discussed outlet resistance surgery and primarily  transurethral resection of the prostate. I went over this procedure with him in detail including how this procedure is performed, I drew him pictures so he could better understand the anatomy and the effects of the surgery, we discussed the potential risks and complications as well as the probability of success and the need for an overnight stay. We also discussed the anticipated postoperative course.   He has elected to proceed with a transurethral resection of the prostate. I had him come in 1 week prior to his planned surgery in order to obtain a catheterized specimen for culture.

## 2018-03-17 NOTE — Discharge Instructions (Signed)

## 2018-03-19 ENCOUNTER — Encounter (HOSPITAL_BASED_OUTPATIENT_CLINIC_OR_DEPARTMENT_OTHER): Payer: Self-pay | Admitting: *Deleted

## 2018-03-19 ENCOUNTER — Ambulatory Visit (HOSPITAL_BASED_OUTPATIENT_CLINIC_OR_DEPARTMENT_OTHER): Payer: Medicare Other | Admitting: Anesthesiology

## 2018-03-19 ENCOUNTER — Ambulatory Visit (HOSPITAL_BASED_OUTPATIENT_CLINIC_OR_DEPARTMENT_OTHER)
Admission: RE | Admit: 2018-03-19 | Discharge: 2018-03-20 | Disposition: A | Discharge status: Home / Self Care | Payer: Medicare Other | Source: Ambulatory Visit | Attending: Urology | Admitting: Urology

## 2018-03-19 ENCOUNTER — Other Ambulatory Visit: Payer: Self-pay

## 2018-03-19 ENCOUNTER — Encounter (HOSPITAL_BASED_OUTPATIENT_CLINIC_OR_DEPARTMENT_OTHER)
Admission: RE | Disposition: A | Discharge status: Home / Self Care | Payer: Self-pay | Source: Ambulatory Visit | Attending: Urology

## 2018-03-19 DIAGNOSIS — Z825 Family history of asthma and other chronic lower respiratory diseases: Secondary | ICD-10-CM | POA: Diagnosis not present

## 2018-03-19 DIAGNOSIS — I10 Essential (primary) hypertension: Secondary | ICD-10-CM | POA: Insufficient documentation

## 2018-03-19 DIAGNOSIS — Z801 Family history of malignant neoplasm of trachea, bronchus and lung: Secondary | ICD-10-CM | POA: Diagnosis not present

## 2018-03-19 DIAGNOSIS — Z79899 Other long term (current) drug therapy: Secondary | ICD-10-CM | POA: Insufficient documentation

## 2018-03-19 DIAGNOSIS — F419 Anxiety disorder, unspecified: Secondary | ICD-10-CM | POA: Insufficient documentation

## 2018-03-19 DIAGNOSIS — F17200 Nicotine dependence, unspecified, uncomplicated: Secondary | ICD-10-CM | POA: Insufficient documentation

## 2018-03-19 DIAGNOSIS — Z91041 Radiographic dye allergy status: Secondary | ICD-10-CM | POA: Insufficient documentation

## 2018-03-19 DIAGNOSIS — Z881 Allergy status to other antibiotic agents status: Secondary | ICD-10-CM | POA: Diagnosis not present

## 2018-03-19 DIAGNOSIS — R338 Other retention of urine: Secondary | ICD-10-CM | POA: Diagnosis not present

## 2018-03-19 DIAGNOSIS — N138 Other obstructive and reflux uropathy: Secondary | ICD-10-CM | POA: Insufficient documentation

## 2018-03-19 DIAGNOSIS — N400 Enlarged prostate without lower urinary tract symptoms: Secondary | ICD-10-CM | POA: Diagnosis present

## 2018-03-19 DIAGNOSIS — N401 Enlarged prostate with lower urinary tract symptoms: Secondary | ICD-10-CM | POA: Insufficient documentation

## 2018-03-19 HISTORY — DX: Retention of urine, unspecified: R33.9

## 2018-03-19 HISTORY — DX: Presence of other specified devices: Z97.8

## 2018-03-19 HISTORY — PX: TRANSURETHRAL RESECTION OF PROSTATE: SHX73

## 2018-03-19 HISTORY — DX: Benign prostatic hyperplasia without lower urinary tract symptoms: N40.0

## 2018-03-19 HISTORY — DX: Pain in unspecified shoulder: M25.519

## 2018-03-19 HISTORY — DX: Presence of urogenital implants: Z96.0

## 2018-03-19 HISTORY — DX: Anxiety disorder, unspecified: F41.9

## 2018-03-19 LAB — POCT I-STAT 4, (NA,K, GLUC, HGB,HCT)
Glucose, Bld: 91 mg/dL (ref 65–99)
HCT: 45 % (ref 39.0–52.0)
Hemoglobin: 15.3 g/dL (ref 13.0–17.0)
Potassium: 5.4 mmol/L — ABNORMAL HIGH (ref 3.5–5.1)
Sodium: 140 mmol/L (ref 135–145)

## 2018-03-19 SURGERY — TURP (TRANSURETHRAL RESECTION OF PROSTATE)
Anesthesia: General

## 2018-03-19 MED ORDER — BELLADONNA ALKALOIDS-OPIUM 16.2-60 MG RE SUPP
1.0000 | Freq: Four times a day (QID) | RECTAL | Status: DC | PRN
  Administered 2018-03-19: 1 via RECTAL
  Filled 2018-03-19: qty 1

## 2018-03-19 MED ORDER — TRAMADOL HCL 50 MG PO TABS: 50.0000 mg | ORAL_TABLET | Freq: Four times a day (QID) | ORAL | 0 refills | Status: DC | PRN

## 2018-03-19 MED ORDER — MEPERIDINE HCL 25 MG/ML IJ SOLN
6.2500 mg | INTRAMUSCULAR | Status: DC | PRN
  Filled 2018-03-19: qty 1

## 2018-03-19 MED ORDER — DEXAMETHASONE SODIUM PHOSPHATE 10 MG/ML IJ SOLN
INTRAMUSCULAR | Status: AC
  Filled 2018-03-19: qty 1

## 2018-03-19 MED ORDER — LIDOCAINE 2% (20 MG/ML) 5 ML SYRINGE
INTRAMUSCULAR | Status: AC
  Filled 2018-03-19: qty 5

## 2018-03-19 MED ORDER — ONDANSETRON HCL 4 MG/2ML IJ SOLN
INTRAMUSCULAR | Status: AC
  Filled 2018-03-19: qty 2

## 2018-03-19 MED ORDER — HYDROMORPHONE HCL 1 MG/ML IJ SOLN
0.2500 mg | INTRAMUSCULAR | Status: DC | PRN
  Administered 2018-03-19: 0.25 mg via INTRAVENOUS
  Filled 2018-03-19: qty 0.5

## 2018-03-19 MED ORDER — BELLADONNA-OPIUM 16.2-30 MG RE SUPP
RECTAL | Status: AC
  Filled 2018-03-19: qty 1

## 2018-03-19 MED ORDER — HYDROCODONE-ACETAMINOPHEN 5-325 MG PO TABS
ORAL_TABLET | ORAL | Status: AC
Start: 2018-03-19 — End: ?
  Filled 2018-03-19: qty 1

## 2018-03-19 MED ORDER — SODIUM CHLORIDE 0.9 % IV SOLN
INTRAVENOUS | Status: AC
  Filled 2018-03-19: qty 100

## 2018-03-19 MED ORDER — MIDAZOLAM HCL 2 MG/2ML IJ SOLN
INTRAMUSCULAR | Status: DC | PRN
  Administered 2018-03-19: 2 mg via INTRAVENOUS

## 2018-03-19 MED ORDER — ACETAMINOPHEN 10 MG/ML IV SOLN
INTRAVENOUS | Status: AC
  Filled 2018-03-19: qty 100

## 2018-03-19 MED ORDER — SODIUM CHLORIDE 0.9 % IV SOLN
2.0000 g | INTRAVENOUS | Status: AC
  Filled 2018-03-19: qty 20

## 2018-03-19 MED ORDER — HYDROMORPHONE HCL 1 MG/ML IJ SOLN
0.5000 mg | INTRAMUSCULAR | Status: DC | PRN
  Filled 2018-03-19: qty 1

## 2018-03-19 MED ORDER — LIDOCAINE 2% (20 MG/ML) 5 ML SYRINGE
INTRAMUSCULAR | Status: DC | PRN
  Administered 2018-03-19: 100 mg via INTRAVENOUS

## 2018-03-19 MED ORDER — HYDROMORPHONE HCL 1 MG/ML IJ SOLN
INTRAMUSCULAR | Status: AC
  Filled 2018-03-19: qty 1

## 2018-03-19 MED ORDER — ONDANSETRON HCL 4 MG/2ML IJ SOLN
4.0000 mg | INTRAMUSCULAR | Status: DC | PRN
  Filled 2018-03-19: qty 2

## 2018-03-19 MED ORDER — PROPOFOL 10 MG/ML IV BOLUS
INTRAVENOUS | Status: AC
  Filled 2018-03-19: qty 20

## 2018-03-19 MED ORDER — ACETAMINOPHEN 500 MG PO TABS
1000.0000 mg | ORAL_TABLET | Freq: Four times a day (QID) | ORAL | Status: DC
  Filled 2018-03-19: qty 2

## 2018-03-19 MED ORDER — FENTANYL CITRATE (PF) 100 MCG/2ML IJ SOLN
INTRAMUSCULAR | Status: AC
  Filled 2018-03-19: qty 2

## 2018-03-19 MED ORDER — SODIUM CHLORIDE 0.9 % IV SOLN
INTRAVENOUS | Status: DC
  Administered 2018-03-19 (×2): via INTRAVENOUS
  Filled 2018-03-19 (×2): qty 1000

## 2018-03-19 MED ORDER — CEFTRIAXONE SODIUM 2 G IJ SOLR
2.0000 g | Freq: Once | INTRAMUSCULAR | Status: AC
  Administered 2018-03-19: 2 g via INTRAVENOUS
  Filled 2018-03-19: qty 20

## 2018-03-19 MED ORDER — HYDROCODONE-ACETAMINOPHEN 5-325 MG PO TABS
1.0000 | ORAL_TABLET | ORAL | Status: DC | PRN
  Administered 2018-03-19: 2 via ORAL
  Administered 2018-03-19: 1 via ORAL
  Filled 2018-03-19: qty 2

## 2018-03-19 MED ORDER — ONDANSETRON HCL 4 MG/2ML IJ SOLN
4.0000 mg | Freq: Once | INTRAMUSCULAR | Status: DC | PRN
  Filled 2018-03-19: qty 2

## 2018-03-19 MED ORDER — FENTANYL CITRATE (PF) 100 MCG/2ML IJ SOLN
INTRAMUSCULAR | Status: DC | PRN
  Administered 2018-03-19: 100 ug via INTRAVENOUS
  Administered 2018-03-19 (×2): 50 ug via INTRAVENOUS

## 2018-03-19 MED ORDER — CEFTRIAXONE SODIUM 2 G IJ SOLR
INTRAMUSCULAR | Status: AC
  Filled 2018-03-19: qty 20

## 2018-03-19 MED ORDER — SODIUM CHLORIDE 0.9 % IR SOLN
3000.0000 mL | Status: DC
  Administered 2018-03-19 (×2): 3000 mL
  Filled 2018-03-19: qty 3000

## 2018-03-19 MED ORDER — PHENAZOPYRIDINE HCL 200 MG PO TABS: 200.0000 mg | ORAL_TABLET | Freq: Three times a day (TID) | ORAL | 0 refills | Status: DC | PRN

## 2018-03-19 MED ORDER — ACETAMINOPHEN 10 MG/ML IV SOLN
INTRAVENOUS | Status: DC | PRN
  Administered 2018-03-19: 1000 mg via INTRAVENOUS

## 2018-03-19 MED ORDER — FENTANYL CITRATE (PF) 100 MCG/2ML IJ SOLN
INTRAMUSCULAR | Status: AC
  Filled 2018-03-19: qty 4

## 2018-03-19 MED ORDER — PROPOFOL 10 MG/ML IV BOLUS
INTRAVENOUS | Status: DC | PRN
  Administered 2018-03-19: 150 mg via INTRAVENOUS

## 2018-03-19 MED ORDER — SODIUM CHLORIDE 0.9 % IV SOLN
INTRAVENOUS | Status: DC
  Administered 2018-03-19: 08:00:00 via INTRAVENOUS
  Filled 2018-03-19: qty 1000

## 2018-03-19 MED ORDER — DEXAMETHASONE SODIUM PHOSPHATE 10 MG/ML IJ SOLN
INTRAMUSCULAR | Status: DC | PRN
  Administered 2018-03-19: 10 mg via INTRAVENOUS

## 2018-03-19 MED ORDER — ACETAMINOPHEN 325 MG PO TABS
650.0000 mg | ORAL_TABLET | ORAL | Status: DC | PRN
  Filled 2018-03-19: qty 2

## 2018-03-19 MED ORDER — HYDROCODONE-ACETAMINOPHEN 5-325 MG PO TABS
ORAL_TABLET | ORAL | Status: AC
  Filled 2018-03-19: qty 2

## 2018-03-19 MED ORDER — BACITRACIN-NEOMYCIN-POLYMYXIN 400-5-5000 EX OINT
1.0000 "application " | TOPICAL_OINTMENT | Freq: Three times a day (TID) | CUTANEOUS | Status: DC | PRN
  Filled 2018-03-19: qty 1

## 2018-03-19 MED ORDER — ONDANSETRON HCL 4 MG/2ML IJ SOLN
INTRAMUSCULAR | Status: DC | PRN
  Administered 2018-03-19: 4 mg via INTRAVENOUS

## 2018-03-19 MED ORDER — MIDAZOLAM HCL 2 MG/2ML IJ SOLN
INTRAMUSCULAR | Status: AC
  Filled 2018-03-19: qty 2

## 2018-03-19 SURGICAL SUPPLY — 29 items
BAG DRAIN URO-CYSTO SKYTR STRL (DRAIN) ×2 IMPLANT
BAG DRN ANRFLXCHMBR STRAP LEK (BAG)
BAG DRN UROCATH (DRAIN) ×1
BAG URINE DRAINAGE (UROLOGICAL SUPPLIES) ×1 IMPLANT
BAG URINE LEG 19OZ MD ST LTX (BAG) IMPLANT
CATH FOLEY 3WAY 20FR (CATHETERS) ×1 IMPLANT
CATH FOLEY 3WAY 30CC 20FR (CATHETERS) IMPLANT
CATH FOLEY 3WAY 30CC 24FR (CATHETERS)
CATH HEMA 3WAY 30CC 24FR COUDE (CATHETERS) IMPLANT
CATH HEMA 3WAY 30CC 24FR RND (CATHETERS) IMPLANT
CATH URTH STD 24FR FL 3W 2 (CATHETERS) IMPLANT
CLOTH BEACON ORANGE TIMEOUT ST (SAFETY) ×2 IMPLANT
ELECT REM PT RETURN 9FT ADLT (ELECTROSURGICAL)
ELECTRODE REM PT RTRN 9FT ADLT (ELECTROSURGICAL) IMPLANT
EVACUATOR MICROVAS BLADDER (UROLOGICAL SUPPLIES) ×2 IMPLANT
GLOVE BIO SURGEON STRL SZ8 (GLOVE) ×2 IMPLANT
GOWN STRL REUS W/TWL LRG LVL3 (GOWN DISPOSABLE) IMPLANT
GOWN STRL REUS W/TWL XL LVL3 (GOWN DISPOSABLE) ×2 IMPLANT
HOLDER FOLEY CATH W/STRAP (MISCELLANEOUS) IMPLANT
IV NS IRRIG 3000ML ARTHROMATIC (IV SOLUTION) IMPLANT
KIT TURNOVER CYSTO (KITS) ×2 IMPLANT
LOOP CUT BIPOLAR 24F LRG (ELECTROSURGICAL) IMPLANT
MANIFOLD NEPTUNE II (INSTRUMENTS) ×2 IMPLANT
PACK CYSTO (CUSTOM PROCEDURE TRAY) ×2 IMPLANT
PLUG CATH AND CAP STER (CATHETERS) IMPLANT
SYR 30ML LL (SYRINGE) IMPLANT
SYRINGE IRR TOOMEY STRL 70CC (SYRINGE) IMPLANT
TUBE CONNECTING 12X1/4 (SUCTIONS) IMPLANT
WATER STERILE IRR 3000ML UROMA (IV SOLUTION) IMPLANT

## 2018-03-19 NOTE — Transfer of Care (Signed)
Immediate Anesthesia Transfer of Care Note  Patient: Tim GranaHugh Passow  Procedure(s) Performed: TRANSURETHRAL RESECTION OF THE PROSTATE (TURP) (N/A )  Patient Location: PACU  Anesthesia Type:General  Level of Consciousness: awake, alert , oriented and patient cooperative  Airway & Oxygen Therapy: Patient Spontanous Breathing and Patient connected to nasal cannula oxygen  Post-op Assessment: Report given to RN and Post -op Vital signs reviewed and stable  Post vital signs: Reviewed and stable  Last Vitals:  Vitals Value Taken Time  BP    Temp    Pulse 78 03/19/2018 10:41 AM  Resp    SpO2 100 % 03/19/2018 10:41 AM    Last Pain:  Vitals:   03/19/18 0825  TempSrc:   PainSc: 0-No pain         Complications: No apparent anesthesia complications

## 2018-03-19 NOTE — Op Note (Signed)
PATIENT:  Tim Thomas  PRE-OPERATIVE DIAGNOSIS: BPH with outlet obstruction  POST-OPERATIVE DIAGNOSIS: Same  PROCEDURE: TURP  SURGEON:  Garnett FarmMark C Kinneth Fujiwara  INDICATION: Tim GranaHugh Lipke is a 67 year old male with a history of outlet obstruction who developed urinary retention on 02/03/18.  Despite attempts at medical management he was unsuccessful at voiding and also has a past history of urinary retention.  He underwent urodynamics which revealed no instability and a voluntary contraction with detrusor pressures of 30 cm H2O.  We discussed the options and he is elected to proceed with transurethral resection of his prostate.  ANESTHESIA:  General  EBL:  Minimal  DRAINS: 20 French three-way Foley catheter  SPECIMEN:  Prostate chips to pathology  After informed consent the patient was brought to the major OR and placed on the table. He was administered general anesthesia and then moved to the dorsal lithotomy position. His genitalia was sterilely prepped and draped and an official timeout was then performed.  Initially the 28 French resectoscope sheath with the visual obturator was passed into the bladder and the obturator removed. I then inserted the resectoscope element with 30 lens and  performed a systematic inspection of the bladder. I noted the bladder had 2+ trabeculation but was free of any tumor stones or inflammatory lesions. The ureteral orifices were noted to be of normal configuration and position. Withdrawing the scope into the prostatic urethra I noted obstructing bilobar hypertrophy with slight elongation of the prostatic urethra and a minimal median lobe component.  Resection was then begun I. first resected the median lobe in the midline down to the level of the bladder neck. I then began resecting the left lobe of the prostate by resecting first from the level of the bladder neck back to the level of the Veru at the 5:00 position and then progressed in a counterclockwise  direction resecting all of the adenomatous tissue of the left lobe down to the surgical capsule. Bleeding points were cauterized as they were encountered. I then turned my attention to the right lobe of the prostate and it was resected in an identical fashion. Tissue in the area of the apex was then resected circumferentially with care being taken to maintain the resection proximal to the Veru at all times. The prostatic chips were then flushed into the bladder and the Microvasive evacuator was then used to evacuate all chips from the bladder. Reinspection of the bladder revealed the mucosa to be intact, the ureteral orifices intact as well and well away from the bladder neck and area of resection. There were no prostatic chips remaining within the bladder. The prostatic capsule was intact throughout with no perforation and there was no active bleeding noted at the end of the procedure.  The resectoscope was therefore removed and the 20 French three-way Foley catheter was then inserted and balloon was filled to 30 cc. This was placed on mild traction and the bladder was irrigated with the irrigant returning clear. The catheter was then hooked to closed system drainage and continuous irrigation and the patient was awakened and taken to recovery room in stable and satisfactory condition. He tolerated the procedure well and there were no intraoperative complications.  PLAN OF CARE: Observation overnight with anticipated discharge in the morning.  PATIENT DISPOSITION:  PACU - Hemodynamically stable.

## 2018-03-19 NOTE — Anesthesia Postprocedure Evaluation (Signed)
Anesthesia Post Note  Patient: Tim Thomas  Procedure(s) Performed: TRANSURETHRAL RESECTION OF THE PROSTATE (TURP) (N/A )     Patient location during evaluation: PACU Anesthesia Type: General Level of consciousness: awake and alert Pain management: pain level controlled Vital Signs Assessment: post-procedure vital signs reviewed and stable Respiratory status: spontaneous breathing, nonlabored ventilation, respiratory function stable and patient connected to nasal cannula oxygen Cardiovascular status: blood pressure returned to baseline and stable Postop Assessment: no apparent nausea or vomiting Anesthetic complications: no    Last Vitals:  Vitals:   03/19/18 1100 03/19/18 1115  BP: (!) 113/59 (!) 130/58  Pulse: 64 70  Resp: 15 16  Temp:    SpO2: 100% 100%    Last Pain:  Vitals:   03/19/18 1115  TempSrc:   PainSc: (P) 5                  Gurnoor Sloop DAVID

## 2018-03-19 NOTE — Anesthesia Procedure Notes (Signed)
Procedure Name: LMA Insertion Date/Time: 03/19/2018 9:49 AM Performed by: Tyrone NineSauve, Kineta Fudala F, CRNA Pre-anesthesia Checklist: Patient identified, Timeout performed, Emergency Drugs available, Suction available and Patient being monitored Patient Re-evaluated:Patient Re-evaluated prior to induction Oxygen Delivery Method: Circle system utilized Preoxygenation: Pre-oxygenation with 100% oxygen Induction Type: IV induction Ventilation: Mask ventilation without difficulty LMA: LMA inserted LMA Size: 5.0 Number of attempts: 1 Airway Equipment and Method: Bite block Placement Confirmation: positive ETCO2,  CO2 detector and breath sounds checked- equal and bilateral Tube secured with: Tape Dental Injury: Teeth and Oropharynx as per pre-operative assessment

## 2018-03-19 NOTE — Anesthesia Preprocedure Evaluation (Addendum)
Anesthesia Evaluation  Patient identified by MRN, date of birth, ID band  Reviewed: Allergy & Precautions, NPO status , Patient's Chart, lab work & pertinent test results  Airway Mallampati: I  TM Distance: >3 FB Neck ROM: Full    Dental  (+) Teeth Intact, Dental Advisory Given, Caps, Chipped,    Pulmonary asthma , COPD, Current Smoker,    Pulmonary exam normal        Cardiovascular hypertension, Pt. on medications Normal cardiovascular exam     Neuro/Psych Anxiety    GI/Hepatic   Endo/Other    Renal/GU      Musculoskeletal   Abdominal   Peds  Hematology   Anesthesia Other Findings   Reproductive/Obstetrics                            Anesthesia Physical Anesthesia Plan  ASA: III  Anesthesia Plan: General   Post-op Pain Management:    Induction: Intravenous  PONV Risk Score and Plan: 1 and Ondansetron  Airway Management Planned: LMA  Additional Equipment:   Intra-op Plan:   Post-operative Plan: Extubation in OR  Informed Consent: I have reviewed the patients History and Physical, chart, labs and discussed the procedure including the risks, benefits and alternatives for the proposed anesthesia with the patient or authorized representative who has indicated his/her understanding and acceptance.     Plan Discussed with: CRNA and Surgeon  Anesthesia Plan Comments:         Anesthesia Quick Evaluation

## 2018-03-20 NOTE — Discharge Summary (Signed)
  Physician Discharge Summary      Patient ID: Allegra GranaHugh Eland MRN: [id number] DOB/AGE: 68/06/1951 68 y.o.  Admit date: 03/19/2018 Discharge date: 03/20/2018  Admission Diagnoses: BENIGN PROSTATIC HYPERPLASIA, URINARY RETENTION  Discharge Diagnoses: BPH with urinary retention  Discharged Condition: good  Hospital Course: He was admitted for an elective transurethral resection of his prostate due to BPH with urinary retention that did not respond to pharmacologic therapy.  His surgery proceeded without complication or difficulty and postoperatively he was noted to be doing well with slightly pink urine on CBI.  He was having some very mild bladder spasms.  Overnight he did well.  His Foley catheter has been removed.  He will be discharged home once he voids.  He had been taking Bactrim prior to his admission and therefore will finish his prescription.  He will follow-up with me in a week.  Significant Diagnostic Studies: No results found.  Discharge Exam: Blood pressure 122/61, pulse 68, temperature 98 F (36.7 C), temperature source Oral, resp. rate 16, height 5' 11.5" (1.816 m), weight 97 kg (213 lb 12.8 oz), SpO2 99 %. Awake, alert in no distress. Normal respiratory effort Cardiovascular regular rate and rhythm Abdomen soft and nontender.  Disposition: Discharge disposition: 01-Home or Self Care       Discharge Instructions    Discharge patient   Complete by:  As directed    Discharge disposition:  01-Home or Self Care   Discharge patient date:  03/20/2018      Follow-up Information    [facility] On 04/02/2018.   Why:  For your appiontment at 9:45 Contact information: [address] [place] [id number] [phone]          Signed: Garnett FarmOTTELIN,Shakeitha Umbaugh C 03/20/2018, 7:52 AM

## 2018-03-22 ENCOUNTER — Encounter (HOSPITAL_BASED_OUTPATIENT_CLINIC_OR_DEPARTMENT_OTHER): Payer: Self-pay | Admitting: Urology

## 2018-05-06 ENCOUNTER — Encounter (INDEPENDENT_AMBULATORY_CARE_PROVIDER_SITE_OTHER): Payer: Medicare Other | Admitting: Ophthalmology

## 2018-05-06 DIAGNOSIS — I10 Essential (primary) hypertension: Secondary | ICD-10-CM | POA: Diagnosis not present

## 2018-05-06 DIAGNOSIS — H43813 Vitreous degeneration, bilateral: Secondary | ICD-10-CM

## 2018-05-06 DIAGNOSIS — H2513 Age-related nuclear cataract, bilateral: Secondary | ICD-10-CM | POA: Diagnosis not present

## 2018-05-06 DIAGNOSIS — H353231 Exudative age-related macular degeneration, bilateral, with active choroidal neovascularization: Secondary | ICD-10-CM | POA: Diagnosis not present

## 2018-05-06 DIAGNOSIS — H35033 Hypertensive retinopathy, bilateral: Secondary | ICD-10-CM

## 2018-07-01 ENCOUNTER — Encounter (INDEPENDENT_AMBULATORY_CARE_PROVIDER_SITE_OTHER): Payer: Medicare Other | Admitting: Ophthalmology

## 2018-07-01 DIAGNOSIS — H35033 Hypertensive retinopathy, bilateral: Secondary | ICD-10-CM

## 2018-07-01 DIAGNOSIS — H353231 Exudative age-related macular degeneration, bilateral, with active choroidal neovascularization: Secondary | ICD-10-CM | POA: Diagnosis not present

## 2018-07-01 DIAGNOSIS — I10 Essential (primary) hypertension: Secondary | ICD-10-CM

## 2018-07-01 DIAGNOSIS — H43813 Vitreous degeneration, bilateral: Secondary | ICD-10-CM | POA: Diagnosis not present

## 2018-08-26 ENCOUNTER — Encounter (INDEPENDENT_AMBULATORY_CARE_PROVIDER_SITE_OTHER): Payer: Medicare Other | Admitting: Ophthalmology

## 2018-08-26 DIAGNOSIS — H353231 Exudative age-related macular degeneration, bilateral, with active choroidal neovascularization: Secondary | ICD-10-CM

## 2018-08-26 DIAGNOSIS — I10 Essential (primary) hypertension: Secondary | ICD-10-CM | POA: Diagnosis not present

## 2018-08-26 DIAGNOSIS — H43813 Vitreous degeneration, bilateral: Secondary | ICD-10-CM | POA: Diagnosis not present

## 2018-08-26 DIAGNOSIS — H35033 Hypertensive retinopathy, bilateral: Secondary | ICD-10-CM

## 2018-10-01 ENCOUNTER — Ambulatory Visit (INDEPENDENT_AMBULATORY_CARE_PROVIDER_SITE_OTHER): Payer: Medicare Other | Admitting: Pulmonary Disease

## 2018-10-01 ENCOUNTER — Encounter: Payer: Self-pay | Admitting: Pulmonary Disease

## 2018-10-01 VITALS — BP 130/80 | HR 64 | Ht 70.0 in | Wt 226.0 lb

## 2018-10-01 DIAGNOSIS — R05 Cough: Secondary | ICD-10-CM | POA: Diagnosis not present

## 2018-10-01 DIAGNOSIS — J449 Chronic obstructive pulmonary disease, unspecified: Secondary | ICD-10-CM

## 2018-10-01 DIAGNOSIS — Z23 Encounter for immunization: Secondary | ICD-10-CM | POA: Diagnosis not present

## 2018-10-01 DIAGNOSIS — R059 Cough, unspecified: Secondary | ICD-10-CM

## 2018-10-01 DIAGNOSIS — Z720 Tobacco use: Secondary | ICD-10-CM | POA: Diagnosis not present

## 2018-10-01 MED ORDER — FLUTICASONE FUROATE-VILANTEROL 100-25 MCG/INH IN AEPB: 1.0000 | INHALATION_SPRAY | Freq: Every day | RESPIRATORY_TRACT | 5 refills | Status: DC

## 2018-10-01 MED ORDER — VARENICLINE TARTRATE 0.5 MG X 11 & 1 MG X 42 PO MISC: ORAL | 0 refills | Status: DC

## 2018-10-01 MED ORDER — ALBUTEROL SULFATE HFA 108 (90 BASE) MCG/ACT IN AERS: 2.0000 | INHALATION_SPRAY | Freq: Four times a day (QID) | RESPIRATORY_TRACT | 3 refills | Status: DC | PRN

## 2018-10-01 NOTE — Progress Notes (Signed)
[facility]  Chief Complaint  Patient presents with  . Follow-up    Pt is having increase of SOB with exertion, wheezing and prod cough-clear in last month. Pt is requesting flu shot today.     Constitutional:  BP 130/80 (BP Location: Left Arm, Cuff Size: Normal)   Pulse 64   Ht 5\' 10"  (1.778 m)   Wt 226 lb (102.5 kg)   SpO2 98%   BMI 32.43 kg/m   Past Medical History:  HTN, HLD, Prostatitis  Brief Summary:  Tim Thomas is a 67 y.o. male smoker with COPD/asthma.  He had TURP about a year ago.  Was able to quit smoking after this, and was off cigarettes for few months.  He started smoking again. He tried chantix years ago, but had bad dreams.  Otherwise tolerated medicine okay.  Has nicotine gum.  Has cough with chest congestion.  Feels tight in his chest at times.  Started using flovent and albuterol again, but these were expired inhalers.    Not having fever, sinus congestion, abdominal pain, hemoptysis, skin rash, or leg swelling.   Physical Exam:   Appearance - well kempt  ENMT - clear nasal mucosa, midline nasal septum, no oral exudates, no LAN, trachea midline Respiratory - normal chest wall, normal respiratory effort, no accessory muscle use, no wheeze/rales CV - s1s2 regular rate and rhythm, no murmurs, no peripheral edema, radial pulses symmetric GI - soft, non tender, no masses Lymph - no adenopathy noted in neck and axillary areas MSK - normal muscle strength and tone, normal gait Ext - no cyanosis, clubbing, or joint inflammation noted Skin - no rashes, lesions, or ulcers Neuro - oriented to person, place, and time Psych - normal mood and affect   Assessment/Plan:   COPD with asthma. - will have him try Breo one puff daily and prn albuterol - high dose flu shot today  Tobacco abuse. - reviewed options to assist with smoking cessation - he is willing to try chantix again >> discussed dosing regimen, setting  quit date, and side effects to monitor for   Patient Instructions  Breo one puff daily, and rinse mouth after each use Albuterol two puffs every 6 hours as needed for cough, wheeze, or chest congestion Will prescribe chantix High dose flu shot today Follow up in 2 months    Coralyn Helling, MD [facility] Pager: [phone] 10/01/2018, 10:01 AM  Flow Sheet     Pulmonary tests:  PFT 05/12/12>>FEV1 3.37(99%), FEV1% 68, TLC 7.33(103%), DLCO 116%, +BD Spirometry 08/20/15 >> FEV1 3.30 (105%), FEV1% 76 Low dose CT chest 09/02/16 >> paraseptal emphysema, bronchial wall thickening Spirometry 10/01/18 >> FEV1 3.2 (108%), FEV1% 73  Medications:   Allergies as of 10/01/2018      Reactions   Bee Venom Swelling   Facial and sit of sting swelling Has EPi-pen   Ciprofloxacin Other (See Comments)   Neck stiffness and pains   Ivp Dye [iodinated Diagnostic Agents] Hives, Other (See Comments)   Pt is allergic to contrast material injected by an opthamologist, not contrast used by radiology///a.calhoun Benadryl works for prophylaxis      Medication List        Accurate as of 10/01/18 10:01 AM. Always use your most recent med list.          albuterol 108 (90 Base) MCG/ACT inhaler Commonly known as:  PROVENTIL HFA;VENTOLIN HFA Inhale 2 puffs into the lungs every 6 (six) hours as  needed for wheezing or shortness of breath.   amLODipine 10 MG tablet Commonly known as:  NORVASC Take 10 mg by mouth daily.   EPINEPHrine 0.3 mg/0.3 mL Soaj injection Commonly known as:  EPI-PEN Inject 0.3 mLs (0.3 mg total) into the muscle once.   fluticasone furoate-vilanterol 100-25 MCG/INH Aepb Commonly known as:  BREO ELLIPTA Inhale 1 puff into the lungs daily.   LINZESS 145 MCG Caps capsule Generic drug:  linaclotide Take 145 mg by mouth daily as needed (for constipation).   rosuvastatin 20 MG tablet Commonly known as:  CRESTOR Take 20 mg by mouth daily.   varenicline  0.5 MG X 11 & 1 MG X 42 tablet Commonly known as:  CHANTIX PAK Take one 0.5 mg tablet by mouth once daily for 3 days, then increase to one 0.5 mg tablet twice daily for 4 days, then increase to one 1 mg tablet twice daily.       Past Surgical History:  He  has a past surgical history that includes left ring finger surgery (1970's); Tonsillectomy (as child); colonscopy (few yrs ago); sigmoidscopy; divertilosis; and Transurethral resection of prostate (N/A, 03/19/2018).  Family History:  His family history includes COPD in his mother; Lung cancer in his mother; Thyroid disease in his sister.  Social History:  He  reports that he has been smoking. He has a 75.00 pack-year smoking history. He has never used smokeless tobacco. He reports that he drinks alcohol. He reports that he does not use drugs.

## 2018-10-01 NOTE — Patient Instructions (Signed)
Breo one puff daily, and rinse mouth after each use Albuterol two puffs every 6 hours as needed for cough, wheeze, or chest congestion Will prescribe chantix High dose flu shot today Follow up in 2 months

## 2018-10-21 ENCOUNTER — Encounter (INDEPENDENT_AMBULATORY_CARE_PROVIDER_SITE_OTHER): Payer: Medicare Other | Admitting: Ophthalmology

## 2018-10-21 DIAGNOSIS — H35033 Hypertensive retinopathy, bilateral: Secondary | ICD-10-CM | POA: Diagnosis not present

## 2018-10-21 DIAGNOSIS — I10 Essential (primary) hypertension: Secondary | ICD-10-CM | POA: Diagnosis not present

## 2018-10-21 DIAGNOSIS — H353231 Exudative age-related macular degeneration, bilateral, with active choroidal neovascularization: Secondary | ICD-10-CM | POA: Diagnosis not present

## 2018-10-21 DIAGNOSIS — H43813 Vitreous degeneration, bilateral: Secondary | ICD-10-CM

## 2018-10-21 DIAGNOSIS — H2513 Age-related nuclear cataract, bilateral: Secondary | ICD-10-CM

## 2018-12-06 ENCOUNTER — Encounter: Payer: Self-pay | Admitting: Pulmonary Disease

## 2018-12-06 ENCOUNTER — Ambulatory Visit (INDEPENDENT_AMBULATORY_CARE_PROVIDER_SITE_OTHER): Payer: Medicare Other | Admitting: Pulmonary Disease

## 2018-12-06 VITALS — BP 130/76 | HR 71 | Ht 71.0 in | Wt 231.0 lb

## 2018-12-06 DIAGNOSIS — J449 Chronic obstructive pulmonary disease, unspecified: Secondary | ICD-10-CM

## 2018-12-06 DIAGNOSIS — M25512 Pain in left shoulder: Secondary | ICD-10-CM | POA: Diagnosis not present

## 2018-12-06 NOTE — Patient Instructions (Signed)
Follow up in 1 year.

## 2018-12-06 NOTE — Progress Notes (Signed)
[facility]  Chief Complaint  Patient presents with  . Follow-up    Pt is doing well overall, pt states coughing is better.    Constitutional:  BP 130/76 (BP Location: Left Arm, Cuff Size: Normal)   Pulse 71   Ht 5\' 11"  (1.803 m)   Wt 231 lb (104.8 kg)   SpO2 98%   BMI 32.22 kg/m   Past Medical History:  HTN, HLD, Prostatitis, Macular degeneration, BPH  Brief Summary:  Tim Thomas is a 67 y.o. male former smoker with COPD/asthma.  He quit smoking several weeks ago.  Breathing better.  Not having cough, wheeze, sputum, or chest pain.  Not needing to use breo on daily basis any more.  Still uses sometimes.  He is still using nicotine gum.  He has gained weight since he stopped smoking.  He noticed more pain in his left shoulder after he got flu shot.  He started using meloxicam recently and this has helped.   Physical Exam:   Appearance - well kempt   ENMT - no sinus tenderness, no nasal discharge, no oral exudate  Neck - no masses, trachea midline, no thyromegaly, no elevation in JVP  Respiratory - normal appearance of chest wall, normal respiratory effort w/o accessory muscle use, no dullness on percussion, no tactile fremitus, no wheezing or rales  CV - s1s2 regular rate and rhythm, no murmurs, no peripheral edema, no varicosities, radial pulses symmetric  GI - soft, non tender, no masses, no hepatosplenomegaly  Lymph - no adenopathy noted in neck and axillary areas  MSK - normal gait  Ext - no cyanosis, clubbing, or joint inflammation noted  Skin - no rashes, lesions, or ulcers  Neuro - normal strength, oriented x 3  Psych - normal mood and affect    Assessment/Plan:   COPD with asthma. - better since he stopped smoking - can use prn albuterol - if he needs to use breo more regularly, then could try changing him to just ICS w/o LABA  Left shoulder pain. - continue prn meloxicam for now - advised him to call  or f/u with PCP if this persists   Patient Instructions  Follow up in 1 year    Coralyn Helling, MD  Pulmonary/Critical Care Pager: [phone] 12/06/2018, 10:59 AM  Flow Sheet     Pulmonary tests:  PFT 05/12/12>>FEV1 3.37(99%), FEV1% 68, TLC 7.33(103%), DLCO 116%, +BD Spirometry 08/20/15 >> FEV1 3.30 (105%), FEV1% 76 Low dose CT chest 09/02/16 >> paraseptal emphysema, bronchial wall thickening Spirometry 10/01/18 >> FEV1 3.2 (108%), FEV1% 73  Medications:   Allergies as of 12/06/2018      Reactions   Bee Venom Swelling   Facial and sit of sting swelling Has EPi-pen   Ciprofloxacin Other (See Comments)   Neck stiffness and pains   Ivp Dye [iodinated Diagnostic Agents] Hives, Other (See Comments)   Pt is allergic to contrast material injected by an opthamologist, not contrast used by radiology///a.calhoun Benadryl works for prophylaxis      Medication List       Accurate as of December 06, 2018 10:59 AM. Always use your most recent med list.        albuterol 108 (90 Base) MCG/ACT inhaler Commonly known as:  PROAIR HFA Inhale 2 puffs into the lungs every 6 (six) hours as needed for wheezing or shortness of breath.   amLODipine 10 MG tablet Commonly known as:  NORVASC Take 10 mg by mouth daily.  EPINEPHrine 0.3 mg/0.3 mL Soaj injection Commonly known as:  EPI-PEN Inject 0.3 mLs (0.3 mg total) into the muscle once.   fluticasone furoate-vilanterol 100-25 MCG/INH Aepb Commonly known as:  BREO ELLIPTA Inhale 1 puff into the lungs daily.   LINZESS 145 MCG Caps capsule Generic drug:  linaclotide Take 145 mg by mouth daily as needed (for constipation).   rosuvastatin 20 MG tablet Commonly known as:  CRESTOR Take 20 mg by mouth daily.       Past Surgical History:  He  has a past surgical history that includes left ring finger surgery (1970's); Tonsillectomy (as child); colonscopy (few yrs ago); sigmoidscopy; divertilosis; and Transurethral resection  of prostate (N/A, 03/19/2018).  Family History:  His family history includes COPD in his mother; Lung cancer in his mother; Thyroid disease in his sister.  Social History:  He  reports that he quit smoking about 7 weeks ago. He has a 75.00 pack-year smoking history. He has never used smokeless tobacco. He reports current alcohol use. He reports that he does not use drugs.

## 2018-12-23 ENCOUNTER — Encounter (INDEPENDENT_AMBULATORY_CARE_PROVIDER_SITE_OTHER): Payer: Medicare Other | Admitting: Ophthalmology

## 2018-12-23 DIAGNOSIS — H353231 Exudative age-related macular degeneration, bilateral, with active choroidal neovascularization: Secondary | ICD-10-CM | POA: Diagnosis not present

## 2018-12-23 DIAGNOSIS — H35033 Hypertensive retinopathy, bilateral: Secondary | ICD-10-CM

## 2018-12-23 DIAGNOSIS — H43813 Vitreous degeneration, bilateral: Secondary | ICD-10-CM | POA: Diagnosis not present

## 2018-12-23 DIAGNOSIS — I10 Essential (primary) hypertension: Secondary | ICD-10-CM | POA: Diagnosis not present

## 2019-02-24 ENCOUNTER — Encounter (INDEPENDENT_AMBULATORY_CARE_PROVIDER_SITE_OTHER): Payer: Medicare Other | Admitting: Ophthalmology

## 2019-02-24 DIAGNOSIS — I10 Essential (primary) hypertension: Secondary | ICD-10-CM

## 2019-02-24 DIAGNOSIS — H35033 Hypertensive retinopathy, bilateral: Secondary | ICD-10-CM

## 2019-02-24 DIAGNOSIS — H2513 Age-related nuclear cataract, bilateral: Secondary | ICD-10-CM

## 2019-02-24 DIAGNOSIS — H353231 Exudative age-related macular degeneration, bilateral, with active choroidal neovascularization: Secondary | ICD-10-CM | POA: Diagnosis not present

## 2019-02-24 DIAGNOSIS — H43813 Vitreous degeneration, bilateral: Secondary | ICD-10-CM | POA: Diagnosis not present

## 2019-03-14 ENCOUNTER — Telehealth: Payer: Self-pay | Admitting: Pulmonary Disease

## 2019-03-14 NOTE — Telephone Encounter (Signed)
Unable to reach LMTCB 

## 2019-03-15 NOTE — Telephone Encounter (Signed)
3 weeks ago chest tightness, sob, hx of bronchitis. No fevers Pt took some old doxycyline that he had on hand. He started to feel slightly improved but decided to call pcp in which another abx Ceftin was called in. He completed 7 days Ceftin yesterday. He is using Breo once daily and [employer].   Pt requesting another round of abx.due to having chest pain/discomfort and sob. Please advise.

## 2019-03-15 NOTE — Telephone Encounter (Signed)
Please convert to televisit and send to Angus Seller NP

## 2019-03-15 NOTE — Telephone Encounter (Signed)
Called number given below that patient would like to be called on. When answered male was upset and stated to stop calling this was the wrong number. Called patient on other phone number provided. Patient was not there, male stated that she would have him call back.

## 2019-03-16 NOTE — Telephone Encounter (Signed)
Spoke with the pt  I offered televisit  He declined, stating that he is feeling much improved this morning  Will call back if needed  Lamar Laundry- can you please try and close this encounter? When I tried it says that that there is an incomplete note by you, thanks!

## 2019-03-21 ENCOUNTER — Emergency Department (HOSPITAL_COMMUNITY)
Admission: EM | Admit: 2019-03-21 | Discharge: 2019-03-21 | Disposition: A | Discharge status: Home / Self Care | Payer: Medicare Other | Attending: Emergency Medicine | Admitting: Emergency Medicine

## 2019-03-21 ENCOUNTER — Emergency Department (HOSPITAL_COMMUNITY): Payer: Medicare Other

## 2019-03-21 ENCOUNTER — Other Ambulatory Visit: Payer: Self-pay

## 2019-03-21 ENCOUNTER — Encounter (HOSPITAL_COMMUNITY): Payer: Self-pay

## 2019-03-21 DIAGNOSIS — J45909 Unspecified asthma, uncomplicated: Secondary | ICD-10-CM | POA: Diagnosis not present

## 2019-03-21 DIAGNOSIS — I10 Essential (primary) hypertension: Secondary | ICD-10-CM | POA: Insufficient documentation

## 2019-03-21 DIAGNOSIS — Z87891 Personal history of nicotine dependence: Secondary | ICD-10-CM | POA: Diagnosis not present

## 2019-03-21 DIAGNOSIS — J441 Chronic obstructive pulmonary disease with (acute) exacerbation: Secondary | ICD-10-CM | POA: Insufficient documentation

## 2019-03-21 DIAGNOSIS — R0602 Shortness of breath: Secondary | ICD-10-CM | POA: Diagnosis present

## 2019-03-21 LAB — COMPREHENSIVE METABOLIC PANEL
ALT: 21 U/L (ref 0–44)
AST: 19 U/L (ref 15–41)
Albumin: 4.7 g/dL (ref 3.5–5.0)
Alkaline Phosphatase: 71 U/L (ref 38–126)
Anion gap: 12 (ref 5–15)
BUN: 24 mg/dL — ABNORMAL HIGH (ref 8–23)
CO2: 19 mmol/L — ABNORMAL LOW (ref 22–32)
Calcium: 9.7 mg/dL (ref 8.9–10.3)
Chloride: 109 mmol/L (ref 98–111)
Creatinine, Ser: 1.12 mg/dL (ref 0.61–1.24)
GFR calc Af Amer: >60 mL/min (ref >60)
GFR calc non Af Amer: >60 mL/min (ref >60)
Glucose, Bld: 87 mg/dL (ref 70–99)
Potassium: 3.2 mmol/L — ABNORMAL LOW (ref 3.5–5.1)
Sodium: 140 mmol/L (ref 135–145)
Total Bilirubin: 1.3 mg/dL — ABNORMAL HIGH (ref 0.3–1.2)
Total Protein: 7.8 g/dL (ref 6.5–8.1)

## 2019-03-21 LAB — CBC WITH DIFFERENTIAL/PLATELET
ABS IMMATURE GRANULOCYTES: 0.01 10*3/uL (ref 0.00–0.07)
Basophils Absolute: 0.1 10*3/uL (ref 0.0–0.1)
Basophils Relative: 1 %
EOS ABS: 0.1 10*3/uL (ref 0.0–0.5)
Eosinophils Relative: 2 %
HCT: 49 % (ref 39.0–52.0)
Hemoglobin: 15.9 g/dL (ref 13.0–17.0)
IMMATURE GRANULOCYTES: 0 %
Lymphocytes Relative: 36 %
Lymphs Abs: 2.6 10*3/uL (ref 0.7–4.0)
MCH: 29.7 pg (ref 26.0–34.0)
MCHC: 32.4 g/dL (ref 30.0–36.0)
MCV: 91.4 fL (ref 80.0–100.0)
Monocytes Absolute: 0.7 10*3/uL (ref 0.1–1.0)
Monocytes Relative: 9 %
NEUTROS PCT: 52 %
Neutro Abs: 3.7 10*3/uL (ref 1.7–7.7)
PLATELETS: 280 10*3/uL (ref 150–400)
RBC: 5.36 MIL/uL (ref 4.22–5.81)
RDW: 13.9 % (ref 11.5–15.5)
WBC: 7.2 10*3/uL (ref 4.0–10.5)
nRBC: 0 % (ref 0.0–0.2)

## 2019-03-21 LAB — BRAIN NATRIURETIC PEPTIDE: B NATRIURETIC PEPTIDE 5: 17.2 pg/mL (ref 0.0–100.0)

## 2019-03-21 LAB — LACTIC ACID, PLASMA: Lactic Acid, Venous: 1.8 mmol/L (ref 0.5–1.9)

## 2019-03-21 LAB — TROPONIN I
Troponin I: <0.03 ng/mL (ref <0.03)
Troponin I: <0.03 ng/mL (ref <0.03)

## 2019-03-21 LAB — D-DIMER, QUANTITATIVE: D-Dimer, Quant: 0.75 ug/mL-FEU — ABNORMAL HIGH (ref 0.00–0.50)

## 2019-03-21 MED ORDER — ALBUTEROL SULFATE HFA 108 (90 BASE) MCG/ACT IN AERS
2.0000 | INHALATION_SPRAY | Freq: Once | RESPIRATORY_TRACT | Status: AC
  Administered 2019-03-21: 2 via RESPIRATORY_TRACT
  Filled 2019-03-21: qty 6.7

## 2019-03-21 MED ORDER — DIPHENHYDRAMINE HCL 50 MG/ML IJ SOLN
50.0000 mg | Freq: Once | INTRAMUSCULAR | Status: AC
  Administered 2019-03-21: 50 mg via INTRAVENOUS
  Filled 2019-03-21: qty 1

## 2019-03-21 MED ORDER — DIPHENHYDRAMINE HCL 25 MG PO CAPS
50.0000 mg | ORAL_CAPSULE | Freq: Once | ORAL | Status: AC
  Filled 2019-03-21: qty 2

## 2019-03-21 MED ORDER — METHYLPREDNISOLONE SODIUM SUCC 125 MG IJ SOLR
60.0000 mg | Freq: Once | INTRAMUSCULAR | Status: AC
  Administered 2019-03-21: 60 mg via INTRAVENOUS
  Filled 2019-03-21: qty 2

## 2019-03-21 MED ORDER — PREDNISONE 50 MG PO TABS: ORAL_TABLET | ORAL | 0 refills | Status: DC

## 2019-03-21 MED ORDER — IOHEXOL 350 MG/ML SOLN
100.0000 mL | Freq: Once | INTRAVENOUS | Status: AC | PRN
  Administered 2019-03-21: 100 mL via INTRAVENOUS

## 2019-03-21 MED ORDER — MAGNESIUM SULFATE 2 GM/50ML IV SOLN
2.0000 g | Freq: Once | INTRAVENOUS | Status: AC
  Administered 2019-03-21: 2 g via INTRAVENOUS
  Filled 2019-03-21: qty 50

## 2019-03-21 MED ORDER — DOXYCYCLINE HYCLATE 100 MG PO CAPS: 100.0000 mg | ORAL_CAPSULE | Freq: Two times a day (BID) | ORAL | 0 refills | Status: DC

## 2019-03-21 MED ORDER — SODIUM CHLORIDE (PF) 0.9 % IJ SOLN
INTRAMUSCULAR | Status: AC
  Filled 2019-03-21: qty 50

## 2019-03-21 NOTE — ED Notes (Signed)
Patient given discharge teaching and verbalized understanding. Patient ambulated out of ED with a steady gait. 

## 2019-03-21 NOTE — ED Triage Notes (Signed)
Pt has been having a cough and SOB for approx 3 weeks. Has been on 1 round of ABX that was prescribed via telemedicine without relief. Pt still having tightness in chest, SOB and cough.

## 2019-03-21 NOTE — Discharge Instructions (Addendum)
Your testing is negative for heart attack or blood clot in the lung. Take the antibiotics and steroids as prescribed. Followup with you doctor. Maintain home quarantine until you have no cough or fever for 7 days.  Return to the ED with chest pain, shortness of breath or any other concerns.     Person Under Monitoring Name: Tim Thomas  Location: [address] [place] [address]   Infection Prevention Recommendations for Individuals Confirmed to have, or Being Evaluated for, 2019 Novel Coronavirus (COVID-19) Infection Who Receive Care at Home  Individuals who are confirmed to have, or are being evaluated for, COVID-19 should follow the prevention steps below until a healthcare provider or local or state health department says they can return to normal activities.  Stay home except to get medical care You should restrict activities outside your home, except for getting medical care. Do not go to work, school, or public areas, and do not use public transportation or taxis.  Call ahead before visiting your doctor Before your medical appointment, call the healthcare provider and tell them that you have, or are being evaluated for, COVID-19 infection. This will help the healthcare providers office take steps to keep other people from getting infected. Ask your healthcare provider to call the local or state health department.  Monitor your symptoms Seek prompt medical attention if your illness is worsening (e.g., difficulty breathing). Before going to your medical appointment, call the healthcare provider and tell them that you have, or are being evaluated for, COVID-19 infection. Ask your healthcare provider to call the local or state health department.  Wear a facemask You should wear a facemask that covers your nose and mouth when you are in the same room with other people and when you visit a healthcare provider. People who live with or visit you should also wear a  facemask while they are in the same room with you.  Separate yourself from other people in your home As much as possible, you should stay in a different room from other people in your home. Also, you should use a separate bathroom, if available.  Avoid sharing household items You should not share dishes, drinking glasses, cups, eating utensils, towels, bedding, or other items with other people in your home. After using these items, you should wash them thoroughly with soap and water.  Cover your coughs and sneezes Cover your mouth and nose with a tissue when you cough or sneeze, or you can cough or sneeze into your sleeve. Throw used tissues in a lined trash can, and immediately wash your hands with soap and water for at least 20 seconds or use an alcohol-based hand rub.  Wash your [employer] your hands often and thoroughly with soap and water for at least 20 seconds. You can use an alcohol-based hand sanitizer if soap and water are not available and if your hands are not visibly dirty. Avoid touching your eyes, nose, and mouth with unwashed hands.   Prevention Steps for Caregivers and Household Members of Individuals Confirmed to have, or Being Evaluated for, COVID-19 Infection Being Cared for in the Home  If you live with, or provide care at home for, a person confirmed to have, or being evaluated for, COVID-19 infection please follow these guidelines to prevent infection:  Follow healthcare providers instructions Make sure that you understand and can help the patient follow any healthcare provider instructions for all care.  Provide for the patients basic needs You should help the patient  with basic needs in the home and provide support for getting groceries, prescriptions, and other personal needs.  Monitor the patients symptoms If they are getting sicker, call his or her medical provider and tell them that the patient has, or is being evaluated for, COVID-19 infection.  This will help the healthcare providers office take steps to keep other people from getting infected. Ask the healthcare provider to call the local or state health department.  Limit the number of people who have contact with the patient If possible, have only one caregiver for the patient. Other household members should stay in another home or place of residence. If this is not possible, they should stay in another room, or be separated from the patient as much as possible. Use a separate bathroom, if available. Restrict visitors who do not have an essential need to be in the home.  Keep older adults, very young children, and other sick people away from the patient Keep older adults, very young children, and those who have compromised immune systems or chronic health conditions away from the patient. This includes people with chronic heart, lung, or kidney conditions, diabetes, and cancer.  Ensure good ventilation Make sure that shared spaces in the home have good air flow, such as from an air conditioner or an opened window, weather permitting.  Wash your hands often Wash your hands often and thoroughly with soap and water for at least 20 seconds. You can use an alcohol based hand sanitizer if soap and water are not available and if your hands are not visibly dirty. Avoid touching your eyes, nose, and mouth with unwashed hands. Use disposable paper towels to dry your hands. If not available, use dedicated cloth towels and replace them when they become wet.  Wear a facemask and gloves Wear a disposable facemask at all times in the room and gloves when you touch or have contact with the patients blood, body fluids, and/or secretions or excretions, such as sweat, saliva, sputum, nasal mucus, vomit, urine, or feces.  Ensure the mask fits over your nose and mouth tightly, and do not touch it during use. Throw out disposable facemasks and gloves after using them. Do not reuse. Wash your hands  immediately after removing your facemask and gloves. If your personal clothing becomes contaminated, carefully remove clothing and launder. Wash your hands after handling contaminated clothing. Place all used disposable facemasks, gloves, and other waste in a lined container before disposing them with other household waste. Remove gloves and wash your hands immediately after handling these items.  Do not share dishes, glasses, or other household items with the patient Avoid sharing household items. You should not share dishes, drinking glasses, cups, eating utensils, towels, bedding, or other items with a patient who is confirmed to have, or being evaluated for, COVID-19 infection. After the person uses these items, you should wash them thoroughly with soap and water.  Wash laundry thoroughly Immediately remove and wash clothes or bedding that have blood, body fluids, and/or secretions or excretions, such as sweat, saliva, sputum, nasal mucus, vomit, urine, or feces, on them. Wear gloves when handling laundry from the patient. Read and follow directions on labels of laundry or clothing items and detergent. In general, wash and dry with the warmest temperatures recommended on the label.  Clean all areas the individual has used often Clean all touchable surfaces, such as counters, tabletops, doorknobs, bathroom fixtures, toilets, phones, keyboards, tablets, and bedside tables, every day. Also, clean any surfaces that may  have blood, body fluids, and/or secretions or excretions on them. Wear gloves when cleaning surfaces the patient has come in contact with. Use a diluted bleach solution (e.g., dilute bleach with 1 part bleach and 10 parts water) or a household disinfectant with a label that says EPA-registered for coronaviruses. To make a bleach solution at home, add 1 tablespoon of bleach to 1 quart (4 cups) of water. For a larger supply, add  cup of bleach to 1 gallon (16 cups) of water. Read  labels of cleaning products and follow recommendations provided on product labels. Labels contain instructions for safe and effective use of the cleaning product including precautions you should take when applying the product, such as wearing gloves or eye protection and making sure you have good ventilation during use of the product. Remove gloves and wash hands immediately after cleaning.  Monitor yourself for signs and symptoms of illness Caregivers and household members are considered close contacts, should monitor their health, and will be asked to limit movement outside of the home to the extent possible. Follow the monitoring steps for close contacts listed on the symptom monitoring form.   ? If you have additional questions, contact your local health department or call the epidemiologist on call at [phone] (available 24/7). ? This guidance is subject to change. For the most up-to-date guidance from [facility], please refer to their website: [email or web address]

## 2019-03-21 NOTE — ED Provider Notes (Addendum)
[facility]-EMERGENCY DEPT Provider Note   CSN: [id number] Arrival date & time: 03/21/19  0214    History   Chief Complaint Chief Complaint  Patient presents with  . Shortness of Breath  . Cough    HPI Tim Thomas is a 68 y.o. male.     Patient with history of anxiety and COPD presenting by EMS with difficulty breathing that has been progressively worsening since March 12.  States his breathing became acutely worse tonight associate with bilateral rib tightness and coughing.  His wife called EMS because he was having increased difficulty breathing.  States he was prescribed cefdinir about 1 week ago for pneumonia which she finished.  He was not seen in the office with this visit.  Continues to take pro-air, Breo and Spiriva for his breathing.  States he is not had a fever.  His cough is nonproductive.  He has bilateral rib pain with coughing only.  No recent sick contacts or travel.  No known covid exposures.  No leg pain or leg swelling.  No abdominal pain, nausea or vomiting.  EMS reports no hypoxia.  Still with tachypnea and tightness in his chest.  The history is provided by the EMS personnel and the patient.  Shortness of Breath  Associated symptoms: cough   Associated symptoms: no abdominal pain, no fever, no headaches and no vomiting   Cough  Associated symptoms: shortness of breath   Associated symptoms: no fever, no headaches and no myalgias     Past Medical History:  Diagnosis Date  . Anxiety   . BPH (benign prostatic hyperplasia)   . COPD (chronic obstructive pulmonary disease) (HCC)   . 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 Foley catheter in place    02-17-18, occ clots in bag  . High cholesterol   . Hypertension   . Prostate enlargement   . Shoulder pain    right chronic  . Urinary retention     august  2018  . Wet senile macular degeneration (HCC)     right eye current tx, left eye tx in past    Patient Active Problem List   Diagnosis Date Noted  . BPH without urinary obstruction 03/19/2018  . Snoring 07/26/2013  . Tobacco abuse 01/04/2013  . 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 colonscopy  few yrs ago   x 2 polyps removed  . 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 left ring finger surgery  1970's  . sigmoidscopy    . TONSILLECTOMY  as child  . TRANSURETHRAL RESECTION OF PROSTATE N/A 03/19/2018   Procedure: TRANSURETHRAL RESECTION OF THE PROSTATE (TURP);  Surgeon: Ihor Gully, MD;  Location: [facility];  Service: Urology;  Laterality: N/A;        Home Medications    Prior to Admission medications   Medication Sig Start Date End Date Taking? Authorizing Provider  albuterol (PROAIR HFA) 108 (90 Base) MCG/ACT inhaler Inhale 2 puffs into the lungs every 6 (six) hours as needed for wheezing or shortness of breath. 10/01/18   Coralyn Helling, MD  amLODipine (NORVASC) 10 MG tablet Take 10 mg by mouth daily.     [provider]  EPINEPHrine 0.3 mg/0.3 mL IJ SOAJ injection Inject 0.3 mLs (0.3 mg total) into the muscle once. Patient taking differently: Inject 0.3 mg into the muscle as needed (for allergies).  08/05/15   Linna Hoff, MD  fluticasone furoate-vilanterol (BREO ELLIPTA) 100-25 MCG/INH AEPB Inhale 1 puff into the lungs daily. 10/01/18   Coralyn Helling, MD  LINZESS 145 MCG CAPS capsule Take 145 mg by mouth daily as needed (for constipation).  08/27/17   [provider]  rosuvastatin (CRESTOR) 20 MG tablet Take 20 mg by mouth daily.     [provider]    Family History Family History  Problem Relation Age of Onset  . Lung cancer Mother   . COPD Mother   . Thyroid disease Sister     Social History Social History   Tobacco Use  . Smoking status: Former Smoker    Packs/day: 1.50    Years: 50.00    Pack years: 75.00    Last attempt to quit: 10/15/2018    Years since quitting: 0.4  . Smokeless  tobacco: Never Used  Substance Use Topics  . Alcohol use: Yes    Alcohol/week: 0.0 standard drinks    Comment: none since Dec 23, 2017  . Drug use: No     Allergies   Bee venom; Ciprofloxacin; and Ivp dye [iodinated diagnostic agents]   Review of Systems Review of Systems  Constitutional: Positive for activity change and appetite change. Negative for fever.  HENT: Positive for congestion.   Eyes: Negative for visual disturbance.  Respiratory: Positive for cough, chest tightness and shortness of breath.   Gastrointestinal: Negative for abdominal pain, nausea and vomiting.  Genitourinary: Negative for dysuria and hematuria.  Musculoskeletal: Negative for arthralgias and myalgias.  Neurological: Negative for dizziness, weakness, light-headedness and headaches.    all other systems are negative except as noted in the HPI and PMH.    Physical Exam Updated Vital Signs BP (!) 162/96 (BP Location: Left Arm)   Pulse 68   Temp 97.9 F (36.6 C) (Oral)   Resp (!) 26   Ht  (1.803 m)   Wt 104.3 kg   SpO2 100%   BMI 32.08 kg/m   Physical Exam Vitals signs and nursing note reviewed.  Constitutional:      General: He is in acute distress.     Appearance: He is well-developed. He is obese.     Comments: Moderate respiratory distress, dyspneic with conversation  HENT:     Head: Normocephalic and atraumatic.     Nose: Nose normal. No congestion or rhinorrhea.     Mouth/Throat:     Mouth: Mucous membranes are moist.     Pharynx: No oropharyngeal exudate.  Eyes:     Conjunctiva/sclera: Conjunctivae normal.     Pupils: Pupils are equal, round, and reactive to light.  Neck:     Musculoskeletal: Normal range of motion and neck supple.     Comments: No meningismus. Cardiovascular:     Rate and Rhythm: Normal rate and regular rhythm.     Heart sounds: Normal heart sounds. No murmur.  Pulmonary:     Effort: Pulmonary effort is normal. No respiratory distress.     Breath  sounds: Wheezing present.     Comments: Diminished breath sounds throughout Increased work of breathing, Dyspnea with conversation Abdominal:     Palpations: Abdomen is soft.     Tenderness: There is no abdominal tenderness. There is no guarding or rebound.  Musculoskeletal: Normal range of motion.        General: No tenderness.  Skin:    General: Skin is warm.     Capillary Refill: Capillary refill takes less than 2 seconds.  Neurological:  General: No focal deficit present.     Mental Status: He is alert and oriented to person, place, and time. Mental status is at baseline.     Cranial Nerves: No cranial nerve deficit.     Motor: No abnormal muscle tone.     Coordination: Coordination normal.     Comments: No ataxia on finger to nose bilaterally. No pronator drift. 5/5 strength throughout. CN 2-12 intact.Equal grip strength. Sensation intact.   Psychiatric:        Behavior: Behavior normal.      ED Treatments / Results  Labs (all labs ordered are listed, but only abnormal results are displayed) Labs Reviewed  COMPREHENSIVE METABOLIC PANEL - Abnormal; Notable for the following components:      Result Value   Potassium 3.2 (*)    CO2 19 (*)    BUN 24 (*)    Total Bilirubin 1.3 (*)    All other components within normal limits  D-DIMER, QUANTITATIVE (NOT AT [facility]) - Abnormal; Notable for the following components:   D-Dimer, Quant 0.75 (*)    All other components within normal limits  CULTURE, BLOOD (ROUTINE X 2)  CULTURE, BLOOD (ROUTINE X 2)  CBC WITH DIFFERENTIAL/PLATELET  TROPONIN I  BRAIN NATRIURETIC PEPTIDE  LACTIC ACID, PLASMA  TROPONIN I  LACTIC ACID, PLASMA    EKG EKG Interpretation  Date/Time:  Monday March 21 2019 02:21:32 EDT Ventricular Rate:  68 PR Interval:    QRS Duration: 97 QT Interval:  388 QTC Calculation: 413 R Axis:   92 Text Interpretation:  Sinus or ectopic atrial rhythm Borderline prolonged PR interval Right axis deviation No  significant change was found Confirmed by Glynn Octave [phone]) on 03/21/2019 2:56:37 AM   Radiology Ct Angio Chest Pe W And/or Wo Contrast  Result Date: 03/21/2019 CLINICAL DATA:  Shortness of breath for weeks EXAM: CT ANGIOGRAPHY CHEST WITH CONTRAST TECHNIQUE: Multidetector CT imaging of the chest was performed using the standard protocol during bolus administration of intravenous contrast. Multiplanar CT image reconstructions and MIPs were obtained to evaluate the vascular anatomy. CONTRAST:  OMNIPAQUE IOHEXOL 350 MG/ML SOLN COMPARISON:  09/01/2016 FINDINGS: Cardiovascular: Satisfactory opacification of the pulmonary arteries to the segmental level. No evidence of pulmonary embolism. Normal heart size. No pericardial effusion. Mediastinum/Nodes: Negative for adenopathy Lungs/Pleura: There is no edema, consolidation, effusion, or pneumothorax. Upper Abdomen: Negative Musculoskeletal: Multilevel bridging thoracic osteophyte. No acute finding Review of the MIP images confirms the above findings. IMPRESSION: Negative for pulmonary embolism or other acute finding. Electronically Signed   By: Marnee Spring M.D.   On: 03/21/2019 07:17   Dg Chest Portable 1 View  Result Date: 03/21/2019 CLINICAL DATA:  68 year old male with shortness of breath. EXAM: PORTABLE CHEST 1 VIEW COMPARISON:  Chest CT dated 09/01/2016 FINDINGS: The lungs are clear. There is no pleural effusion or pneumothorax. The cardiac silhouette is within normal limits. Atherosclerotic calcification of the aortic arch. No acute osseous pathology. IMPRESSION: No active disease. Electronically Signed   By: Elgie Collard M.D.   On: 03/21/2019 03:00    Procedures Procedures (including critical care time)  Medications Ordered in ED Medications  albuterol (PROVENTIL HFA;VENTOLIN HFA) 108 (90 Base) MCG/ACT inhaler 2 puff (has no administration in time range)  methylPREDNISolone sodium succinate (SOLU-MEDROL) 125 mg/2 mL injection 60  mg (has no administration in time range)     Initial Impression / Assessment and Plan / ED Course  I have reviewed the triage vital signs and the nursing  notes.  Pertinent labs & imaging results that were available during my care of the patient were reviewed by me and considered in my medical decision making (see chart for details).       Patient presents with several weeks of shortness of breath and cough.  Has completed 1 course of antibiotics.  Comes in tonight with worsening shortness of breath right-sided chest pain with coughing and tachypnea.  Denies fever. Denies any recent travel or sick contacts.  No known coronavirus exposures.  Patient is tachypneic but not wheezing on exam.  Good air exchange.  He is not hypoxic.  Chest x-ray is negative. Patient given bronchodilators, magnesium and steroids.  States he feels improved.  EKG is sinus rhythm with negative troponin.  Suspicion for coronavirus is low at this time.  No fever or productive cough.  No known exposures or travel.  D-dimer slightly elevated.  We will proceed with CT angiogram after pretreatment for contrast allergy.  Possible admission discussed with Dr. Antionette Char.  Patient is not hypoxic and is not tachypneic or febrile.  He can likely be treated as an outpatient if his CT scan is negative.  CT negative for PE or pneumonia.  Patient ambulatory without desaturation.  Troponin negative x2.  Patient feels better.  He still appears mildly tachypneic but is not wheezing and not hypoxic.  He is resting comfortably with no increased work of breathing.  No hypoxia, productive cough or fever.  He does not meet corona testing criteria.  Patient comfortable discharge home with treatment for COPD exacerbation.  We will give antibiotics and steroids and continue bronchodilators. Patient given home quarantine instructions regarding possible coronavirus.  Follow-up with PCP.  Return precautions discussed.  Tim Thomas was  evaluated in Emergency Department on 03/21/2019 for the symptoms described in the history of present illness. He was evaluated in the context of the global COVID-19 pandemic, which necessitated consideration that the patient might be at risk for infection with the SARS-CoV-2 virus that causes COVID-19. Institutional protocols and algorithms that pertain to the evaluation of patients at risk for COVID-19 are in a state of rapid change based on information released by regulatory bodies including the CDC and federal and state organizations. These policies and algorithms were followed during the patient's care in the ED.  Final Clinical Impressions(s) / ED Diagnoses   Final diagnoses:  COPD exacerbation [facility])    ED Discharge Orders    None       Byanka Landrus, Jeannett Senior, MD 03/21/19 [id number]    Glynn Octave, MD 03/21/19 [phone]

## 2019-03-21 NOTE — ED Notes (Signed)
Bed: NG39 Expected date:  Expected time:  Means of arrival:  Comments: EMS 68 yo male possible Covid-pneumonia 3 weeks ago-albuterol tx at home NRB

## 2019-03-25 ENCOUNTER — Telehealth: Payer: Self-pay | Admitting: Pulmonary Disease

## 2019-03-25 MED ORDER — PREDNISONE 10 MG PO TABS: ORAL_TABLET | ORAL | 0 refills | Status: DC

## 2019-03-25 NOTE — Telephone Encounter (Signed)
Patient has had increased SOB this started on 03/03/19. He went to the ED on 03/20/19. Sob did get better then it has started back again 03/24/19.No fever at this time. He is also having pain in ribcage area on both sides. Had ct at hospital. He finish prednisone this morning.

## 2019-03-25 NOTE — Telephone Encounter (Signed)
Patient is aware and nothing else is needed at this time.

## 2019-03-25 NOTE — Telephone Encounter (Signed)
I have ordered another round of prednisone. If he becomes more short of breath over the weekend please go to the ED.

## 2019-03-26 LAB — CULTURE, BLOOD (ROUTINE X 2)
Culture: NO GROWTH
Culture: NO GROWTH
Special Requests: ADEQUATE

## 2019-04-08 ENCOUNTER — Ambulatory Visit (INDEPENDENT_AMBULATORY_CARE_PROVIDER_SITE_OTHER): Payer: Medicare Other | Admitting: Primary Care

## 2019-04-08 ENCOUNTER — Encounter: Payer: Self-pay | Admitting: Primary Care

## 2019-04-08 ENCOUNTER — Other Ambulatory Visit: Payer: Self-pay

## 2019-04-08 ENCOUNTER — Telehealth: Payer: Self-pay | Admitting: Pulmonary Disease

## 2019-04-08 DIAGNOSIS — J449 Chronic obstructive pulmonary disease, unspecified: Secondary | ICD-10-CM

## 2019-04-08 MED ORDER — FLUTICASONE PROPIONATE HFA 110 MCG/ACT IN AERO: 2.0000 | INHALATION_SPRAY | Freq: Two times a day (BID) | RESPIRATORY_TRACT | 12 refills | Status: DC

## 2019-04-08 NOTE — Telephone Encounter (Signed)
Patient returned call  Primary Pulmonologist: Sood Last office visit and with whom: 12.6.2019 w/ Sood What do we see them for (pulmonary problems): COPD w/ asthma  Reason for call: spoke with patient who reports persistent SHOB x6 weeks with occasional rib pain.  Patient reports PCP had treated symptoms w/ Omnicef, then he worsened and went via EMS to the ED >> CT Angio neg for PE or PNA, troponin neg x2.  Patient given bronchodilators and solumedrol and felt improved.  Patient then called the office on 4.3.2020 because symptoms had not completely resolved and was rx'd prednisone 10mg  taper #20.  Speaking with patient today he reports that he is even more improved but can still tell that his breathing is not back to baseline (patient not in any distress of audibly dyspneic on the phone) and is concerned.  Advised patient that because this has been going on for approx 6 weeks, a televisit would be prudent at this point >> televisit scheduled with Ames Dura NP for 1330 today.  Patient okay with this and voiced his understanding.  Requested that patient have his oximeter, thermometer and home BP cuff available at time of call.  Nothing further needed at this time.  In the last month, have you been in contact with someone who was confirmed or suspected to have Conoravirus / COVID-19?  no  Do you have any of the following symptoms developed in the last 30 days? Fever: n/a Cough: n/a Shortness of breath: yes  When did your symptoms start?  approx 6 weeks ago  If the patient has a fever, what is the last reading?  (use n/a if patient denies fever)  n/a . IF THE PATIENT STATES THEY DO NOT OWN A THERMOMETER, THEY MUST GO AND PURCHASE ONE When did the fever start?: n/a Have you taken any medication to suppress a fever (ie Ibuprofen, Aleve, Tylenol)?: n/a

## 2019-04-08 NOTE — Telephone Encounter (Signed)
LMOMTCB x 1 

## 2019-04-08 NOTE — Patient Instructions (Addendum)
STOP Breo  START Flovent- take 2 puffs twice daily scheduled (may use an additional third time if needed)  Continue Albuterol rescue inhaler 2 puffs every 4-6 hours as needed for breakthrough shortness of breath/wheezing   Recommend Zyrtec 10mg  daily over the counter if having any allergy symptoms (nasal congestion, runny nose, sneezing, itchy eyes, dry throat, cough)  Follow up in 3-4 weeks with NP E-visit

## 2019-04-08 NOTE — Progress Notes (Signed)
Virtual Visit via Telephone Note  I connected with Tim Thomas on 04/08/19 at  1:30 PM EDT by telephone and verified that I am speaking with the correct person using two identifiers.   I discussed the limitations, risks, security and privacy concerns of performing an evaluation and management service by telephone and the availability of in person appointments. I also discussed with the patient that there may be a patient responsible charge related to this service. The patient expressed understanding and agreed to proceed.   History of Present Illness: 68 year old male, former smoker. PMH significant for COPD with asthma. Patient of Dr. Craige Cotta, last seen on 12/06/18. Maintained on Breo, if needs to use more regularly recommended changing him to ICS without LABA. Called office on 3/23 with reports of chest tightness and sob x 3 weeks. Took some old doxycyline and started to feel somewhat better. Called PCP and was given additional Ceftin course. Patient went to ED on 03/20/19, d-dimer 0.75 and BNP 17. CTA negative for PE or PNA. Trop neg x 2. Shortness of breath did get better but returned on 03/24/19. Called office and was given additional course of oral steroids.    04/08/2019 Patient called office with complaints of shortness of breath x 6 weeks and occasional rib pain. Feels improved since prednisone course but not back to his baseline. States that he can take a breath but states he has to work at it. Some days he is fine. Some chest tightness and dry cough. After using his Breo inhaler he feels some relief but several hours later he notices he gets short of breath. Denies wheezing. Afebrile.    Observations/Objective:  O2- 97-99% HR- mid-90s  Assessment and Plan:  COPD with asthma: - Change Breo to ICS inhaler  - Take Flovent two puffs twice daily (may use additional third dose prn sob/wheezing) - Continue Albuterol rescue inhaler 2 puffs every 4-6 hours as needed for breakthrough  shortness of breath/wheezing  - Add Zyrtec 10mg  daily prn allergy symptoms     Follow Up Instructions:  - FU 3-4 weeks E-vist with NP - Dr. Craige Cotta in August    I discussed the assessment and treatment plan with the patient. The patient was provided an opportunity to ask questions and all were answered. The patient agreed with the plan and demonstrated an understanding of the instructions.   The patient was advised to call back or seek an in-person evaluation if the symptoms worsen or if the condition fails to improve as anticipated.  I provided 25 minutes of non-face-to-face time during this encounter.   Glenford Bayley, NP

## 2019-04-12 ENCOUNTER — Telehealth: Payer: Self-pay | Admitting: Primary Care

## 2019-04-12 MED ORDER — FLUTICASONE FUROATE-VILANTEROL 200-25 MCG/INH IN AEPB: 1.0000 | INHALATION_SPRAY | Freq: Every day | RESPIRATORY_TRACT | 6 refills | Status: AC

## 2019-04-12 MED ORDER — PREDNISONE 10 MG PO TABS: ORAL_TABLET | ORAL | 0 refills | Status: DC

## 2019-04-12 NOTE — Telephone Encounter (Signed)
Spoke with pt and relayed info.  Nothing further is needed.

## 2019-04-12 NOTE — Telephone Encounter (Signed)
He can go back on Breo, will increase to Breo 200. Will send prednisone taper. Follow up with Dr. Craige Cotta

## 2019-04-12 NOTE — Telephone Encounter (Signed)
Primary Pulmonologist: VS Last office visit and with whom: 04/08/2019 with Buelah Manis What do we see them for (pulmonary problems): COPD with asthma Last OV assessment/plan: Instructions  Return in about 4 weeks (around 05/06/2019).  STOP Breo  START Flovent- take 2 puffs twice daily scheduled (may use an additional third time if needed)  Continue Albuterol rescue inhaler 2 puffs every 4-6 hours as needed for breakthrough shortness of breath/wheezing   Recommend Zyrtec 10mg  daily over the counter if having any allergy symptoms (nasal congestion, runny nose, sneezing, itchy eyes, dry throat, cough)  Follow up in 3-4 weeks with NP E-visit        Was appointment offered to patient (explain)?  No, pt wants to know if he can start taking Breo again   Reason for call: Called and spoke with pt who stated he took Flovent yesterday and stated he began noticing tightness in chest yesterday morning shortly after taking it. Pt stated he also took it again 12 hours later and stated again shortly after taking the Flovent, he had more tightness in chest like he had before.  Pt stated this morning, he still had some mild tightness in chest and due to that, he did use his albuterol inhaler.  Pt stated years ago, he had been on Qvar but then the Qvar got to where it was not covered by insurance when pt was switched to medicare and pt was then switched to Flovent. Pt stated at that time, he was having problems with the Flovent and Dr. Craige Cotta had taken pt off of the Flovent then and told him not to take it now. Due to how the Flovent has made pt feel, he has not taken Flovent today and wants to get completely off of the Flovent. Pt is wanting to go back on the Breo due to that helping him.  If pt is okay to go back on the [place], pt wants to know if it is okay for him to go ahead and use it this morning as he has not had any inhaler today except for the albuterol which he used due to still having some tightness  in chest which was left over from the Flovent after taking it last night around 11pm.  Beth, please advise on all this for pt. Thanks!

## 2019-04-13 ENCOUNTER — Telehealth: Payer: Self-pay | Admitting: Primary Care

## 2019-04-13 NOTE — Telephone Encounter (Signed)
Called and spoke with pt letting him know that the Breo 200 was correct as Buelah Manis increased him from the [place] 100 that he was previously prescribed to Breo 200. Pt expressed understanding. Nothing further needed.

## 2019-04-27 ENCOUNTER — Encounter (INDEPENDENT_AMBULATORY_CARE_PROVIDER_SITE_OTHER): Payer: Medicare Other | Admitting: Ophthalmology

## 2019-05-03 ENCOUNTER — Telehealth: Payer: Self-pay | Admitting: Primary Care

## 2019-05-03 NOTE — Telephone Encounter (Signed)
Called and spoke with Patient.  Patient is seen by Dr Craige Cotta, for COPD, last OV was with EW 04/08/19. Patient stated that he has a upcoming tele visit with EW, 05/06/19.  Patient stated he has good days and bad days since last OV.  Patient stated he has increased SHOB, chest tightness, and rib pain.  Patient stated he had muscle spasms, rib, back pain, last Saturday night.  Patient stated he he checked his sats and the lowest was 92%.  Patient stated he called [facility] and had a Covid test last Weds. Results are not back, when he checked this morning.  Patient stated he thought his problems may be more then COPD, because he doesn't seem to be getting over this.  Patient stated he is keeping himself in 1 end of the house as much as possible, because he lives with his wife, his 11 year old Mother in [place], and his handicap Daughter.  Patient is wanting to know what EW would recommend OTC for anxiety?  Patient stated he was prescribed Doxycycline for dermatitis at his last ED visit, but has not taken it, because he is concerned that it may interfere with breathing, breathing medications. Patient would like to know if Doxycycline would be ok to take at this time?  Last OV with EW, 04/08/19- Instructions      Return in about 4 weeks (around 05/06/2019).  STOP Breo  START Flovent- take 2 puffs twice daily scheduled (may use an additional third time if needed)  Continue Albuterol rescue inhaler 2 puffs every 4-6 hours as needed for breakthrough shortness of breath/wheezing   Recommend Zyrtec 10mg  daily over the counter if having any allergy symptoms (nasal congestion, runny nose, sneezing, itchy eyes, dry throat, cough)  Follow up in 3-4 weeks with NP E-visit      Message routed to [place], NP to advise on OTC anxiety recommendations, Doxycycline, and SHOB

## 2019-05-03 NOTE — Telephone Encounter (Signed)
Is he taking any maintenance inhalers currently? I dont see any on his list and we've tried a few It is fine to take doxycycline. Nothing over the counter for anxiety, needs to discuss with PCP.

## 2019-05-03 NOTE — Telephone Encounter (Signed)
Called and spoke with pt letting him know the info stated by Buelah Manis. Asked pt if he was currently doing any daily inhalers and pt stated he was doing Breo 200. Pt stated that he was originally on Flovent but with the Flovent he began having tightness in his chest so he did go back to [place]. Pt stated Beth had increased him from Breo 100 to [place] 200 and he is taking that daily as prescribed.  I have updated pt's med list so the Breo 200 shows up on there. Pt asked if we wanted to know the results of the COVID test when he receives them as he had the COVID test performed last Wednesday by [facility]. I stated to pt to call us once the results have come back to let us know what they are.   Stated to pt in regards to the anxiety, there is nothing OTC that he could take and he needed to contact PCP in regards to this and he verbalized understanding. Routing to [place] as an FYI in regards to inhaler pt is taking daily.

## 2019-05-03 NOTE — Telephone Encounter (Signed)
Great, thank you. Nothing changes right now to therapy. Continue Breo 200 daily. Use rescue inhaler every 4-6 hours as needed for sob/chest tightness. Will follow-up during during visit on 5/15. Thanks

## 2019-05-06 ENCOUNTER — Other Ambulatory Visit: Payer: Self-pay

## 2019-05-06 ENCOUNTER — Encounter: Payer: Self-pay | Admitting: Primary Care

## 2019-05-06 ENCOUNTER — Ambulatory Visit (INDEPENDENT_AMBULATORY_CARE_PROVIDER_SITE_OTHER): Payer: Medicare Other | Admitting: Primary Care

## 2019-05-06 DIAGNOSIS — J449 Chronic obstructive pulmonary disease, unspecified: Secondary | ICD-10-CM | POA: Diagnosis not present

## 2019-05-06 MED ORDER — FLUTICASONE-UMECLIDIN-VILANT 100-62.5-25 MCG/INH IN AEPB: 1.0000 | INHALATION_SPRAY | Freq: Every day | RESPIRATORY_TRACT | 0 refills | Status: DC

## 2019-05-06 MED ORDER — MONTELUKAST SODIUM 10 MG PO TABS: 10.0000 mg | ORAL_TABLET | Freq: Every day | ORAL | 11 refills | Status: DC

## 2019-05-06 NOTE — Patient Instructions (Addendum)
  HOLD breo  Start Trelegy- take 1 puff daily (trial 2 weeks, sample given)  Start Singulair 10mg  at bedtime   Continue Zyrtec daily  Follow up in 2 weeks with NP (video or office visit if covid negative)

## 2019-05-06 NOTE — Progress Notes (Signed)
Virtual Visit via Telephone Note  I connected with Tim Thomas on 05/06/19 at  1:30 PM EDT by telephone and verified that I am speaking with the correct person using two identifiers.  Location: Patient: Home Provider: Office   I discussed the limitations, risks, security and privacy concerns of performing an evaluation and management service by telephone and the availability of in person appointments. I also discussed with the patient that there may be a patient responsible charge related to this service. The patient expressed understanding and agreed to proceed.   History of Present Illness: 68 year old male, former smoker quit March 2020 (Smoked 1-2 ppd since age 84). PMH significant for COPD with asthma. Patient of Dr. Craige Cotta, last seen on 12/06/18. Maintained on Breo, if needs to use more regularly recommended changing him to ICS without LABA. Called office on 3/23 with reports of chest tightness and sob x 3 weeks. Took some old doxycyline and started to feel somewhat better. Called PCP and was given additional Ceftin course. Patient went to ED on 03/20/19, d-dimer 0.75 and BNP 17. CTA negative for PE or PNA. Trop neg x 2. Shortness of breath did get better but returned on 03/24/19. Called office and was given additional course of oral steroids.   04/08/2019 Patient called office with complaints of shortness of breath x 6 weeks and occasional rib pain. Feels improved since prednisone course but not back to his baseline. States that he can take a breath but states he has to work at it. Some days he is fine. Some chest tightness and dry cough. After using his Breo inhaler he feels some relief but several hours later he notices he gets short of breath. Denies wheezing. Afebrile.   05/06/2019 Patient called for follow-up today. He was started on trial Flovent during last visit but that caused chest tightness. He wanted to go back on Breo so we increase dose to 200. States that he has good and bad days.  Reports increased shortness of breath, chest tightness and cough with clear mucus. He also reports red eyes. States that he hasn't need to use his rescue inhaler that much. Started on doxycycline from dermatologist. Since starting abx cough has cleared some. He is also feeling quite anxious and called to ask if there was anything he could take over the counter. He will contact PCP regarding this. He hasn't felt well since march. CTA on March 20th was negative for PE. Lungs were clear with no signs of edema, consolidation, effusion or pneumothorax. BNP has been normal. He had covid testing but not resulted. No history sleep apnea. Denies weight gain. Reports that he has lost weight since March. Has lots of lower GI gas. States that he is up to date with colonoscopy. No GERD symptoms.    Observations/Objective:  - No significant shortness of breath, wheezing or cough noted during phone conversation  Assessment and Plan:   COPD with asthma - Tried and failed Flovent - No improvement with BREO 200  - Significant smoking history. He has not tried LAMA - Samples for Trelegy given  - Add Singulair; continue zyrtec daily   Follow Up Instructions:  2 week follow-up with NP   I discussed the assessment and treatment plan with the patient. The patient was provided an opportunity to ask questions and all were answered. The patient agreed with the plan and demonstrated an understanding of the instructions.   The patient was advised to call back or seek an in-person evaluation if  the symptoms worsen or if the condition fails to improve as anticipated.  I provided 25 minutes of non-face-to-face time during this encounter.   Glenford BayleyElizabeth W Sunny Gains, NP

## 2019-05-09 ENCOUNTER — Telehealth: Payer: Self-pay | Admitting: Primary Care

## 2019-05-09 MED ORDER — FLUTICASONE-UMECLIDIN-VILANT 100-62.5-25 MCG/INH IN AEPB: 1.0000 | INHALATION_SPRAY | Freq: Every day | RESPIRATORY_TRACT | 0 refills | Status: DC

## 2019-05-09 NOTE — Telephone Encounter (Signed)
Spoke to pt.  Scheduled pt 2 week f/u 05/29 with BW and will leave sample to trelegy at front.  Covid test negative from 05/06.

## 2019-05-11 ENCOUNTER — Other Ambulatory Visit: Payer: Self-pay

## 2019-05-11 ENCOUNTER — Encounter (INDEPENDENT_AMBULATORY_CARE_PROVIDER_SITE_OTHER): Payer: Medicare Other | Admitting: Ophthalmology

## 2019-05-11 DIAGNOSIS — H43813 Vitreous degeneration, bilateral: Secondary | ICD-10-CM | POA: Diagnosis not present

## 2019-05-11 DIAGNOSIS — H2513 Age-related nuclear cataract, bilateral: Secondary | ICD-10-CM

## 2019-05-11 DIAGNOSIS — I10 Essential (primary) hypertension: Secondary | ICD-10-CM

## 2019-05-11 DIAGNOSIS — H35033 Hypertensive retinopathy, bilateral: Secondary | ICD-10-CM

## 2019-05-11 DIAGNOSIS — H353231 Exudative age-related macular degeneration, bilateral, with active choroidal neovascularization: Secondary | ICD-10-CM | POA: Diagnosis not present

## 2019-05-19 NOTE — Progress Notes (Signed)
  ID: Tim Thomas, male    DOB: 1951/01/28, 68 y.o.   MRN: [id number]  Chief Complaint  Patient presents with  . Follow-up    tightness in chest,fatigue easily, pain in back    Referring provider: Ralene Ok, MD  HPI: 68 year old male, former smoker quit March 2020 (Smoked 1-2 ppd since age 63). PMH significant for COPD with asthma. Patient of Dr. Craige Cotta, last seen on 12/06/18. Maintained on Breo, if needs to use more regularly recommended changing him to ICS without LABA. Called office on 3/23 with reports of chest tightness and sob x 3 weeks. Took some old doxycyline and started to feel somewhat better. Called PCP and was given additional Ceftin course. Patient went to ED on 03/20/19, d-dimer 0.75 and BNP 17. CTA negative for PE or PNA. Trop neg x 2. Shortness of breath did get better but returned on 03/24/19. Called office and was given additional course of oral steroids.   Previous [facility] encounters: 04/08/2019 Patient called office with complaints of shortness of breath x 6 weeks and occasional rib pain. Feels improved since prednisone course but not back to his baseline. States that he can take a breath but states he has to work at it. Some days he is fine. Some chest tightness and dry cough. After using his Breo inhaler he feels some relief but several hours later he notices he gets short of breath. Denies wheezing. Afebrile.   05/06/2019 Patient called for follow-up today. He was started on trial Flovent during last visit but that caused chest tightness. He wanted to go back on Breo so we increase dose to 200. States that he has good and bad days. Reports increased shortness of breath, chest tightness and cough with clear mucus. He also reports red eyes. States that he hasn't need to use his rescue inhaler that much. Started on doxycycline from dermatologist. Since starting abx cough has cleared some. He is also feeling quite anxious and called to ask if there was anything he could  take over the counter. He will contact PCP regarding this. He hasn't felt well since march. CTA on March 20th was negative for PE. Lungs were clear with no signs of edema, consolidation, effusion or pneumothorax. BNP has been normal. He had covid testing but not resulted. No history sleep apnea. Denies weight gain. Reports that he has lost weight since March. Has lots of lower GI gas. States that he is up to date with colonoscopy. No GERD symptoms.   05/20/2019 Patient presents today for 2 week follow-up. Covid screening negative. Feeling a little better. Occasional chest tightness and shortness of breath especially with mask on. Feels Trelegy has been more helpful than Breo. States that she is able to catch is breath better with Trelegy. He still does not feel back to normal. Always has cough described as clear mucus. He has response to prednisone in the past.   Testing reviewed: 05/20 Eos absolute- 200  03/21/19 CTA- negative for PE 03/21/19 BNP- 17 03/21/19- EKG SR   Allergies  Allergen Reactions  . Bee Venom Swelling    Facial and sit of sting swelling Has EPi-pen  . Ciprofloxacin Other (See Comments)    Neck stiffness and pains  . Ivp Dye [Iodinated Diagnostic Agents] Hives and Other (See Comments)    Pt is allergic to contrast material injected by an opthamologist, not contrast used by radiology///a.calhoun Benadryl works for prophylaxis    Immunization History  Administered Date(s) Administered  . Influenza  Split 09/22/2011, 09/24/2015, 09/30/2017  . Influenza Whole 10/22/2012  . Influenza, High Dose Seasonal PF 10/01/2018  . Influenza,inj,Quad PF,6+ Mos 09/21/2014  . Pneumococcal Polysaccharide-23 12/23/2007    Past Medical History:  Diagnosis Date  . Anxiety   . BPH (benign prostatic hyperplasia)   . COPD (chronic obstructive pulmonary disease) (HCC)   . 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 Foley catheter in place    02-17-18, occ clots in bag  . High cholesterol   .  Hypertension   . Prostate enlargement   . Shoulder pain    right chronic  . Urinary retention     august  2018  . Wet senile macular degeneration (HCC)     right eye current tx, left eye tx in past    Tobacco History: Social History   Tobacco Use  Smoking Status Former Smoker  . Packs/day: 1.50  . Years: 50.00  . Pack years: 75.00  . Last attempt to quit: 03/04/2019  . Years since quitting: 0.2  Smokeless Tobacco Never Used   Counseling given: Not Answered   Outpatient Medications Prior to Visit  Medication Sig Dispense Refill  . acetaminophen (TYLENOL) 500 MG tablet Take 500 mg by mouth every 6 (six) hours as needed for moderate pain.    Marland Kitchen albuterol (PROAIR HFA) 108 (90 Base) MCG/ACT inhaler Inhale 2 puffs into the lungs every 6 (six) hours as needed for wheezing or shortness of breath. 1 Inhaler 3  . amLODipine (NORVASC) 10 MG tablet Take 10 mg by mouth daily.     Marland Kitchen EPINEPHrine 0.3 mg/0.3 mL IJ SOAJ injection Inject 0.3 mLs (0.3 mg total) into the muscle once. (Patient taking differently: Inject 0.3 mg into the muscle as needed for anaphylaxis. ) 2 Device 1  . Fluticasone-Umeclidin-Vilant (TRELEGY ELLIPTA) 100-62.5-25 MCG/INH AEPB Inhale 1 puff into the lungs daily. 60 each 0  . montelukast (SINGULAIR) 10 MG tablet Take 1 tablet (10 mg total) by mouth at bedtime. 30 tablet 11  . rosuvastatin (CRESTOR) 20 MG tablet Take 20 mg by mouth daily.     Marland Kitchen doxycycline (VIBRAMYCIN) 100 MG capsule Take 1 capsule (100 mg total) by mouth 2 (two) times daily. 20 capsule 0  . Fluticasone-Umeclidin-Vilant (TRELEGY ELLIPTA) 100-62.5-25 MCG/INH AEPB Inhale 1 puff into the lungs daily. 60 each 0   No facility-administered medications prior to visit.     Review of Systems  Review of Systems  Constitutional: Negative.   Respiratory: Positive for cough and shortness of breath.   Cardiovascular: Negative.    Physical Exam  BP 124/78 (BP Location: Left Arm, Cuff Size: Normal)   Pulse 74    Temp 97.9 F (36.6 C)   Ht 5\' 11"  (1.803 m)   Wt 216 lb 12.8 oz (98.3 kg)   SpO2 98%   BMI 30.24 kg/m  Physical Exam Constitutional:      Appearance: Normal appearance.  HENT:     Head: Normocephalic and atraumatic.     Right Ear: Tympanic membrane normal.     Left Ear: Tympanic membrane normal.     Mouth/Throat:     Mouth: Mucous membranes are moist.     Pharynx: Oropharynx is clear.  Eyes:     Pupils: Pupils are equal, round, and reactive to light.  Cardiovascular:     Rate and Rhythm: Normal rate and regular rhythm.     Comments: No edema Pulmonary:     Effort: Pulmonary effort is normal.     Breath sounds:  Normal breath sounds. No wheezing.     Comments: CTA Musculoskeletal: Normal range of motion.  Skin:    General: Skin is warm and dry.  Neurological:     General: No focal deficit present.     Mental Status: He is alert and oriented to person, place, and time. Mental status is at baseline.  Psychiatric:        Mood and Affect: Mood normal.        Behavior: Behavior normal.        Thought Content: Thought content normal.        Judgment: Judgment normal.      Lab Results:  CBC    Component Value Date/Time   WBC 7.2 03/21/2019 0229   RBC 5.36 03/21/2019 0229   HGB 15.9 03/21/2019 0229   HCT 49.0 03/21/2019 0229   PLT 280 03/21/2019 0229   MCV 91.4 03/21/2019 0229   MCH 29.7 03/21/2019 0229   MCHC 32.4 03/21/2019 0229   RDW 13.9 03/21/2019 0229   LYMPHSABS 2.6 03/21/2019 0229   MONOABS 0.7 03/21/2019 0229   EOSABS 0.1 03/21/2019 0229   BASOSABS 0.1 03/21/2019 0229    BMET    Component Value Date/Time   NA 140 03/21/2019 0229   K 3.2 (L) 03/21/2019 0229   CL 109 03/21/2019 0229   CO2 19 (L) 03/21/2019 0229   GLUCOSE 87 03/21/2019 0229   BUN 24 (H) 03/21/2019 0229   CREATININE 1.12 03/21/2019 0229   CALCIUM 9.7 03/21/2019 0229   GFRNONAA >60 03/21/2019 0229   GFRAA >60 03/21/2019 0229    BNP    Component Value Date/Time   BNP 17.2  03/21/2019 0229    ProBNP No results found for: PROBNP  Imaging: Dg Chest 2 View  Result Date: 05/20/2019 CLINICAL DATA:  Shortness of breath EXAM: CHEST - 2 VIEW COMPARISON:  03/21/2019 FINDINGS: Heart and mediastinal contours are within normal limits. No focal opacities or effusions. No acute bony abnormality. IMPRESSION: No active cardiopulmonary disease. Electronically Signed   By: Charlett NoseKevin  Dover M.D.   On: 05/20/2019 12:36     Assessment & Plan:   COPD with asthma - Some improvement with additional of Trelegy - Continues to complain of dyspnea and cough  - Eos absolute 200  - Unable to do FENO in office d/t covid restrictions - Needs additional RX prednisone taper - CXR showed clear lungs today - Checkings labs (BNP, IgE) - Continue to work on increasing phasically activity and weight loss - FU in 3 months with full PFTs   Dyspnea - Needs echocardiogram re: dyspnea and fatigue  - CTA negative for PE in March 2020   Glenford BayleyElizabeth W Kaida Games, NP 05/20/2019

## 2019-05-20 ENCOUNTER — Other Ambulatory Visit: Payer: Self-pay

## 2019-05-20 ENCOUNTER — Ambulatory Visit (INDEPENDENT_AMBULATORY_CARE_PROVIDER_SITE_OTHER): Payer: Medicare Other

## 2019-05-20 ENCOUNTER — Ambulatory Visit (INDEPENDENT_AMBULATORY_CARE_PROVIDER_SITE_OTHER): Payer: Medicare Other | Admitting: Primary Care

## 2019-05-20 ENCOUNTER — Encounter: Payer: Self-pay | Admitting: Primary Care

## 2019-05-20 VITALS — BP 124/78 | HR 74 | Temp 97.9°F | Ht 71.0 in | Wt 216.8 lb

## 2019-05-20 DIAGNOSIS — R0602 Shortness of breath: Secondary | ICD-10-CM

## 2019-05-20 DIAGNOSIS — R0600 Dyspnea, unspecified: Secondary | ICD-10-CM

## 2019-05-20 DIAGNOSIS — J449 Chronic obstructive pulmonary disease, unspecified: Secondary | ICD-10-CM

## 2019-05-20 LAB — BRAIN NATRIURETIC PEPTIDE: Pro B Natriuretic peptide (BNP): 15 pg/mL (ref 0.0–100.0)

## 2019-05-20 MED ORDER — FLUTICASONE-UMECLIDIN-VILANT 100-62.5-25 MCG/INH IN AEPB: 1.0000 | INHALATION_SPRAY | Freq: Every day | RESPIRATORY_TRACT | 5 refills | Status: DC

## 2019-05-20 MED ORDER — PREDNISONE 10 MG PO TABS: ORAL_TABLET | ORAL | 0 refills | Status: DC

## 2019-05-20 NOTE — Patient Instructions (Addendum)
Continue Trelegy 1 puff daily  Continue Albuterol rescue inhaler 2 puff every 4-6 hours as needed for breakthrough shortness of breath/wheezing   Continue Singulair  at bedtime   Continue Zyrtec  daily   RX prednisone taper as prescribed for COPD exacerbation/asthma   Continue to work on increasing phasically activity and weight loss  Orders: CXR and labs today Echocardiogram re: dyspnea  PFTs  Follow-up: Dr. Craige Cotta in 3 months with full PFTs or sooner if needed     Calorie Counting for Weight Loss Calories are units of energy. Your body needs a certain amount of calories from food to keep you going throughout the day. When you eat more calories than your body needs, your body stores the extra calories as fat. When you eat fewer calories than your body needs, your body burns fat to get the energy it needs. Calorie counting means keeping track of how many calories you eat and drink each day. Calorie counting can be helpful if you need to lose weight. If you make sure to eat fewer calories than your body needs, you should lose weight. Ask your health care provider what a healthy weight is for you. For calorie counting to work, you will need to eat the right number of calories in a day in order to lose a healthy amount of weight per week. A dietitian can help you determine how many calories you need in a day and will give you suggestions on how to reach your calorie goal.  A healthy amount of weight to lose per week is usually 1-2 lb (0.5-0.9 kg). This usually means that your daily calorie intake should be reduced by 500-750 calories.  Eating 1,200 - 1,500 calories per day can help most women lose weight.  Eating 1,500 - 1,800 calories per day can help most men lose weight. What is my plan? My goal is to have __________ calories per day. If I have this many calories per day, I should lose around __________ pounds per week. What do I need to know about calorie counting? In order  to meet your daily calorie goal, you will need to:  Find out how many calories are in each food you would like to eat. Try to do this before you eat.  Decide how much of the food you plan to eat.  Write down what you ate and how many calories it had. Doing this is called keeping a food log. To successfully lose weight, it is important to balance calorie counting with a healthy lifestyle that includes regular activity. Aim for 150 minutes of moderate exercise (such as walking) or 75 minutes of vigorous exercise (such as running) each week. Where do I find calorie information?  The number of calories in a food can be found on a Nutrition Facts label. If a food does not have a Nutrition Facts label, try to look up the calories online or ask your dietitian for help. Remember that calories are listed per serving. If you choose to have more than one serving of a food, you will have to multiply the calories per serving by the amount of servings you plan to eat. For example, the label on a package of bread might say that a serving size is 1 slice and that there are 90 calories in a serving. If you eat 1 slice, you will have eaten 90 calories. If you eat 2 slices, you will have eaten 180 calories. How do I keep a food log? Immediately after  each meal, record the following information in your food log:  What you ate. Don't forget to include toppings, sauces, and other extras on the food.  How much you ate. This can be measured in cups, ounces, or number of items.  How many calories each food and drink had.  The total number of calories in the meal. Keep your food log near you, such as in a small notebook in your pocket, or use a mobile app or website. Some programs will calculate calories for you and show you how many calories you have left for the day to meet your goal. What are some calorie counting tips?   Use your calories on foods and drinks that will fill you up and not leave you  hungry: ? Some examples of foods that fill you up are nuts and nut butters, vegetables, lean proteins, and high-fiber foods like whole grains. High-fiber foods are foods with more than 5 g fiber per serving. ? Drinks such as sodas, specialty coffee drinks, alcohol, and juices have a lot of calories, yet do not fill you up.  Eat nutritious foods and avoid empty calories. Empty calories are calories you get from foods or beverages that do not have many vitamins or protein, such as candy, sweets, and soda. It is better to have a nutritious high-calorie food (such as an avocado) than a food with few nutrients (such as a bag of chips).  Know how many calories are in the foods you eat most often. This will help you calculate calorie counts faster.  Pay attention to calories in drinks. Low-calorie drinks include water and unsweetened drinks.  Pay attention to nutrition labels for "low fat" or "fat free" foods. These foods sometimes have the same amount of calories or more calories than the full fat versions. They also often have added sugar, starch, or salt, to make up for flavor that was removed with the fat.  Find a way of tracking calories that works for you. Get creative. Try different apps or programs if writing down calories does not work for you. What are some portion control tips?  Know how many calories are in a serving. This will help you know how many servings of a certain food you can have.  Use a measuring cup to measure serving sizes. You could also try weighing out portions on a kitchen scale. With time, you will be able to estimate serving sizes for some foods.  Take some time to put servings of different foods on your favorite plates, bowls, and cups so you know what a serving looks like.  Try not to eat straight from a bag or box. Doing this can lead to overeating. Put the amount you would like to eat in a cup or on a plate to make sure you are eating the right portion.  Use smaller  plates, glasses, and bowls to prevent overeating.  Try not to multitask (for example, watch TV or use your computer) while eating. If it is time to eat, sit down at a table and enjoy your food. This will help you to know when you are full. It will also help you to be aware of what you are eating and how much you are eating. What are tips for following this plan? Reading food labels  Check the calorie count compared to the serving size. The serving size may be smaller than what you are used to eating.  Check the source of the calories. Make sure the food  you are eating is high in vitamins and protein and low in saturated and trans fats. Shopping  Read nutrition labels while you shop. This will help you make healthy decisions before you decide to purchase your food.  Make a grocery list and stick to it. Cooking  Try to cook your favorite foods in a healthier way. For example, try baking instead of frying.  Use low-fat dairy products. Meal planning  Use more fruits and vegetables. Half of your plate should be fruits and vegetables.  Include lean proteins like poultry and fish. How do I count calories when eating out?  Ask for smaller portion sizes.  Consider sharing an entree and sides instead of getting your own entree.  If you get your own entree, eat only half. Ask for a box at the beginning of your meal and put the rest of your entree in it so you are not tempted to eat it.  If calories are listed on the menu, choose the lower calorie options.  Choose dishes that include vegetables, fruits, whole grains, low-fat dairy products, and lean protein.  Choose items that are boiled, broiled, grilled, or steamed. Stay away from items that are buttered, battered, fried, or served with cream sauce. Items labeled "crispy" are usually fried, unless stated otherwise.  Choose water, low-fat milk, unsweetened iced tea, or other drinks without added sugar. If you want an alcoholic beverage,  choose a lower calorie option such as a glass of wine or light beer.  Ask for dressings, sauces, and syrups on the side. These are usually high in calories, so you should limit the amount you eat.  If you want a salad, choose a garden salad and ask for grilled meats. Avoid extra toppings like bacon, cheese, or fried items. Ask for the dressing on the side, or ask for olive oil and vinegar or lemon to use as dressing.  Estimate how many servings of a food you are given. For example, a serving of cooked rice is  cup or about the size of half a baseball. Knowing serving sizes will help you be aware of how much food you are eating at restaurants. The list below tells you how big or small some common portion sizes are based on everyday objects: ? 1 oz-4 stacked dice. ? 3 oz-1 deck of cards. ? 1 tsp-1 die. ? 1 Tbsp- a ping-pong ball. ? 2 Tbsp-1 ping-pong ball. ?  cup- baseball. ? 1 cup-1 baseball. Summary  Calorie counting means keeping track of how many calories you eat and drink each day. If you eat fewer calories than your body needs, you should lose weight.  A healthy amount of weight to lose per week is usually 1-2 lb (0.5-0.9 kg). This usually means reducing your daily calorie intake by 500-750 calories.  The number of calories in a food can be found on a Nutrition Facts label. If a food does not have a Nutrition Facts label, try to look up the calories online or ask your dietitian for help.  Use your calories on foods and drinks that will fill you up, and not on foods and drinks that will leave you hungry.  Use smaller plates, glasses, and bowls to prevent overeating. This information is not intended to replace advice given to you by your health care provider. Make sure you discuss any questions you have with your health care provider. Document Released: 12/08/2005 Document Revised: 08/27/2018 Document Reviewed: 11/07/2016 Elsevier Interactive Patient Education  2019 [employer].  COPD and Physical Activity Chronic obstructive pulmonary disease (COPD) is a long-term (chronic) condition that affects the lungs. COPD is a general term that can be used to describe many different lung problems that cause lung swelling (inflammation) and limit airflow, including chronic bronchitis and emphysema. The main symptom of COPD is shortness of breath, which makes it harder to do even simple tasks. This can also make it harder to exercise and be active. Talk with your health care provider about treatments to help you breathe better and actions you can take to prevent breathing problems during physical activity. What are the benefits of exercising with COPD? Exercising regularly is an important part of a healthy lifestyle. You can still exercise and do physical activities even though you have COPD. Exercise and physical activity improve your shortness of breath by increasing blood flow (circulation). This causes your heart to pump more oxygen through your body. Moderate exercise can improve your:  Oxygen use.  Energy level.  Shortness of breath.  Strength in your breathing muscles.  Heart health.  Sleep.  Self-esteem and feelings of self-worth.  Depression, stress, and anxiety levels. Exercise can benefit everyone with COPD. The severity of your disease may affect how hard you can exercise, especially at first, but everyone can benefit. Talk with your health care provider about how much exercise is safe for you, and which activities and exercises are safe for you. What actions can I take to prevent breathing problems during physical activity?  Sign up for a pulmonary rehabilitation program. This type of program may include: ? Education about lung diseases. ? Exercise classes that teach you how to exercise and be more active while improving your breathing. This usually involves:  Exercise using your lower extremities, such as a stationary bicycle.  About 30 minutes of  exercise, 2 to 5 times per week, for 6 to 12 weeks  Strength training, such as push ups or leg lifts. ? Nutrition education. ? Group classes in which you can talk with others who also have COPD and learn ways to manage stress.  If you use an oxygen tank, you should use it while you exercise. Work with your health care provider to adjust your oxygen for your physical activity. Your resting flow rate is different from your flow rate during physical activity.  While you are exercising: ? Take slow breaths. ? Pace yourself and do not try to go too fast. ? Purse your lips while breathing out. Pursing your lips is similar to a kissing or whistling position. ? If doing exercise that uses a quick burst of effort, such as weight lifting:  Breathe in before starting the exercise.  Breathe out during the hardest part of the exercise (such as raising the weights). Where to find support You can find support for exercising with COPD from:  Your health care provider.  A pulmonary rehabilitation program.  Your local health department or community health programs.  Support groups, online or in-person. Your health care provider may be able to recommend support groups. Where to find more information You can find more information about exercising with COPD from:  American Lung Association: [place].hulung.org.  COPD Foundation: [employer].glcopdfoundation.org. Contact a health care provider if:  Your symptoms get worse.  You have chest pain.  You have nausea.  You have a fever.  You have trouble talking or catching your breath.  You want to start a new exercise program or a new activity. Summary  COPD is a general term that can  be used to describe many different lung problems that cause lung swelling (inflammation) and limit airflow. This includes chronic bronchitis and emphysema.  Exercise and physical activity improve your shortness of breath by increasing blood flow (circulation). This causes your heart to  provide more oxygen to your body.  Contact your health care provider before starting any exercise program or new activity. Ask your health care provider what exercises and activities are safe for you. This information is not intended to replace advice given to you by your health care provider. Make sure you discuss any questions you have with your health care provider. Document Released: 12/31/2017 Document Revised: 12/31/2017 Document Reviewed: 12/31/2017 Elsevier Interactive Patient Education  2019 [employer].

## 2019-05-20 NOTE — Assessment & Plan Note (Addendum)
-   Needs echocardiogram re: dyspnea and fatigue  - CTA negative for PE in March 2020

## 2019-05-20 NOTE — Assessment & Plan Note (Addendum)
-   Some improvement with additional of Trelegy - Continues to complain of dyspnea and cough  - Eos absolute 200  - Unable to do FENO in office d/t covid restrictions - Needs additional RX prednisone taper - CXR showed clear lungs today - Checkings labs (BNP, IgE) - Continue to work on increasing phasically activity and weight loss - FU in 3 months with full PFTs

## 2019-05-23 ENCOUNTER — Telehealth (HOSPITAL_COMMUNITY): Payer: Self-pay

## 2019-05-23 LAB — IGE: IgE (Immunoglobulin E), Serum: 307 kU/L — ABNORMAL HIGH (ref <=114)

## 2019-05-23 NOTE — Telephone Encounter (Signed)

## 2019-05-24 ENCOUNTER — Ambulatory Visit (HOSPITAL_COMMUNITY): Payer: Medicare Other | Attending: Cardiology

## 2019-05-24 ENCOUNTER — Other Ambulatory Visit: Payer: Self-pay

## 2019-05-24 DIAGNOSIS — R0602 Shortness of breath: Secondary | ICD-10-CM | POA: Insufficient documentation

## 2019-05-25 ENCOUNTER — Telehealth: Payer: Self-pay | Admitting: Primary Care

## 2019-05-25 NOTE — Telephone Encounter (Signed)
Notes recorded by Earvin Hansen, RN on 05/25/2019 at 1:21 PM EDT Left messgae with woman to call back. ------  Notes recorded by Glenford Bayley, NP on 05/25/2019 at 11:47 AM EDT Please let patient know echocardiogram was unremarkable. Nothing further needed at this time, follow up as usual.  Called and spoke with pt letting him know the results of the echo and pt verbalized understanding. Nothing further needed.

## 2019-07-05 ENCOUNTER — Telehealth: Payer: Self-pay | Admitting: Pulmonary Disease

## 2019-07-05 MED ORDER — PREDNISONE 10 MG PO TABS: ORAL_TABLET | ORAL | 0 refills | Status: DC

## 2019-07-05 NOTE — Telephone Encounter (Signed)
Primary Pulmonologist: [facility] Last office visit and with whom: 05/20/2019 with Derl Barrow What do we see them for (pulmonary problems): COPD w/ asthma Last OV assessment/plan: Instructions  Continue Trelegy 1 puff daily  Continue Albuterol rescue inhaler 2 puff every 4-6 hours as needed for breakthrough shortness of breath/wheezing   Continue Singulair 10mg  at bedtime   Continue Zyrtec 10mg  daily   RX prednisone taper as prescribed for COPD exacerbation/asthma   Continue to work on increasing phasically activity and weight loss  Orders: CXR and labs today Echocardiogram re: dyspnea  PFTs  Follow-up: Dr. Halford Chessman in 3 months with full PFTs or sooner if needed       Was appointment offered to patient (explain)?  Pt wants recommendations   Reason for call: called and spoke with pt who stated he has been having tightness in chest x4 days. Pt said he was out cutting grass and then the tightness began. Pt said when he inhales, he feels like he is unable to take a good breath. Pt said his O2 sats have been good ranging from 94-98%.  Pt has used his rescue inhaler once daily and pt stated yesterday 7/13 was the first time he used it. Pt is still taking trelegy and singulair as prescribed. Pt also states he takes zyrtec daily.  Pt denies any complaints of fever as his temp was 97.5, no real complaints of SOB just that he feels like he cant get a good breath and has the tightness.  Pt denies any complaints of nausea/vomiting, no cough. Pt stated that he was tested for COVID and the test came back negative and when he had last OV with PCP, they did antibody test for COVID and that also came back negative.  Pt is wanting recommendations to help with his symptoms. Beth, please advise on this for pt. Thanks!

## 2019-07-05 NOTE — Telephone Encounter (Signed)
Called and spoke with pt letting him know that [facility] sent pred Rx to pharmacy for him and also stated to him that we needed to get him scheduled for televisit to reassess in 5-10 days. Pt verbalized understanding. Pt has been scheduled for televisit with Beth 7/22 at 2pm. Nothing further needed.

## 2019-07-05 NOTE — Telephone Encounter (Signed)
Will send in prednisone taper, please scheduled televisit with me in 5-10 days with me.

## 2019-07-06 ENCOUNTER — Other Ambulatory Visit: Payer: Self-pay

## 2019-07-06 ENCOUNTER — Encounter (INDEPENDENT_AMBULATORY_CARE_PROVIDER_SITE_OTHER): Payer: Medicare Other | Admitting: Ophthalmology

## 2019-07-06 DIAGNOSIS — H35033 Hypertensive retinopathy, bilateral: Secondary | ICD-10-CM | POA: Diagnosis not present

## 2019-07-06 DIAGNOSIS — H43813 Vitreous degeneration, bilateral: Secondary | ICD-10-CM | POA: Diagnosis not present

## 2019-07-06 DIAGNOSIS — H353231 Exudative age-related macular degeneration, bilateral, with active choroidal neovascularization: Secondary | ICD-10-CM

## 2019-07-06 DIAGNOSIS — I10 Essential (primary) hypertension: Secondary | ICD-10-CM | POA: Diagnosis not present

## 2019-07-06 DIAGNOSIS — H2513 Age-related nuclear cataract, bilateral: Secondary | ICD-10-CM

## 2019-07-13 ENCOUNTER — Other Ambulatory Visit: Payer: Self-pay

## 2019-07-13 ENCOUNTER — Ambulatory Visit (INDEPENDENT_AMBULATORY_CARE_PROVIDER_SITE_OTHER): Payer: Medicare Other | Admitting: Primary Care

## 2019-07-13 ENCOUNTER — Encounter: Payer: Self-pay | Admitting: Primary Care

## 2019-07-13 DIAGNOSIS — J449 Chronic obstructive pulmonary disease, unspecified: Secondary | ICD-10-CM | POA: Diagnosis not present

## 2019-07-13 NOTE — Patient Instructions (Signed)
Continue Trelegy 1 puffs daily Use Albuterol 2 puffs every 4-6 hours for breakthrough shortness of breath/wheezing Continue Zyrtec and Singulair daily   Follow up in 3 months with full PFTs prior

## 2019-07-13 NOTE — Progress Notes (Signed)
Virtual Visit via Telephone Note  I connected with Tim Thomas on 07/13/19 at  2:00 PM EDT by telephone and verified that I am speaking with the correct person using two identifiers.  Location: Patient: Home Provider: Office    I discussed the limitations, risks, security and privacy concerns of performing an evaluation and management service by telephone and the availability of in person appointments. I also discussed with the patient that there may be a patient responsible charge related to this service. The patient expressed understanding and agreed to proceed.   History of Present Illness: 69 year old male, former smoker quit March 2020 (Smoked 1-2 ppd since age 44). PMH significant for COPD with asthma. Patient of Dr. Halford Chessman, last seen on 12/06/18. Maintained on Breo, if needs to use more regularly recommended changing him to ICS without LABA. Tried changing to [employer] but patient reported chest tightness. Breo resumed and later changed to Trelegy on 05/06/19.   Previous [facility] pulmonary encounters: 05/20/2019 Patient presents today for 2 week follow-up. Covid screening negative. Feeling a little better. Occasional chest tightness and shortness of breath especially with mask on. Feels Trelegy has been more helpful than Breo. States that she is able to catch is breath better with Trelegy. He still does not feel back to normal. Always has cough described as clear mucus. He has response to prednisone in the past.   07/05/19 Sick call, reported increased chest tightness x 4 days after cutting the grass. Sent in prednisone taper and advised follow-up in 5-10 days  07/13/2019 Patient contacted today for 1 week follow-up COPD/asthma exacerbation. Overall he is doing a lot better. Complains of chest tightness of exertion. Relaxing helps him take a deep breath. Taking Zyrtec and Singulair daily. Using Trelegy once daily. He hasn't required his rescue inhaler since starting prednisone taper. He has  been prescribed oral prednisone course over 6 times since the beginning of the year.    Testing reviewed: 05/20 Eos absolute- 200  03/21/19 CTA- negative for PE 03/21/19 BNP- 17 03/21/19- EKG SR  03/21/19- Eos absolute 100 05/20/19- IgE 307    Observations/Objective:  - No shortness of breath, wheezing or cough noted   Assessment and Plan:  COPD with asthma - Exacerbation improved after completing prednisone taper - Frequent use of oral steroids (6+ in 2020) - IgE 307, Eos absolute 100, BNP normal - Consider Xolair 300mg  q2 weeks - Needs full PFTs at next visit   Follow Up Instructions:   FU in 3 months with Dr. Halford Chessman   I discussed the assessment and treatment plan with the patient. The patient was provided an opportunity to ask questions and all were answered. The patient agreed with the plan and demonstrated an understanding of the instructions.   The patient was advised to call back or seek an in-person evaluation if the symptoms worsen or if the condition fails to improve as anticipated.  I provided 15 minutes of non-face-to-face time during this encounter.   Martyn Ehrich, NP

## 2019-07-14 ENCOUNTER — Telehealth: Payer: Self-pay | Admitting: Primary Care

## 2019-07-14 NOTE — Telephone Encounter (Signed)
-----   Message from Chesley Mires, MD sent at 07/13/2019  5:14 PM EDT ----- Regarding: RE: copd/asthma with freq steriod use If he is doing better with trelegy, singulair and antihistamine combination, then just continue to watch for now.  If he keeps coming in with exacerbations, then will need to add biologic agent.  [place] ----- Message ----- From: Martyn Ehrich, NP Sent: 07/13/2019   2:21 PM EDT To: Chesley Mires, MD Subject: copd/asthma with freq steriod use              Hi Dr. Halford Chessman, Patient of your with dx COPD and possible asthma overlap. He has been treated with prednisone course 6 times or more since January for sob/wheezing. His IgE is 307. Eos 100. I had tried changing him from breo to flovent but he complained of increased chest tightness. He is currently on trelegy which is feels has been the most effective.    Would you recommend xolair for this patient or continue to monitor? No recent Pft only spirometry   -beth

## 2019-07-14 NOTE — Telephone Encounter (Signed)
Called and spoke with Patient. Eustaquio Maize, NP and Dr Halford Chessman recommendations given.  Understanding stated.  Nothing further at this time.

## 2019-07-14 NOTE — Telephone Encounter (Signed)
Please let patient know I Spoke with Dr. Halford Chessman. He said if he is doing better with trelegy, Singulair and antihistamine combination to continue to watch for now. If patient consistently needing more oral prednisone, recommend starting paperwork for Xolair.

## 2019-07-25 NOTE — Progress Notes (Signed)
Reviewed and agree with assessment/plan.   Ottie Neglia, MD [facility] 12/17/2016, 12:24 PM Pager:  [phone]  

## 2019-08-02 NOTE — Progress Notes (Signed)
Reviewed and agree with assessment/plan.   Yareth Kearse, MD [facility] 12/17/2016, 12:24 PM Pager:  [phone]  

## 2019-08-13 NOTE — Progress Notes (Signed)
Reviewed and agree with assessment/plan.   Dezmon Conover, MD [facility] 12/17/2016, 12:24 PM Pager:  [phone]  

## 2019-08-31 ENCOUNTER — Encounter (INDEPENDENT_AMBULATORY_CARE_PROVIDER_SITE_OTHER): Payer: Medicare Other | Admitting: Ophthalmology

## 2019-09-06 ENCOUNTER — Other Ambulatory Visit: Payer: Self-pay

## 2019-09-06 ENCOUNTER — Encounter (INDEPENDENT_AMBULATORY_CARE_PROVIDER_SITE_OTHER): Payer: Medicare Other | Admitting: Ophthalmology

## 2019-09-06 DIAGNOSIS — H35033 Hypertensive retinopathy, bilateral: Secondary | ICD-10-CM

## 2019-09-06 DIAGNOSIS — H353231 Exudative age-related macular degeneration, bilateral, with active choroidal neovascularization: Secondary | ICD-10-CM

## 2019-09-06 DIAGNOSIS — I10 Essential (primary) hypertension: Secondary | ICD-10-CM | POA: Diagnosis not present

## 2019-09-06 DIAGNOSIS — H43813 Vitreous degeneration, bilateral: Secondary | ICD-10-CM | POA: Diagnosis not present

## 2019-09-06 DIAGNOSIS — H2513 Age-related nuclear cataract, bilateral: Secondary | ICD-10-CM

## 2019-09-27 ENCOUNTER — Telehealth: Payer: Self-pay | Admitting: Pulmonary Disease

## 2019-09-27 MED ORDER — ALBUTEROL SULFATE HFA 108 (90 BASE) MCG/ACT IN AERS: 2.0000 | INHALATION_SPRAY | Freq: Four times a day (QID) | RESPIRATORY_TRACT | 5 refills | Status: DC | PRN

## 2019-09-27 MED ORDER — ALBUTEROL SULFATE HFA 108 (90 BASE) MCG/ACT IN AERS: 2.0000 | INHALATION_SPRAY | Freq: Four times a day (QID) | RESPIRATORY_TRACT | 2 refills | Status: DC | PRN

## 2019-09-27 MED ORDER — PREDNISONE 10 MG PO TABS: ORAL_TABLET | ORAL | 0 refills | Status: DC

## 2019-09-27 NOTE — Telephone Encounter (Signed)
Spoke with the pt and notified of recs per Eustaquio Maize  He verbalized understanding  Has ov 11/02/19 with Beth with PFT's  Will keep appt and will call for f/u sooner if not improving

## 2019-09-27 NOTE — Telephone Encounter (Signed)
Continue Trelegy Inhaler as prescribed, use Albuterol 2 puff every 6 hours. Will send in short course of prednisone. Need to discuss potentially starting Xolair injections. Does he have an up coming apt with Dr. Halford Chessman or myslef?

## 2019-09-27 NOTE — Telephone Encounter (Signed)
Spoke with pt. He is needing a refill on Albuterol HFA. This has been sent in. While speaking to him he stated that he has been having more shortness of breath lately. Denies chest tightness, wheezing, fever, cough, sick contacts or recent travel. States that he got his PNA shot a week and half ago and these symptoms started after that. His shortness of breath is worse when he lays flat on his back. He would like Beth's recommendations (he asked for her by name).  Beth - please advise. Thanks.

## 2019-09-27 NOTE — Telephone Encounter (Signed)
LMTCB

## 2019-10-31 ENCOUNTER — Other Ambulatory Visit (HOSPITAL_COMMUNITY)
Admission: RE | Admit: 2019-10-31 | Discharge: 2019-10-31 | Disposition: A | Discharge status: Home / Self Care | Payer: Medicare Other | Source: Ambulatory Visit | Attending: Primary Care | Admitting: Primary Care

## 2019-10-31 DIAGNOSIS — Z01812 Encounter for preprocedural laboratory examination: Secondary | ICD-10-CM | POA: Diagnosis present

## 2019-10-31 DIAGNOSIS — Z20828 Contact with and (suspected) exposure to other viral communicable diseases: Secondary | ICD-10-CM | POA: Diagnosis not present

## 2019-10-31 LAB — SARS CORONAVIRUS 2 (TAT 6-24 HRS): SARS Coronavirus 2: NEGATIVE

## 2019-11-02 ENCOUNTER — Ambulatory Visit (INDEPENDENT_AMBULATORY_CARE_PROVIDER_SITE_OTHER): Payer: Medicare Other | Admitting: Pulmonary Disease

## 2019-11-02 ENCOUNTER — Ambulatory Visit (INDEPENDENT_AMBULATORY_CARE_PROVIDER_SITE_OTHER): Payer: Medicare Other | Admitting: Primary Care

## 2019-11-02 ENCOUNTER — Encounter: Payer: Self-pay | Admitting: Primary Care

## 2019-11-02 ENCOUNTER — Other Ambulatory Visit: Payer: Self-pay

## 2019-11-02 VITALS — BP 124/70 | HR 70 | Temp 97.7°F | Ht 71.0 in | Wt 230.0 lb

## 2019-11-02 DIAGNOSIS — Z720 Tobacco use: Secondary | ICD-10-CM | POA: Diagnosis not present

## 2019-11-02 DIAGNOSIS — R0602 Shortness of breath: Secondary | ICD-10-CM

## 2019-11-02 DIAGNOSIS — J449 Chronic obstructive pulmonary disease, unspecified: Secondary | ICD-10-CM | POA: Diagnosis not present

## 2019-11-02 LAB — PULMONARY FUNCTION TEST
DL/VA % pred: 101 %
DL/VA: 4.12 ml/min/mmHg/L
DLCO unc % pred: 120 %
DLCO unc: 32.33 ml/min/mmHg
FEF 25-75 Post: 2.81 L/sec
FEF 25-75 Pre: 1.83 L/sec
FEF2575-%Change-Post: 53 %
FEF2575-%Pred-Post: 106 %
FEF2575-%Pred-Pre: 69 %
FEV1-%Change-Post: 9 %
FEV1-%Pred-Post: 119 %
FEV1-%Pred-Pre: 109 %
FEV1-Post: 3.64 L
FEV1-Pre: 3.33 L
FEV1FVC-%Change-Post: 8 %
FEV1FVC-%Pred-Pre: 89 %
FEV6-%Change-Post: 4 %
FEV6-%Pred-Post: 126 %
FEV6-%Pred-Pre: 121 %
FEV6-Post: 4.83 L
FEV6-Pre: 4.64 L
FEV6FVC-%Change-Post: 3 %
FEV6FVC-%Pred-Post: 103 %
FEV6FVC-%Pred-Pre: 100 %
FVC-%Change-Post: 1 %
FVC-%Pred-Post: 121 %
FVC-%Pred-Pre: 120 %
FVC-Post: 4.88 L
FVC-Pre: 4.84 L
Post FEV1/FVC ratio: 75 %
Post FEV6/FVC ratio: 99 %
Pre FEV1/FVC ratio: 69 %
Pre FEV6/FVC Ratio: 96 %
RV % pred: 128 %
RV: 3.2 L
TLC % pred: 115 %
TLC: 8.31 L

## 2019-11-02 NOTE — Patient Instructions (Addendum)
  Recommendations: Continue Trelegy 1 puff daily Continue albuterol rescue inhaler as needed every 4-6 hours for shortness of breath/ wheezing or chest tightness Continue Singulair and Zyrtec   Orders: Refer to lung cancer screening clinic  Follow-up: 6 months with Dr. Halford Chessman or sooner if needed

## 2019-11-02 NOTE — Progress Notes (Signed)
PFT done today. 

## 2019-11-02 NOTE — Progress Notes (Signed)
@Patient  ID: , male    DOB: 1951/01/18, 68 y.o.   MRN: [id number]  Chief Complaint  Patient presents with   Follow-up    Referring provider: [id number], MD  HPI: 68 year old male, former smoker quit March 2020 (Smoked 1-2 ppd since age 61). PMH significant for COPD with asthma. Patient of Dr. 12, last seen on 12/06/18. Maintained on Breo, if needs to use more regularly recommended changing him to ICS without LABA. Tried changing to 12/08/18 but patient reported chest tightness. Breo resumed and later changed to Trelegy on 05/06/19.   Previous [place] pulmonary encounters: 05/20/2019 Patient presents today for 2 week follow-up. Covid screening negative. Feeling a little better. Occasional chest tightness and shortness of breath especially with mask on. Feels Trelegy has been more helpful than Breo. States that she is able to catch is breath better with Trelegy. He still does not feel back to normal. Always has cough described as clear mucus. He has response to prednisone in the past.   07/05/19 Sick call, reported increased chest tightness x 4 days after cutting the grass. Sent in prednisone taper and advised follow-up in 5-10 days  07/13/2019 Patient contacted today for 1 week follow-up COPD/asthma exacerbation. Overall he is doing a lot better. Complains of chest tightness of exertion. Relaxing helps him take a deep breath. Taking Zyrtec and Singulair daily. Using Trelegy once daily. He hasn't required his rescue inhaler since starting prednisone taper. He has been prescribed oral prednisone course over 6 times since the beginning of the year.   11/02/2019 Patient presents today for 3-4 month follow-up visit with PFTs. He has history of frequent COPD/asthma exacerbations requiring oral steroids six times this year. He has an elevated IgE. Since July his symptoms have been better controlled on Trelegy, Singulair and antihistamine. He did required prednisone once in October  after receiving influenza vaccine. Feels improvement with Trelegy. Still has some chest congestion and some chest tightness. No active wheezing, states that she used to wheeze when he smoked. He quit smoking in March. He has had allergy shots in the past. His symptoms improve on prednisone. Reports mold in crawl space of his home, no black mold according to analysis.  PFTs 11/02/19- FVC 4.88 (121%), FEV1 3.64 (119%), ratio 75  Curvature on flow volume loop, Normal DLCO   Testing reviewed: 05/20 Eos absolute- 200  03/21/19 CTA- negative for PE 03/21/19 BNP- 17 03/21/19- EKG SR  03/21/19- Eos absolute 100 05/20/19- IgE 307  Allergies  Allergen Reactions   Bee Venom Swelling    Facial and sit of sting swelling Has EPi-pen   Ciprofloxacin Other (See Comments)    Neck stiffness and pains   Ivp Dye [Iodinated Diagnostic Agents] Hives and Other (See Comments)    Pt is allergic to contrast material injected by an opthamologist, not contrast used by radiology///a.calhoun Benadryl works for prophylaxis    Immunization History  Administered Date(s) Administered   Influenza Split 09/22/2011, 09/24/2015, 09/30/2017   Influenza Whole 10/22/2012   Influenza, High Dose Seasonal PF 10/01/2018   Influenza,inj,Quad PF,6+ Mos 09/21/2014   Pneumococcal Polysaccharide-23 12/23/2007    Past Medical History:  Diagnosis Date   Anxiety    BPH (benign prostatic hyperplasia)    COPD (chronic obstructive pulmonary disease) (HCC)    COPD with asthma (HCC) 04/28/2012   Foley catheter in place    02-17-18, occ clots in bag   High cholesterol    Hypertension    Prostate enlargement  Shoulder pain    right chronic   Urinary retention     august  2018   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     albuterol (PROAIR HFA) 108 (90 Base) MCG/ACT inhaler Inhale 2 puffs into the lungs every 6 (six) hours as needed for wheezing or shortness of breath. 18 g 2   amLODipine (NORVASC) 10 MG tablet Take 10 mg by mouth daily.      cetirizine (ZYRTEC) 10 MG tablet Take 10 mg by mouth daily. As needed     EPINEPHrine 0.3 mg/0.3 mL IJ SOAJ injection Inject 0.3 mLs (0.3 mg total) into the muscle once. (Patient taking differently: Inject 0.3 mg into the muscle as needed for anaphylaxis. ) 2 Device 1   Fluticasone-Umeclidin-Vilant (TRELEGY ELLIPTA) 100-62.5-25 MCG/INH AEPB Inhale 1 puff into the lungs daily. 60 each 5   montelukast (SINGULAIR) 10 MG tablet Take 1 tablet (10 mg total) by mouth at bedtime. 30 tablet 11   predniSONE (DELTASONE) 10 MG tablet Take 2 tabs x 5 days 10 tablet 0   rosuvastatin (CRESTOR) 20 MG tablet Take 20 mg by mouth daily.      No facility-administered medications prior to visit.    Review of Systems  Review of Systems  Constitutional: Negative.   Respiratory: Positive for cough, chest tightness and shortness of breath. Negative for wheezing.    Physical Exam  BP 124/70 (BP Location: Left Arm, Cuff Size: Normal)    Pulse 70    Temp 97.7 F (36.5 C) (Oral)    Ht 5\' 11"  (1.803 m)    Wt 230 lb (104.3 kg)    SpO2 98%    BMI 32.08 kg/m  Physical Exam Constitutional:      Appearance: He is well-developed.  HENT:     Head: Normocephalic and atraumatic.  Eyes:     Pupils: Pupils are equal, round, and reactive to light.  Neck:     Musculoskeletal: Normal range of motion and neck supple.  Cardiovascular:     Rate and Rhythm: Normal rate and regular rhythm.     Heart  sounds: Normal heart sounds.  Pulmonary:     Effort: Pulmonary effort is normal. No respiratory distress.     Breath sounds: Normal breath sounds. No wheezing.  Musculoskeletal: Normal range of motion.  Skin:    General: Skin is warm and dry.     Findings: No erythema or rash.  Neurological:     General: No focal deficit present.     Mental Status: He is alert and oriented to person, place, and time. Mental status is at baseline.  Psychiatric:        Mood and Affect: Mood normal.        Behavior: Behavior normal.        Thought Content: Thought content normal.        Judgment: Judgment normal.      Lab Results:  CBC    Component Value Date/Time   WBC 7.2 03/21/2019 0229   RBC 5.36 03/21/2019 0229   HGB 15.9 03/21/2019 0229   HCT 49.0 03/21/2019 0229   PLT 280 03/21/2019 0229   MCV 91.4 03/21/2019 0229   MCH 29.7 03/21/2019 0229   MCHC 32.4 03/21/2019 0229   RDW 13.9 03/21/2019 0229   LYMPHSABS 2.6 03/21/2019 0229   MONOABS 0.7 03/21/2019 0229   EOSABS 0.1 03/21/2019 0229   BASOSABS 0.1 03/21/2019 0229    BMET    Component Value Date/Time   NA 140 03/21/2019 0229   K 3.2 (L) 03/21/2019 0229   CL 109 03/21/2019 0229   CO2 19 (L) 03/21/2019 0229   GLUCOSE 87 03/21/2019 0229   BUN 24 (H) 03/21/2019 0229   CREATININE 1.12 03/21/2019 0229   CALCIUM 9.7 03/21/2019 0229   GFRNONAA >60 03/21/2019 0229   GFRAA >60 03/21/2019 0229    BNP    Component Value Date/Time   BNP 17.2 03/21/2019 0229    ProBNP    Component Value Date/Time   PROBNP 15.0 05/20/2019 1137    Imaging: No results found.   Assessment & Plan:   COPD with asthma - Improvement in breathing and reduction in exacerbations  - Continue Trelegy 1 puff daily - Continue Singulair 10mg  daily - Continue Zyrtec 10mg  daily - Monitor symptoms, if consistently needing more oral steroids recommend adding Xolair    Glenford BayleyElizabeth W Nolie Bignell, NP 11/04/2019

## 2019-11-04 ENCOUNTER — Encounter: Payer: Self-pay | Admitting: Primary Care

## 2019-11-04 NOTE — Progress Notes (Signed)
Reviewed and agree with assessment/plan.   Davinia Riccardi, MD [facility] 12/17/2016, 12:24 PM Pager:  [phone]  

## 2019-11-04 NOTE — Assessment & Plan Note (Signed)
-   Improvement in breathing and reduction in exacerbations  - Continue Trelegy 1 puff daily - Continue Singulair 10mg  daily - Continue Zyrtec 10mg  daily - Monitor symptoms, if consistently needing more oral steroids recommend adding Xolair

## 2019-11-07 ENCOUNTER — Other Ambulatory Visit: Payer: Self-pay | Admitting: *Deleted

## 2019-11-07 DIAGNOSIS — Z87891 Personal history of nicotine dependence: Secondary | ICD-10-CM

## 2019-11-07 DIAGNOSIS — Z122 Encounter for screening for malignant neoplasm of respiratory organs: Secondary | ICD-10-CM

## 2019-11-08 ENCOUNTER — Other Ambulatory Visit: Payer: Self-pay

## 2019-11-08 ENCOUNTER — Encounter (INDEPENDENT_AMBULATORY_CARE_PROVIDER_SITE_OTHER): Payer: Medicare Other | Admitting: Ophthalmology

## 2019-11-08 DIAGNOSIS — H43813 Vitreous degeneration, bilateral: Secondary | ICD-10-CM | POA: Diagnosis not present

## 2019-11-08 DIAGNOSIS — I10 Essential (primary) hypertension: Secondary | ICD-10-CM

## 2019-11-08 DIAGNOSIS — H35033 Hypertensive retinopathy, bilateral: Secondary | ICD-10-CM | POA: Diagnosis not present

## 2019-11-08 DIAGNOSIS — H353231 Exudative age-related macular degeneration, bilateral, with active choroidal neovascularization: Secondary | ICD-10-CM | POA: Diagnosis not present

## 2019-11-19 ENCOUNTER — Other Ambulatory Visit: Payer: Self-pay | Admitting: Primary Care

## 2019-11-21 ENCOUNTER — Telehealth: Payer: Self-pay | Admitting: Primary Care

## 2019-11-21 MED ORDER — TRELEGY ELLIPTA 100-62.5-25 MCG/INH IN AEPB: 1.0000 | INHALATION_SPRAY | Freq: Every day | RESPIRATORY_TRACT | 5 refills | Status: DC

## 2019-11-21 NOTE — Telephone Encounter (Signed)
Spoke with the pt and notified rx for trelegy was refilled  Nothing further needed

## 2019-11-23 ENCOUNTER — Ambulatory Visit
Admission: RE | Admit: 2019-11-23 | Discharge: 2019-11-23 | Disposition: A | Discharge status: Home / Self Care | Payer: Medicare Other | Source: Ambulatory Visit | Attending: Acute Care | Admitting: Acute Care

## 2019-11-23 ENCOUNTER — Ambulatory Visit (INDEPENDENT_AMBULATORY_CARE_PROVIDER_SITE_OTHER): Payer: Medicare Other | Admitting: Acute Care

## 2019-11-23 ENCOUNTER — Encounter: Payer: Self-pay | Admitting: Acute Care

## 2019-11-23 ENCOUNTER — Other Ambulatory Visit: Payer: Self-pay

## 2019-11-23 DIAGNOSIS — Z122 Encounter for screening for malignant neoplasm of respiratory organs: Secondary | ICD-10-CM

## 2019-11-23 DIAGNOSIS — Z87891 Personal history of nicotine dependence: Secondary | ICD-10-CM

## 2019-11-23 DIAGNOSIS — F17210 Nicotine dependence, cigarettes, uncomplicated: Secondary | ICD-10-CM | POA: Diagnosis not present

## 2019-11-23 NOTE — Patient Instructions (Signed)
Thank you for participating in the [facility] Screening Program. It was our pleasure to meet you today. We will call you with the results of your scan within the next few days. Your scan will be assigned a Lung RADS category score by the physicians reading the scans.  This Lung RADS score determines follow up scanning.  See below for description of categories, and follow up screening recommendations. We will be in touch to schedule your follow up screening annually or based on recommendations of our providers. We will fax a copy of your scan results to your Primary Care Physician, or the physician who referred you to the program, to ensure they have the results. Please call the office if you have any questions or concerns regarding your scanning experience or results.  Our office number is [phone]. Please speak with Denise Phelps, RN. She is our Lung Cancer Screening RN. If she is unavailable when you call, please have the office staff send her a message. She will return your call at her earliest convenience. Remember, if your scan is normal, we will scan you annually as long as you continue to meet the criteria for the program. (Age 55-77, Current smoker or smoker who has quit within the last 15 years). If you are a smoker, remember, quitting is the single most powerful action that you can take to decrease your risk of lung cancer and other pulmonary, breathing related problems. We know quitting is hard, and we are here to help.  Please let us know if there is anything we can do to help you meet your goal of quitting. If you are a former smoker, congratulations. We are proud of you! Remain smoke free! Remember you can refer friends or family members through the number above.  We will screen them to make sure they meet criteria for the program. Thank you for helping us take better care of you by participating in Lung Screening.  Lung RADS Categories:  Lung RADS 1: no nodules  or definitely non-concerning nodules.  Recommendation is for a repeat annual scan in 12 months.  Lung RADS 2:  nodules that are non-concerning in appearance and behavior with a very low likelihood of becoming an active cancer. Recommendation is for a repeat annual scan in 12 months.  Lung RADS 3: nodules that are probably non-concerning , includes nodules with a low likelihood of becoming an active cancer.  Recommendation is for a 6-month repeat screening scan. Often noted after an upper respiratory illness. We will be in touch to make sure you have no questions, and to schedule your 6-month scan.  Lung RADS 4 A: nodules with concerning findings, recommendation is most often for a follow up scan in 3 months or additional testing based on our provider's assessment of the scan. We will be in touch to make sure you have no questions and to schedule the recommended 3 month follow up scan.  Lung RADS 4 B:  indicates findings that are concerning. We will be in touch with you to schedule additional diagnostic testing based on our provider's  assessment of the scan.   

## 2019-11-23 NOTE — Progress Notes (Signed)
Shared Decision Making Visit Lung Cancer Screening Program [phone])   Eligibility:  Age 68 y.o.  Pack Years Smoking History Calculation 79 pack year smoking history (# packs/per year x # years smoked)  Recent History of coughing up blood  no  Unexplained weight loss? no ( >Than 15 pounds within the last 6 months )  Prior History Lung / other cancer no (Diagnosis within the last 5 years already requiring surveillance chest CT Scans).  Smoking Status Former Smoker  Former Smokers: Years since quit: < 1 year  Quit Date: 03/03/2019  Visit Components:  Discussion included one or more decision making aids. yes  Discussion included risk/benefits of screening. yes  Discussion included potential follow up diagnostic testing for abnormal scans. yes  Discussion included meaning and risk of over diagnosis. yes  Discussion included meaning and risk of False Positives. yes  Discussion included meaning of total radiation exposure. yes  Counseling Included:  Importance of adherence to annual lung cancer LDCT screening. yes  Impact of comorbidities on ability to participate in the program. yes  Ability and willingness to under diagnostic treatment. yes  Smoking Cessation Counseling:  Current Smokers:   Discussed importance of smoking cessation. no  Information about tobacco cessation classes and interventions provided to patient. yes  Patient provided with "ticket" for LDCT Scan. yes  Symptomatic Patient. no  Counseling  Diagnosis Code: Tobacco Use Z72.0  Asymptomatic Patient yes  Counseling (Intermediate counseling: > three minutes counseling) E5631  Former Smokers:   Discussed the importance of maintaining cigarette abstinence. yes  Diagnosis Code: Personal History of Nicotine Dependence. S97.026  Information about tobacco cessation classes and interventions provided to patient. Yes  Patient provided with "ticket" for LDCT Scan. yes  Written Order for Lung  Cancer Screening with LDCT placed in Epic. Yes (CT Chest Lung Cancer Screening Low Dose W/O CM) [id number] Z12.2-Screening of respiratory organs Z87.891-Personal history of nicotine dependence  I spent 25 minutes of face to face time with Tim Thomas discussing the risks and benefits of lung cancer screening. We viewed a power point together that explained in detail the above noted topics. We took the time to pause the power point at intervals to allow for questions to be asked and answered to ensure understanding. We discussed that he had taken the single most powerful action possible to decrease his risk of developing lung cancer when he quit smoking. I counseled him to remain smoke free, and to contact me if he ever had the desire to smoke again so that I can provide resources and tools to help support the effort to remain smoke free. We discussed the time and location of the scan, and that either  Tim Miyamoto RN or I will call with the results within  24-48 hours of receiving them. He has my card and contact information in the event he needs to speak with me, in addition to a copy of the power point we reviewed as a resource. He verbalized understanding of all of the above and had no further questions upon leaving the office.     I explained to the patient that there has been a high incidence of coronary artery disease noted on these exams. I explained that this is a non-gated exam therefore degree or severity cannot be determined. This patient is currently on statin therapy. I have asked the patient to follow-up with their PCP regarding any incidental finding of coronary artery disease and management with diet or medication as they feel is  clinically indicated. The patient verbalized understanding of the above and had no further questions.     Magdalen Spatz, NP 11/23/2019 10:18 AM

## 2019-11-30 ENCOUNTER — Telehealth: Payer: Self-pay | Admitting: Acute Care

## 2019-11-30 DIAGNOSIS — Z87891 Personal history of nicotine dependence: Secondary | ICD-10-CM

## 2019-11-30 DIAGNOSIS — Z122 Encounter for screening for malignant neoplasm of respiratory organs: Secondary | ICD-10-CM

## 2019-11-30 NOTE — Telephone Encounter (Signed)
Pt informed of CT results per Sarah Groce, NP.  PT verbalized understanding.  Copy sent to PCP.  Order placed for 1 yr f/u CT.  

## 2020-01-10 ENCOUNTER — Encounter (INDEPENDENT_AMBULATORY_CARE_PROVIDER_SITE_OTHER): Payer: Medicare Other | Admitting: Ophthalmology

## 2020-01-10 DIAGNOSIS — H353231 Exudative age-related macular degeneration, bilateral, with active choroidal neovascularization: Secondary | ICD-10-CM | POA: Diagnosis not present

## 2020-01-10 DIAGNOSIS — H43813 Vitreous degeneration, bilateral: Secondary | ICD-10-CM | POA: Diagnosis not present

## 2020-01-10 DIAGNOSIS — I10 Essential (primary) hypertension: Secondary | ICD-10-CM

## 2020-01-10 DIAGNOSIS — H35033 Hypertensive retinopathy, bilateral: Secondary | ICD-10-CM | POA: Diagnosis not present

## 2020-01-14 ENCOUNTER — Ambulatory Visit: Payer: Medicare Other | Attending: Internal Medicine

## 2020-01-14 DIAGNOSIS — Z23 Encounter for immunization: Secondary | ICD-10-CM | POA: Insufficient documentation

## 2020-01-14 NOTE — Progress Notes (Signed)
   Covid-19 Vaccination Clinic  Name:  Fateh Kindle    MRN: [id number] DOB: 12-27-1950  01/14/2020  Mr. Gowan was observed post 30 minutes Covid-19 immunization for  without incidence. He was provided with Vaccine Information Sheet and instruction to access the V-Safe system.   Mr. Amison was instructed to call 911 with any severe reactions post vaccine: Marland Kitchen Difficulty breathing  . Swelling of your face and throat  . A fast heartbeat  . A bad rash all over your body  . Dizziness and weakness    Immunizations Administered    Name Date Dose VIS Date Route   Pfizer COVID-19 Vaccine 01/14/2020 12:15 PM 0.3 mL 12/02/2019 Intramuscular   Manufacturer: [employer], [employer]   Lot: [id number]   NDC: [address]-4

## 2020-02-04 ENCOUNTER — Ambulatory Visit: Payer: Medicare Other | Attending: Internal Medicine

## 2020-02-04 DIAGNOSIS — Z23 Encounter for immunization: Secondary | ICD-10-CM | POA: Insufficient documentation

## 2020-02-04 NOTE — Progress Notes (Signed)
   Covid-19 Vaccination Clinic  Name:  Telesforo Brosnahan    MRN: [id number] DOB: 02-13-1951  02/04/2020  Mr. Scarfo was observed post Covid-19 immunization for 30 minutes based on pre-vaccination screening without incidence. He was provided with Vaccine Information Sheet and instruction to access the V-Safe system.   Mr. Luffman was instructed to call 911 with any severe reactions post vaccine: Marland Kitchen Difficulty breathing  . Swelling of your face and throat  . A fast heartbeat  . A bad rash all over your body  . Dizziness and weakness    Immunizations Administered    Name Date Dose VIS Date Route   Pfizer COVID-19 Vaccine 02/04/2020 10:11 AM 0.3 mL 12/02/2019 Intramuscular   Manufacturer: [employer], [employer]   Lot: [id number]   NDC: [address]-6

## 2020-03-13 ENCOUNTER — Other Ambulatory Visit: Payer: Self-pay

## 2020-03-13 ENCOUNTER — Encounter (INDEPENDENT_AMBULATORY_CARE_PROVIDER_SITE_OTHER): Payer: Medicare Other | Admitting: Ophthalmology

## 2020-03-13 DIAGNOSIS — I10 Essential (primary) hypertension: Secondary | ICD-10-CM

## 2020-03-13 DIAGNOSIS — H353231 Exudative age-related macular degeneration, bilateral, with active choroidal neovascularization: Secondary | ICD-10-CM

## 2020-03-13 DIAGNOSIS — H35033 Hypertensive retinopathy, bilateral: Secondary | ICD-10-CM | POA: Diagnosis not present

## 2020-03-13 DIAGNOSIS — H43813 Vitreous degeneration, bilateral: Secondary | ICD-10-CM | POA: Diagnosis not present

## 2020-04-25 ENCOUNTER — Other Ambulatory Visit: Payer: Self-pay | Admitting: Primary Care

## 2020-05-08 ENCOUNTER — Other Ambulatory Visit: Payer: Self-pay

## 2020-05-08 MED ORDER — MONTELUKAST SODIUM 10 MG PO TABS: ORAL_TABLET | ORAL | 0 refills | Status: DC

## 2020-05-15 ENCOUNTER — Encounter (INDEPENDENT_AMBULATORY_CARE_PROVIDER_SITE_OTHER): Payer: Medicare Other | Admitting: Ophthalmology

## 2020-05-18 ENCOUNTER — Other Ambulatory Visit: Payer: Self-pay | Admitting: Pulmonary Disease

## 2020-05-25 ENCOUNTER — Telehealth: Payer: Self-pay | Admitting: Acute Care

## 2020-05-25 NOTE — Telephone Encounter (Signed)
Spoke with the pt  He states PCP referred him to allergy and asthma center due to elevated IGE level  [facility]  I advised that this is where we usually refer to  Nothing further needed

## 2020-06-05 ENCOUNTER — Other Ambulatory Visit: Payer: Self-pay

## 2020-06-05 ENCOUNTER — Encounter (INDEPENDENT_AMBULATORY_CARE_PROVIDER_SITE_OTHER): Payer: Medicare Other | Admitting: Ophthalmology

## 2020-06-05 DIAGNOSIS — I10 Essential (primary) hypertension: Secondary | ICD-10-CM | POA: Diagnosis not present

## 2020-06-05 DIAGNOSIS — H35033 Hypertensive retinopathy, bilateral: Secondary | ICD-10-CM | POA: Diagnosis not present

## 2020-06-05 DIAGNOSIS — H43813 Vitreous degeneration, bilateral: Secondary | ICD-10-CM

## 2020-06-05 DIAGNOSIS — H353231 Exudative age-related macular degeneration, bilateral, with active choroidal neovascularization: Secondary | ICD-10-CM

## 2020-07-03 ENCOUNTER — Ambulatory Visit (INDEPENDENT_AMBULATORY_CARE_PROVIDER_SITE_OTHER): Payer: Medicare Other | Admitting: Pulmonary Disease

## 2020-07-03 ENCOUNTER — Other Ambulatory Visit: Payer: Self-pay

## 2020-07-03 ENCOUNTER — Encounter: Payer: Self-pay | Admitting: Pulmonary Disease

## 2020-07-03 VITALS — BP 118/68 | HR 70 | Temp 98.2°F | Ht 71.0 in | Wt 240.6 lb

## 2020-07-03 DIAGNOSIS — R0683 Snoring: Secondary | ICD-10-CM | POA: Diagnosis not present

## 2020-07-03 DIAGNOSIS — J449 Chronic obstructive pulmonary disease, unspecified: Secondary | ICD-10-CM | POA: Diagnosis not present

## 2020-07-03 DIAGNOSIS — J301 Allergic rhinitis due to pollen: Secondary | ICD-10-CM

## 2020-07-03 NOTE — Patient Instructions (Signed)
Try using saline nasal spray daily to help clear your sinuses  Monitor your sleep pattern at home  Follow up in 6 months

## 2020-07-03 NOTE — Progress Notes (Signed)
[facility]  Chief Complaint  Patient presents with  . Follow-up    Occassionally have intermittent shortness of breath for a couple of days and then resolves    Constitutional:  BP 118/68 (BP Location: Left Arm, Cuff Size: Normal)   Pulse 70   Temp 98.2 F (36.8 C) (Oral)   Ht 5\' 11"  (1.803 m)   Wt 240 lb 9.6 oz (109.1 kg)   SpO2 96%   BMI 33.56 kg/m   Past Medical History:  HTN, HLD, Prostatitis, Macular degeneration, BPH  Brief Summary:  Tim Thomas is a 69 y.o. male former smoker with COPD/asthma.  Subjective:   He has been getting episodes of nasal congestion and cough.  This happens more in the morning.  Not having wheeze, chest pain, or leg swelling.  Has been snoring more, and feels more tired during the day.  Is on levofloxacin for prostatitis.  Has noticed stiffness in his calves.  Was on ciprofloxacin before and had neck stiffness.  Physical Exam:   Appearance - well kempt   ENMT - no sinus tenderness, no oral exudate, no LAN, Mallampati 4 airway, no stridor  Respiratory - equal breath sounds bilaterally, no wheezing or rales  CV - s1s2 regular rate and rhythm, no murmurs  Ext - no clubbing, no edema  Skin - no rashes  Psych - normal mood and affect  Assessment/Plan:   COPD with asthma and emphysema. - continue trelegy, singulair - prn albuterol  Allergic rhinitis. - continue flonase, zyrtec, singulair - try nasal irrigation  History of tobacco abuse. - he will need f/u low dose CT chest in December 2021  Snoring. - asked him to monitor his sleep pattern and then call if he wants to set up a home sleep study  Prostatitis. - he reports stiffness in calf muscles since being on quinolone; advised him to discuss with prescribing provider and determine if he should be switched to different class of ABx  A total of  34 minutes spent addressing patient care issues on day of visit.   Follow up:    Patient Instructions  Try using saline nasal spray daily to help clear your sinuses  Monitor your sleep pattern at home  Follow up in 6 months   Signature:  January 2022, MD [place] [facility] Pager: [phone] 07/03/2020, 11:04 AM  Flow Sheet     Pulmonary tests:   PFT 05/12/12>>FEV1 3.37(99%), FEV1% 68, TLC 7.33(103%), DLCO 116%, +BD  Spirometry 08/20/15 >> FEV1 3.30 (105%), FEV1% 76  Spirometry 10/01/18 >> FEV1 3.2 (108%), FEV1% 73  PFT 11/02/19 >> FEV1 3.64 (119%), FEV1% 75, TLC 8.31 (115%), DLCO 120%, +BD from FEF 25-75%  Chest imaging:   Low dose CT chest 09/02/16 >> paraseptal emphysema, bronchial wall thickening  Low dose CT chest 11/23/19 >> emphysema, atherosclerosis  Cardiac tests:   Echo 05/24/19 >> EF above 65%, impaired relaxation   Medications:   Allergies as of 07/03/2020      Reactions   Bee Venom Swelling   Facial and sit of sting swelling Has EPi-pen   Ciprofloxacin Other (See Comments)   Neck stiffness and pains   Ivp Dye [iodinated Diagnostic Agents] Hives, Other (See Comments)   Pt is allergic to contrast material injected by an opthamologist, not contrast used by radiology///a.calhoun Benadryl works for prophylaxis      Medication List       Accurate as of July 03, 2020 11:04 AM. If you have  any questions, ask your nurse or doctor.        STOP taking these medications   predniSONE 10 MG tablet Commonly known as: DELTASONE Stopped by: Coralyn Helling, MD     TAKE these medications   acetaminophen 500 MG tablet Commonly known as: TYLENOL Take 500 mg by mouth every 6 (six) hours as needed for moderate pain.   albuterol 108 (90 Base) MCG/ACT inhaler Commonly known as: ProAir HFA Inhale 2 puffs into the lungs every 6 (six) hours as needed for wheezing or shortness of breath.   amLODipine 10 MG tablet Commonly known as: NORVASC Take 10 mg by mouth daily.   cetirizine 10 MG tablet Commonly known as: ZYRTEC Take  10 mg by mouth daily. As needed   EPINEPHrine 0.3 mg/0.3 mL Soaj injection Commonly known as: EPI-PEN Inject 0.3 mLs (0.3 mg total) into the muscle once. What changed:   when to take this  reasons to take this   levofloxacin 750 MG tablet Commonly known as: LEVAQUIN Take 750 mg by mouth daily. For 10 days   Linzess 145 MCG Caps capsule Generic drug: linaclotide Take 145 mcg by mouth daily as needed.   losartan 100 MG tablet Commonly known as: COZAAR Take 100 mg by mouth daily.   montelukast 10 MG tablet Commonly known as: SINGULAIR TAKE 1 TABLET BY MOUTH EVERYDAY AT BEDTIME   rosuvastatin 20 MG tablet Commonly known as: CRESTOR Take 20 mg by mouth daily.   tamsulosin 0.4 MG Caps capsule Commonly known as: FLOMAX Take 0.4 mg by mouth.   Trelegy Ellipta 100-62.5-25 MCG/INH Aepb Generic drug: Fluticasone-Umeclidin-Vilant TAKE 1 PUFF BY MOUTH EVERY DAY       Past Surgical History:  He  has a past surgical history that includes left ring finger surgery (1970's); Tonsillectomy (as child); colonscopy (few yrs ago); sigmoidscopy; divertilosis; and Transurethral resection of prostate (N/A, 03/19/2018).  Family History:  His family history includes COPD in his mother; Lung cancer in his mother; Thyroid disease in his sister.  Social History:  He  reports that he quit smoking about 16 months ago. He has a 79.50 pack-year smoking history. He has never used smokeless tobacco. He reports current alcohol use. He reports that he does not use drugs.

## 2020-07-12 ENCOUNTER — Emergency Department (HOSPITAL_COMMUNITY)
Admission: EM | Admit: 2020-07-12 | Discharge: 2020-07-13 | Disposition: A | Discharge status: Home / Self Care | Payer: Medicare Other | Attending: Emergency Medicine | Admitting: Emergency Medicine

## 2020-07-12 ENCOUNTER — Encounter (HOSPITAL_COMMUNITY): Payer: Self-pay

## 2020-07-12 DIAGNOSIS — R339 Retention of urine, unspecified: Secondary | ICD-10-CM | POA: Diagnosis not present

## 2020-07-12 DIAGNOSIS — Z466 Encounter for fitting and adjustment of urinary device: Secondary | ICD-10-CM | POA: Insufficient documentation

## 2020-07-12 DIAGNOSIS — Z5321 Procedure and treatment not carried out due to patient leaving prior to being seen by health care provider: Secondary | ICD-10-CM | POA: Insufficient documentation

## 2020-07-12 NOTE — ED Triage Notes (Signed)
Pt arrives to ED w/ c/o urinary retention post foley cath removal. Pt states he has not voided since 2230 yesterday. Pt reports 5/10 pain at lower abdomen.

## 2020-07-12 NOTE — ED Notes (Signed)
Pt leaving AMA, advised to return if symptoms worsen. 

## 2020-07-12 NOTE — ED Notes (Signed)
Bladder scan 132 ml

## 2020-07-17 ENCOUNTER — Encounter (INDEPENDENT_AMBULATORY_CARE_PROVIDER_SITE_OTHER): Payer: Medicare Other | Admitting: Ophthalmology

## 2020-07-17 ENCOUNTER — Other Ambulatory Visit: Payer: Self-pay

## 2020-07-17 DIAGNOSIS — H35033 Hypertensive retinopathy, bilateral: Secondary | ICD-10-CM | POA: Diagnosis not present

## 2020-07-17 DIAGNOSIS — H353231 Exudative age-related macular degeneration, bilateral, with active choroidal neovascularization: Secondary | ICD-10-CM

## 2020-07-17 DIAGNOSIS — H43813 Vitreous degeneration, bilateral: Secondary | ICD-10-CM | POA: Diagnosis not present

## 2020-07-17 DIAGNOSIS — I10 Essential (primary) hypertension: Secondary | ICD-10-CM

## 2020-07-17 DIAGNOSIS — H2513 Age-related nuclear cataract, bilateral: Secondary | ICD-10-CM

## 2020-07-19 ENCOUNTER — Ambulatory Visit: Payer: Medicare Other | Admitting: Allergy

## 2020-08-14 ENCOUNTER — Other Ambulatory Visit: Payer: Self-pay

## 2020-08-14 ENCOUNTER — Encounter (INDEPENDENT_AMBULATORY_CARE_PROVIDER_SITE_OTHER): Payer: Medicare Other | Admitting: Ophthalmology

## 2020-08-14 DIAGNOSIS — I10 Essential (primary) hypertension: Secondary | ICD-10-CM | POA: Diagnosis not present

## 2020-08-14 DIAGNOSIS — H35033 Hypertensive retinopathy, bilateral: Secondary | ICD-10-CM | POA: Diagnosis not present

## 2020-08-14 DIAGNOSIS — H43813 Vitreous degeneration, bilateral: Secondary | ICD-10-CM | POA: Diagnosis not present

## 2020-08-14 DIAGNOSIS — H353231 Exudative age-related macular degeneration, bilateral, with active choroidal neovascularization: Secondary | ICD-10-CM

## 2020-08-14 DIAGNOSIS — H2513 Age-related nuclear cataract, bilateral: Secondary | ICD-10-CM

## 2020-08-20 ENCOUNTER — Other Ambulatory Visit: Payer: Self-pay | Admitting: Pulmonary Disease

## 2020-08-23 ENCOUNTER — Ambulatory Visit (INDEPENDENT_AMBULATORY_CARE_PROVIDER_SITE_OTHER): Payer: Medicare Other | Admitting: Allergy

## 2020-08-23 ENCOUNTER — Other Ambulatory Visit: Payer: Self-pay

## 2020-08-23 ENCOUNTER — Encounter: Payer: Self-pay | Admitting: Allergy

## 2020-08-23 VITALS — BP 116/66 | HR 78 | Temp 97.9°F | Resp 18 | Ht 71.5 in | Wt 234.4 lb

## 2020-08-23 DIAGNOSIS — J3089 Other allergic rhinitis: Secondary | ICD-10-CM | POA: Diagnosis not present

## 2020-08-23 DIAGNOSIS — H1013 Acute atopic conjunctivitis, bilateral: Secondary | ICD-10-CM

## 2020-08-23 DIAGNOSIS — IMO0001 Reserved for inherently not codable concepts without codable children: Secondary | ICD-10-CM

## 2020-08-23 DIAGNOSIS — T508X5D Adverse effect of diagnostic agents, subsequent encounter: Secondary | ICD-10-CM | POA: Diagnosis not present

## 2020-08-23 DIAGNOSIS — L503 Dermatographic urticaria: Secondary | ICD-10-CM

## 2020-08-23 DIAGNOSIS — R768 Other specified abnormal immunological findings in serum: Secondary | ICD-10-CM

## 2020-08-23 DIAGNOSIS — T7800XD Anaphylactic reaction due to unspecified food, subsequent encounter: Secondary | ICD-10-CM

## 2020-08-23 DIAGNOSIS — T63441D Toxic effect of venom of bees, accidental (unintentional), subsequent encounter: Secondary | ICD-10-CM

## 2020-08-23 DIAGNOSIS — J449 Chronic obstructive pulmonary disease, unspecified: Secondary | ICD-10-CM

## 2020-08-23 MED ORDER — AZELASTINE HCL 0.1 % NA SOLN
2.0000 | Freq: Two times a day (BID) | NASAL | 5 refills | Status: DC
Start: 2020-08-23 — End: 2021-05-14

## 2020-08-23 NOTE — Progress Notes (Signed)
New Patient Note  RE: Kalvin Buss MRN: [id number] DOB: [birth year] Date of Office Visit: 08/23/2020  Referring provider: Ralene Ok, MD Primary care provider: Ralene Ok, MD  Chief Complaint: Allergies, high IgE  History of present illness: Tim Thomas is a 69 y.o. male presenting today for consultation for elevated IgE.  He reports skin itching, itchy/watery eyes, runny/stuffy nose with post nasal drip, sneezing.  The symptoms can be year-round. He is on zyrtec, singulair and flonase and this helps some.   He states he did undergo allergen immunotherapy in the 70s but states he stopped these on his own.  He states back then he was told he was allergic to everything.  He reports he never had any problem eating shellfish however after he had a IV dye reaction that involved development of hives he was told he was allergic to shellfish to.  He states after that with shrimp and other shellfish he does note itching.    He notes itching sometimes with strawberry but does not occur every ingestion.   He states with candy corn he has had hives after eating.  He believes this is likely due to the food diet as he eats a lot of sugary foods without issue.   With bee stings he develops significant swelling and has used his epipen before for this.  He states honeybees and yellow jackets are the insects he has reactions with.  He states this has been issue since 53s.  He follows with Dr. Craige Cotta for COPD/asthma and he is on Trelegy currently.  Review of systems: Review of Systems  Constitutional: Negative.   HENT:       See HPI  Eyes:       See HPI  Respiratory: Negative.   Cardiovascular: Negative.   Gastrointestinal: Negative.   Musculoskeletal: Negative.   Skin: Negative.   Neurological: Negative.     All other systems negative unless noted above in HPI  Past medical history: Past Medical History:  Diagnosis Date  . Anxiety   . BPH (benign prostatic hyperplasia)   . COPD  (chronic obstructive pulmonary disease) (HCC)   . 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 Foley catheter in place    02-17-18, occ clots in bag  . High cholesterol   . Hypertension   . Prostate enlargement   . Shoulder pain    right chronic  . Urinary retention     august  2018  . Wet senile macular degeneration (HCC)     right eye current tx, left eye tx in past    Past surgical history: Past Surgical History:  Procedure Laterality Date  . colonscopy  few yrs ago   x 2 polyps removed  . 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 left ring finger surgery  1970's  . sigmoidscopy    . TONSILLECTOMY  as child  . TRANSURETHRAL RESECTION OF PROSTATE N/A 03/19/2018   Procedure: TRANSURETHRAL RESECTION OF THE PROSTATE (TURP);  Surgeon: Ihor Gully, MD;  Location: [facility];  Service: Urology;  Laterality: N/A;    Family history:  Family History  Problem Relation Age of Onset  . Lung cancer Mother   . COPD Mother   . Thyroid disease Sister     Social history: Lives in a home with carpeting in the bedroom with gas heating and central cooling.  Dog in the home.  There is no concern for water damage, mildew or roaches in the home.  Occupational History  . Occupation: retired  Tobacco Use  . Smoking status: Former Smoker    Packs/day: 1.50    Years: 53.00    Pack years: 79.50    Quit date: 03/20/2019    Years since quitting: 1.4  . Smokeless tobacco: Never Used  Vaping Use  . Vaping Use: Never used     Medication List: Current Outpatient Medications  Medication Sig Dispense Refill  . acetaminophen (TYLENOL) 500 MG tablet Take 500 mg by mouth every 6 (six) hours as needed for moderate pain.    Marland Kitchen albuterol (PROAIR HFA) 108 (90 Base) MCG/ACT inhaler Inhale 2 puffs into the lungs every 6 (six) hours as needed for wheezing or shortness of breath. 18 g 2  . amLODipine (NORVASC) 10 MG tablet Take 10 mg by mouth daily.     . cetirizine (ZYRTEC) 10 MG tablet Take 10 mg  by mouth daily. As needed    . EPINEPHrine 0.3 mg/0.3 mL IJ SOAJ injection Inject 0.3 mLs (0.3 mg total) into the muscle once. (Patient taking differently: Inject 0.3 mg into the muscle as needed for anaphylaxis. ) 2 Device 1  . linaclotide (LINZESS) 145 MCG CAPS capsule Take 145 mcg by mouth daily as needed.    Marland Kitchen losartan (COZAAR) 100 MG tablet Take 100 mg by mouth daily.    . meloxicam (MOBIC) 15 MG tablet Take 15 mg by mouth daily.    . montelukast (SINGULAIR) 10 MG tablet TAKE 1 TABLET BY MOUTH EVERYDAY AT BEDTIME 90 tablet 3  . rosuvastatin (CRESTOR) 20 MG tablet Take 20 mg by mouth daily.     . tamsulosin (FLOMAX) 0.4 MG CAPS capsule Take 0.4 mg by mouth.    . TRELEGY ELLIPTA 100-62.5-25 MCG/INH AEPB TAKE 1 PUFF BY MOUTH EVERY DAY 60 each 5   No current facility-administered medications for this visit.    Known medication allergies: Allergies  Allergen Reactions  . Bee Venom Swelling    Facial and sit of sting swelling Has EPi-pen  . Ciprofloxacin Other (See Comments)    Neck stiffness and pains  . Ivp Dye [Iodinated Diagnostic Agents] Hives and Other (See Comments)    Pt is allergic to contrast material injected by an opthamologist, not contrast used by radiology///a.calhoun Benadryl works for prophylaxis     Physical examination: Blood pressure 116/66, pulse 78, temperature 97.9 F (36.6 C), temperature source Temporal, resp. rate 18, height 5' 11.5" (1.816 m), weight 234 lb 6.4 oz (106.3 kg), SpO2 99 %.  General: Alert, interactive, in no acute distress. HEENT: PERRLA, TMs pearly gray, turbinates minimally edematous without discharge, post-pharynx non erythematous. Neck: Supple without lymphadenopathy. Lungs: Clear to auscultation without wheezing, rhonchi or rales. {no increased work of breathing. CV: Normal S1, S2 without murmurs. Abdomen: Nondistended, nontender. Skin: Warm and dry, without lesions or rashes. Extremities:  No clubbing, cyanosis or edema. Neuro:    Grossly intact.  Diagnositics/Labs: Labs: Reviewed labs from PCP from 05/16/2020 with a normal CBC and eosinophil count, unremarkable CMP with mild elevation of glucose at 117, total IgE 685  Allergy testing: He has positive for dermatographia with erythematous wheal at stroke site from the pen.  He does have palpable and visible wheal and flare to environmental allergens as well  Environmental allergy skin prick testing is positive to grass pollens, weed pollens, tree pollens, molds, dust mites, cat, dog, mixed feathers, horse, cockroach, mouse and tobacco leaf. Select food allergy skin prick testing is positive to shrimp, crab, lobster strawberry.  Allergy testing results were read and interpreted by provider, documented by clinical staff.   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   His IgE however is not significantly elevated that warrants further testing at this time.   -he continues to have environmental allergy as well as history of stinging insect allergy, medication reaction to IV contrast, food allergy and asthma/COPD  -Environmental allergy skin prick testing today is positive to grass pollen, weed pollen, tree pollen, molds, dust mites, cat, dog, mixed feathers, horse, cockroach, mouse and tobacco leaf.  Allergen avoidance measures provided  -Select food allergy skin prick testing is positive to shrimp, crab, lobster and strawberry.  -He is also dermatographic.  Which indicates that his mast cells are quite sensitive and reactive to strokes of the skin.  When gone  -For management of your environmental allergy symptoms recommend taking a long-acting antihistamine like Zyrtec 10 mg, Allegra 180 mg or Xyzal 5 mg.  If you feel like Zyrtec is not very effective then would try a one  of the other recommendations -Continue Singulair 10 mg daily at bedtime -For nasal congestion continue Flonase 2 sprays each nostril daily for 1 to 2 weeks at a time before stopping once symptoms improve -For nasal drainage recommend use of nasal antihistamine, Astelin 2 sprays each nostril twice a day as needed -For itchy watery eyes recommend use of olopatadine 0.2% 1 drop each eye daily as needed -If medication management is not effective enough consider another course of allergen immunotherapy (allergy shots)  -Recommend avoidance of shellfish due to positive testing and symptoms with ingestion -Recommend caution with strawberry ingestion.  You do have positive testing to strawberry however strawberry is a known food that can cause itching or hives due to histamine release -Recommend avoiding candy corn do to symptoms likely related to the food dye -Continue to have access to self-injectable epinephrine (Epipen or AuviQ) 0.3mg  at all times -Follow emergency action plan in case of allergic reaction  -Continue to avoid stinging insects as much as possible and have access to your epinephrine device as above -Recommend premedication regimen if needing to have imaging studies that require IV contrast.  Radiology is typically aware of the premedication regimen to use.  -Continue medication recommendations per Dr. Craige Cotta including Trelegy and as needed albuterol -If needing to step up your asthma/COPD therapy may consider Dupixent as this would have impact on your IgE levels  Follow-up in 4 to 6 months or sooner if needed  I appreciate the opportunity to take part in Azul's care. Please do not hesitate to contact me with questions.  Sincerely,   Margo Aye, MD Allergy/Immunology [facility]

## 2020-08-23 NOTE — Patient Instructions (Addendum)
Elevated IgE   - you have an elevated IgE.  IgE is the allergy antibody.  Those who have allergies weather environmental, food, stinging insects, medications etc. will have more IgE than those with no allergies.  Your IgE however is not significantly elevated that warrants further testing at this time.  You definitely still continued to have environmental allergy with results from skin testing as below.  He also have history of stinging insect allergy and will make IgE to stinging insects.  He also has had reaction with IV contrast done thus you likely have made IgE related to this as well.  You have had symptoms following shellfish and strawberry and testing today is positive as below that she have IgE to these as well.  We can also see folks who have asthma have elevated IgE if environmental allergens or other allergens drive asthma symptoms.  -Environmental allergy skin prick testing today is positive to grass pollen, weed pollen, tree pollen, molds, dust mites, cat, dog, mixed feathers, horse, cockroach, mouse and tobacco leaf.  Allergen avoidance measures provided  -Select food allergy skin prick testing is positive to shrimp, crab, lobster and strawberry.  -You also are dermatographic.  This indicates that your allergy cells are quite sensitive and reactive to strokes of the skin.   -For management of your environmental allergy symptoms recommend taking a long-acting antihistamine like Zyrtec 10 mg, Allegra 180 mg or Xyzal 5 mg.  If you feel like Zyrtec is not very effective then would try a one of the other recommendations -Continue Singulair 10 mg daily at bedtime -For nasal congestion continue Flonase 2 sprays each nostril daily for 1 to 2 weeks at a time before stopping once symptoms improve -For nasal drainage recommend use of nasal antihistamine, Astelin 2 sprays each nostril twice a day as needed -For itchy watery eyes recommend use of olopatadine 0.2% 1 drop each eye daily as needed -If  medication management is not effective enough consider another course of allergen immunotherapy (allergy shots)  -Recommend avoidance of shellfish due to positive testing and symptoms with ingestion -Recommend caution with strawberry ingestion.  You do have positive testing to strawberry however strawberry is a known food that can cause itching or hives due to histamine release -Recommend avoiding candy corn do to symptoms likely related to the food dye -Continue to have access to self-injectable epinephrine (Epipen or AuviQ) 0.3mg  at all times -Follow emergency action plan in case of allergic reaction  -Continue to avoid stinging insects as much as possible and have access to your epinephrine device as above -Recommend premedication regimen if needing to have imaging studies that require IV contrast.  Radiology is typically aware of the premedication regimen to use.  -Continue medication recommendations per Dr. Craige Cotta -If needing to step up your asthma/COPD therapy may consider Dupixent as this would have impact on your IgE levels  Follow-up in 4 to 6 months or sooner if needed

## 2020-09-11 ENCOUNTER — Other Ambulatory Visit: Payer: Self-pay

## 2020-09-11 ENCOUNTER — Encounter (INDEPENDENT_AMBULATORY_CARE_PROVIDER_SITE_OTHER): Payer: Medicare Other | Admitting: Ophthalmology

## 2020-09-11 DIAGNOSIS — H353231 Exudative age-related macular degeneration, bilateral, with active choroidal neovascularization: Secondary | ICD-10-CM | POA: Diagnosis not present

## 2020-09-11 DIAGNOSIS — H35033 Hypertensive retinopathy, bilateral: Secondary | ICD-10-CM | POA: Diagnosis not present

## 2020-09-11 DIAGNOSIS — I10 Essential (primary) hypertension: Secondary | ICD-10-CM | POA: Diagnosis not present

## 2020-09-11 DIAGNOSIS — H43813 Vitreous degeneration, bilateral: Secondary | ICD-10-CM

## 2020-09-12 ENCOUNTER — Encounter (INDEPENDENT_AMBULATORY_CARE_PROVIDER_SITE_OTHER): Payer: Medicare Other | Admitting: Ophthalmology

## 2020-10-06 ENCOUNTER — Ambulatory Visit: Payer: Medicare Other | Attending: Internal Medicine

## 2020-10-06 ENCOUNTER — Other Ambulatory Visit: Payer: Self-pay

## 2020-10-06 DIAGNOSIS — Z23 Encounter for immunization: Secondary | ICD-10-CM

## 2020-10-06 NOTE — Progress Notes (Signed)
   Covid-19 Vaccination Clinic  Name:  Tim Thomas    MRN: [id number] DOB: 12/17/1951  10/06/2020  [name] was observed post Covid-19 immunization for 30 minutes based on pre-vaccination screening without incident. He was provided with Vaccine Information Sheet and instruction to access the V-Safe system.   [name] was instructed to call 911 with any severe reactions post vaccine: Marland Kitchen Difficulty breathing  . Swelling of face and throat  . A fast heartbeat  . A bad rash all over body  . Dizziness and weakness

## 2020-10-09 ENCOUNTER — Encounter (INDEPENDENT_AMBULATORY_CARE_PROVIDER_SITE_OTHER): Payer: Medicare Other | Admitting: Ophthalmology

## 2020-10-16 ENCOUNTER — Encounter (INDEPENDENT_AMBULATORY_CARE_PROVIDER_SITE_OTHER): Payer: Medicare Other | Admitting: Ophthalmology

## 2020-10-16 ENCOUNTER — Other Ambulatory Visit: Payer: Self-pay

## 2020-10-16 DIAGNOSIS — H353231 Exudative age-related macular degeneration, bilateral, with active choroidal neovascularization: Secondary | ICD-10-CM

## 2020-10-16 DIAGNOSIS — H43813 Vitreous degeneration, bilateral: Secondary | ICD-10-CM | POA: Diagnosis not present

## 2020-10-16 DIAGNOSIS — I10 Essential (primary) hypertension: Secondary | ICD-10-CM | POA: Diagnosis not present

## 2020-10-16 DIAGNOSIS — H35033 Hypertensive retinopathy, bilateral: Secondary | ICD-10-CM

## 2020-11-16 ENCOUNTER — Other Ambulatory Visit: Payer: Self-pay | Admitting: Pulmonary Disease

## 2020-11-21 ENCOUNTER — Telehealth: Payer: Self-pay | Admitting: Pulmonary Disease

## 2020-11-21 MED ORDER — ALBUTEROL SULFATE HFA 108 (90 BASE) MCG/ACT IN AERS: 2.0000 | INHALATION_SPRAY | Freq: Four times a day (QID) | RESPIRATORY_TRACT | 2 refills | Status: DC | PRN

## 2020-11-21 NOTE — Telephone Encounter (Signed)
ATC patient x1.  LVM letting patient know that albuterol had been sent to his pharmacy and to call with any questions.

## 2020-11-24 ENCOUNTER — Other Ambulatory Visit: Payer: Self-pay | Admitting: Pulmonary Disease

## 2020-11-29 ENCOUNTER — Ambulatory Visit
Admission: RE | Admit: 2020-11-29 | Discharge: 2020-11-29 | Disposition: A | Discharge status: Home / Self Care | Payer: Medicare Other | Source: Ambulatory Visit | Attending: Acute Care | Admitting: Acute Care

## 2020-11-29 ENCOUNTER — Other Ambulatory Visit: Payer: Self-pay

## 2020-11-29 DIAGNOSIS — Z122 Encounter for screening for malignant neoplasm of respiratory organs: Secondary | ICD-10-CM

## 2020-11-29 DIAGNOSIS — Z87891 Personal history of nicotine dependence: Secondary | ICD-10-CM

## 2020-12-07 ENCOUNTER — Other Ambulatory Visit: Payer: Self-pay | Admitting: *Deleted

## 2020-12-07 DIAGNOSIS — Z87891 Personal history of nicotine dependence: Secondary | ICD-10-CM

## 2020-12-07 NOTE — Progress Notes (Signed)
Please call patient and let them  know their  low dose Ct was read as a Lung RADS 1, negative study: no nodules or definitely benign nodules. Radiology recommendation is for a repeat LDCT in 12 months. Please let them  know we will order and schedule their  annual screening scan for 11/2021. Please let them  know there was notation of CAD on their  scan.  Please remind the patient  that this is a non-gated exam therefore degree or severity of disease  cannot be determined. Please have them  follow up with their PCP regarding potential risk factor modification, dietary therapy or pharmacologic therapy if clinically indicated. Pt.  is  currently on statin therapy. Please place order for annual  screening scan for  11/2021 and fax results to PCP. Thanks so much. 

## 2020-12-07 NOTE — Progress Notes (Signed)
Tim Thomas, He has been seen by cards once last year. Have him discuss follow up with his PCP. Thanks

## 2020-12-11 ENCOUNTER — Encounter (INDEPENDENT_AMBULATORY_CARE_PROVIDER_SITE_OTHER): Payer: Medicare Other | Admitting: Ophthalmology

## 2020-12-28 ENCOUNTER — Encounter (INDEPENDENT_AMBULATORY_CARE_PROVIDER_SITE_OTHER): Payer: Medicare Other | Admitting: Ophthalmology

## 2021-01-01 ENCOUNTER — Encounter (INDEPENDENT_AMBULATORY_CARE_PROVIDER_SITE_OTHER): Payer: Self-pay | Admitting: Ophthalmology

## 2021-01-08 ENCOUNTER — Telehealth: Payer: Self-pay | Admitting: Pulmonary Disease

## 2021-01-08 NOTE — Telephone Encounter (Signed)
Called and spoke to pt. Appt changed to televisit on 01/11/21 at 12p. Pt states the back discomfort is still present but has improved some since the weekend. Pt states he can wait until his visit with Dr. Craige Cotta on 01/11/21 for it to be addressed. Advised pt to call us back if the discomfort worsens or if cough worsens. Pt verbalized understanding and denied any further questions or concerns at this time.

## 2021-01-08 NOTE — Telephone Encounter (Signed)
Spoke with pt who states step-daughter and wife both tested positive for Covid last week. Pt himself tested negative x2 with in last week. Pt has developed productive cough (clear) with back ache which he said has improved somewhat but is still consitant . Pt is wanting to switch OV to televisit on 01/11/2021 and would like something called in to help with his cough. Pt denies n/v/fever. Dr. Craige Cotta will you please advise on switching visit to televisit and cough.

## 2021-01-08 NOTE — Telephone Encounter (Signed)
Okay to switch to tele visit.

## 2021-01-11 ENCOUNTER — Ambulatory Visit (INDEPENDENT_AMBULATORY_CARE_PROVIDER_SITE_OTHER): Payer: Medicare Other | Admitting: Pulmonary Disease

## 2021-01-11 ENCOUNTER — Other Ambulatory Visit: Payer: Self-pay

## 2021-01-11 ENCOUNTER — Encounter: Payer: Self-pay | Admitting: Pulmonary Disease

## 2021-01-11 VITALS — BP 140/83 | HR 76 | Temp 97.9°F

## 2021-01-11 DIAGNOSIS — J4489 Other specified chronic obstructive pulmonary disease: Secondary | ICD-10-CM

## 2021-01-11 DIAGNOSIS — J441 Chronic obstructive pulmonary disease with (acute) exacerbation: Secondary | ICD-10-CM

## 2021-01-11 DIAGNOSIS — J449 Chronic obstructive pulmonary disease, unspecified: Secondary | ICD-10-CM

## 2021-01-11 DIAGNOSIS — U071 COVID-19: Secondary | ICD-10-CM | POA: Diagnosis not present

## 2021-01-11 MED ORDER — PREDNISONE 10 MG PO TABS: ORAL_TABLET | ORAL | 0 refills | Status: AC

## 2021-01-11 NOTE — Progress Notes (Signed)
[facility]  Chief Complaint  Patient presents with  . Follow-up    6 month f/u for COPD/asthma. States he tested positive this morning via rapid test. Increased SOB, O2 has been running 96-98%. Temp was 97.67F. Pain in the middle of his back. Non-productive cough. Has been using Mucinex for cough. Has noticed a little wheezing after cough.     Constitutional:  BP 140/83   Pulse 76   Temp 97.9 F (36.6 C) (Temporal)   SpO2 96%   Past Medical History:  HTN, HLD, Prostatitis, Macular degeneration, BPH, COVID 19 infection January 2022  Past Surgical History:  He  has a past surgical history that includes left ring finger surgery (1970's); Tonsillectomy (as child); colonscopy (few yrs ago); sigmoidscopy; divertilosis; and Transurethral resection of prostate (N/A, 03/19/2018).  Brief Summary:  Tim Thomas is a 70 y.o. male former smoker with COPD/asthma.      Subjective:  Virtual Visit via Telephone Note  I connected with Allegra Grana on 01/11/21 at 12:00 PM EST by telephone and verified that I am speaking with the correct person using two identifiers.  Location: Patient: home Provider: medical office   I discussed the limitations, risks, security and privacy concerns of performing an evaluation and management service by telephone and the availability of in person appointments. I also discussed with the patient that there may be a patient responsible charge related to this service. The patient expressed understanding and agreed to proceed.  His son-in-law tested positive for COVID earlier this month.  His wife and daughter tested positive for COVID on 1/10.  He tested negative on 1/10 and 1/12.  He started getting symptoms on 1/14.  He was having congestion.  He has been feeling more fatigued and achy.  He started getting some diarrhea last night.  He did rapid antigen COVID test this morning and is now positive.  Has noticed feeling  tight in his chest and has back pain.  Tried tylenol, but didn't help much.  He has been monitoring his SpO2 at home - stays 95 to 98%.  No fever.  He has received 3 COVID vaccine injections.  LDCT chest from December was stable.  Physical Exam:   Deferred.    Pulmonary testing:   PFT 05/12/12>>FEV1 3.37(99%), FEV1% 68, TLC 7.33(103%), DLCO 116%, +BD  Spirometry 08/20/15 >> FEV1 3.30 (105%), FEV1% 76  Spirometry 10/01/18 >> FEV1 3.2 (108%), FEV1% 73  PFT 11/02/19 >> FEV1 3.64 (119%), FEV1% 75, TLC 8.31 (115%), DLCO 120%, +BD from FEF 25-75%  Chest Imaging:   Low dose CT chest 09/02/16 >> paraseptal emphysema, bronchial wall thickening  Low dose CT chest 11/23/19 >> emphysema, atherosclerosis  Low dose CT chest 11/29/20 >> no change  Cardiac Tests:   Echo 05/24/19 >> EF above 65%, impaired relaxation  Social History:  He  reports that he quit smoking about 22 months ago. He has a 79.50 pack-year smoking history. He has never used smokeless tobacco. He reports current alcohol use. He reports that he does not use drugs.  Family History:  His family history includes COPD in his mother; Lung cancer in his mother; Thyroid disease in his sister.     Assessment/Plan:   COVID 19 infection. - explained that since his symptoms started more than 7 days ago he wouldn't be a candidate for monoclonal antibody therapy - advised him to monitor his temperature and oxygenation at home - will arrange for chest xray to assess his  symptoms of chest tightness and back pain; suspect this is related to COPD exacerbation but could be pneumothorax or pneumomediastinum  COPD with asthma and emphysema. - he has mild exacerbation - don't think he needs antibiotics at this time - will give him a course of prednisone; this should be of benefit for COVID infection also - continue trelegy and singulair - prn albuterol  Allergic rhinitis. - continue flonase, zyrtec, singulair - try nasal  irrigation  History of tobacco abuse. - f/u LDCT chest in December 2022  Snoring. - asked him to monitor his sleep pattern and then call if he wants to set up a home sleep study  Time Spent Involved in Patient Care on Day of Examination:    I discussed the assessment and treatment plan with the patient. The patient was provided an opportunity to ask questions and all were answered. The patient agreed with the plan and demonstrated an understanding of the instructions.   The patient was advised to call back or seek an in-person evaluation if the symptoms worsen or if the condition fails to improve as anticipated.  I provided 27 minutes of non-face-to-face time during this encounter.   Follow up:  Patient Instructions  Prednisone 10 mg pill >> 4 pills daily for 3 days, 3 pills daily for 3 days, 2 pills daily for 3 days, 1 pill daily for 3 days  Will arrange for chest xray and call with results  Monitor your oxygen level and temperature at home  Call the office or go to the hospital if your symptoms get worse   Medication List:   Allergies as of 01/11/2021      Reactions   Bee Venom Swelling   Facial and sit of sting swelling Has EPi-pen   Ciprofloxacin Other (See Comments)   Neck stiffness and pains   Ivp Dye [iodinated Diagnostic Agents] Hives, Other (See Comments)   Pt is allergic to contrast material injected by an opthamologist, not contrast used by radiology///a.calhoun Benadryl works for prophylaxis   Shellfish Allergy    Per patient's allergist to avoid shellfish due to allergy to contrast dye.       Medication List       Accurate as of January 11, 2021 12:38 PM. If you have any questions, ask your nurse or doctor.        STOP taking these medications   meloxicam 15 MG tablet Commonly known as: MOBIC Stopped by: Coralyn Helling, MD   tamsulosin 0.4 MG Caps capsule Commonly known as: FLOMAX Stopped by: Coralyn Helling, MD     TAKE these medications    acetaminophen 500 MG tablet Commonly known as: TYLENOL Take 500 mg by mouth every 6 (six) hours as needed for moderate pain.   albuterol 108 (90 Base) MCG/ACT inhaler Commonly known as: ProAir HFA Inhale 2 puffs into the lungs every 6 (six) hours as needed for wheezing or shortness of breath.   amLODipine 10 MG tablet Commonly known as: NORVASC Take 10 mg by mouth daily.   azelastine 0.1 % nasal spray Commonly known as: ASTELIN Place 2 sprays into both nostrils 2 (two) times daily.   cetirizine 10 MG tablet Commonly known as: ZYRTEC Take 10 mg by mouth daily. As needed   EPINEPHrine 0.3 mg/0.3 mL Soaj injection Commonly known as: EPI-PEN Inject 0.3 mLs (0.3 mg total) into the muscle once. What changed:   when to take this  reasons to take this   linaclotide 145 MCG Caps capsule Commonly  known as: LINZESS Take 145 mcg by mouth daily as needed.   losartan 100 MG tablet Commonly known as: COZAAR Take 100 mg by mouth daily.   minocycline 100 MG tablet Commonly known as: DYNACIN Take 100 mg by mouth 2 (two) times daily.   montelukast 10 MG tablet Commonly known as: SINGULAIR TAKE 1 TABLET BY MOUTH EVERYDAY AT BEDTIME   predniSONE 10 MG tablet Commonly known as: DELTASONE Take 4 tablets (40 mg total) by mouth daily with breakfast for 3 days, THEN 3 tablets (30 mg total) daily with breakfast for 3 days, THEN 2 tablets (20 mg total) daily with breakfast for 3 days, THEN 1 tablet (10 mg total) daily with breakfast for 3 days. Start taking on: January 11, 2021 Started by: Coralyn Helling, MD   rosuvastatin 20 MG tablet Commonly known as: CRESTOR Take 20 mg by mouth daily.   Trelegy Ellipta 100-62.5-25 MCG/INH Aepb Generic drug: Fluticasone-Umeclidin-Vilant INHALE 1 PUFF BY MOUTH EVERY DAY       Signature:  Coralyn Helling, MD [facility] Pager - [phone] 01/11/2021, 12:38 PM

## 2021-01-11 NOTE — Patient Instructions (Signed)
Prednisone 10 mg pill >> 4 pills daily for 3 days, 3 pills daily for 3 days, 2 pills daily for 3 days, 1 pill daily for 3 days  Will arrange for chest xray and call with results  Monitor your oxygen level and temperature at home  Call the office or go to the hospital if your symptoms get worse

## 2021-01-21 ENCOUNTER — Telehealth: Payer: Self-pay | Admitting: Pulmonary Disease

## 2021-01-21 DIAGNOSIS — J449 Chronic obstructive pulmonary disease, unspecified: Secondary | ICD-10-CM

## 2021-01-21 DIAGNOSIS — U071 COVID-19: Secondary | ICD-10-CM

## 2021-01-21 NOTE — Telephone Encounter (Signed)
Spoke with the pt  I provided him with phone number to call th [facility]- [phone] He will call and make appt and cxr can be done  Nothing further needed

## 2021-01-21 NOTE — Telephone Encounter (Signed)
Spoke with the pt  He states he was dx with Covid 19 01/06/21- rapid test and then PCR pos on 01/15/21- had vax x 2 and booster He had televisit with Dr Craige Cotta on 01/11/21  He states since then he has continued to have SOB- comes and goes, has difficulty taking a full, deep breath  He states has aching in his ribs bilaterally and back pain that "goes all over, sometimes between my shoulders" He is not coughing much, and is producing minimal clear sputum when he does  He states not having any wheezing, f/c/s He wants to come in for cxr to make sure everything is okay  Looks like this was mentioned at recent televisit but nothing was ordered  He almost done with pred taper  He has been monitoring temp and o2 and these have been normal  Please advise, thank you!   Instructions    Return in about 6 months (around 07/11/2021). Prednisone 10 mg pill >> 4 pills daily for 3 days, 3 pills daily for 3 days, 2 pills daily for 3 days, 1 pill daily for 3 days   Will arrange for chest xray and call with results   Monitor your oxygen level and temperature at home   Call the office or go to the hospital if your symptoms get worse

## 2021-01-21 NOTE — Telephone Encounter (Signed)
Spoke with the pt  He states that the covid clinic told them that they only do cxr at [facility]  Pt states prefers to come in to our office after the 10 days post covid for ov with cxr  I have scheduled him with Beth for 01/25/21 at 10 am  cxr ordered

## 2021-01-21 NOTE — Telephone Encounter (Signed)
01/21/2021  Unfortunately I would base office return policy based off of PCR testing.  Meaning that we cannot see him in our office 10 days post 01/15/2021.  We will defer to Dr. Craige Cotta on location of chest x-ray.  If patient feels the symptoms are acutely worsening then he can present to an urgent care.  We will route to Dr. Craige Cotta who is in [place] today.  Elisha Headland, FNP

## 2021-01-21 NOTE — Telephone Encounter (Signed)
Can he get chest xray set up through [facility] COVID clinic?

## 2021-01-25 ENCOUNTER — Telehealth: Payer: Self-pay | Admitting: Primary Care

## 2021-01-25 ENCOUNTER — Ambulatory Visit (INDEPENDENT_AMBULATORY_CARE_PROVIDER_SITE_OTHER): Payer: Medicare Other

## 2021-01-25 ENCOUNTER — Ambulatory Visit (INDEPENDENT_AMBULATORY_CARE_PROVIDER_SITE_OTHER): Payer: Medicare Other | Admitting: Primary Care

## 2021-01-25 ENCOUNTER — Encounter: Payer: Self-pay | Admitting: Primary Care

## 2021-01-25 ENCOUNTER — Other Ambulatory Visit: Payer: Self-pay

## 2021-01-25 ENCOUNTER — Other Ambulatory Visit (INDEPENDENT_AMBULATORY_CARE_PROVIDER_SITE_OTHER): Payer: Medicare Other

## 2021-01-25 VITALS — BP 140/82 | HR 74 | Temp 97.2°F | Ht 71.0 in | Wt 250.0 lb

## 2021-01-25 DIAGNOSIS — J441 Chronic obstructive pulmonary disease with (acute) exacerbation: Secondary | ICD-10-CM | POA: Diagnosis not present

## 2021-01-25 DIAGNOSIS — R0602 Shortness of breath: Secondary | ICD-10-CM

## 2021-01-25 DIAGNOSIS — J449 Chronic obstructive pulmonary disease, unspecified: Secondary | ICD-10-CM

## 2021-01-25 DIAGNOSIS — U071 COVID-19: Secondary | ICD-10-CM

## 2021-01-25 DIAGNOSIS — J4489 Other specified chronic obstructive pulmonary disease: Secondary | ICD-10-CM

## 2021-01-25 LAB — CBC WITH DIFFERENTIAL/PLATELET
Basophils Absolute: 0.1 10*3/uL (ref 0.0–0.1)
Basophils Relative: 0.8 % (ref 0.0–3.0)
Eosinophils Absolute: 0.1 10*3/uL (ref 0.0–0.7)
Eosinophils Relative: 1.4 % (ref 0.0–5.0)
HCT: 44.8 % (ref 39.0–52.0)
Hemoglobin: 14.9 g/dL (ref 13.0–17.0)
Lymphocytes Relative: 20 % (ref 12.0–46.0)
Lymphs Abs: 1.8 10*3/uL (ref 0.7–4.0)
MCHC: 33.4 g/dL (ref 30.0–36.0)
MCV: 89.4 fl (ref 78.0–100.0)
Monocytes Absolute: 0.9 10*3/uL (ref 0.1–1.0)
Monocytes Relative: 10 % (ref 3.0–12.0)
Neutro Abs: 6 10*3/uL (ref 1.4–7.7)
Neutrophils Relative %: 67.8 % (ref 43.0–77.0)
Platelets: 227 10*3/uL (ref 150.0–400.0)
RBC: 5.01 Mil/uL (ref 4.22–5.81)
RDW: 14.5 % (ref 11.5–15.5)
WBC: 8.8 10*3/uL (ref 4.0–10.5)

## 2021-01-25 LAB — BASIC METABOLIC PANEL
BUN: 17 mg/dL (ref 6–23)
CO2: 30 mEq/L (ref 19–32)
Calcium: 9.8 mg/dL (ref 8.4–10.5)
Chloride: 101 mEq/L (ref 96–112)
Creatinine, Ser: 1.15 mg/dL (ref 0.40–1.50)
GFR: 64.56 mL/min (ref >60.00)
Glucose, Bld: 95 mg/dL (ref 70–99)
Potassium: 4.2 mEq/L (ref 3.5–5.1)
Sodium: 138 mEq/L (ref 135–145)

## 2021-01-25 MED ORDER — DM-GUAIFENESIN ER 30-600 MG PO TB12: 1.0000 | ORAL_TABLET | Freq: Two times a day (BID) | ORAL | 0 refills | Status: DC

## 2021-01-25 MED ORDER — TRELEGY ELLIPTA 200-62.5-25 MCG/INH IN AEPB
1.0000 | INHALATION_SPRAY | Freq: Every day | RESPIRATORY_TRACT | 0 refills | Status: DC
Start: 2021-01-25 — End: 2021-02-21

## 2021-01-25 NOTE — Progress Notes (Signed)
@Patient  ID: , male    DOB: July 10, 1951, 70 y.o.   MRN: [id number]  Chief Complaint  Patient presents with  . Follow-up    Increased back pain and chest tightness     Referring provider: [id number], MD  HPI: 70 year old male, former smoker quit in March 2020 (80 pack year hx). PMH significant for COPD/asthma overlap, covid-19. Patient of Dr. April 2020, last seen on 01/11/21 for televisit positive covid.   01/25/2021 Patient presents today for follow-up covid. Patient tested positive for covid on 01/11/21. His symptoms started 7 days prior to positive results so unfortunate he was not a candidate for monoclonal antibody therapy. He was felt to have a mild COPD exacerbation at the same time and was given prednisone taper. He was also ordered for CXR which he will be getting today. He reports experiencing chest tightness and back pain with deep breath. He has a productive cough in the morning. He is compliant with Trelegy 100 and prn albuterol does help. Prior to getting covid his breathing was well controlled. He finished prednisone taper, he reports no significant change in his symptoms after completing.    Allergies  Allergen Reactions  . Bee Venom Swelling    Facial and sit of sting swelling Has EPi-pen  . Ciprofloxacin Other (See Comments)    Neck stiffness and pains  . Ivp Dye [Iodinated Diagnostic Agents] Hives and Other (See Comments)    Pt is allergic to contrast material injected by an opthamologist, not contrast used by radiology///a.calhoun Benadryl works for prophylaxis  . Shellfish Allergy     Per patient's allergist to avoid shellfish due to allergy to contrast dye.     Immunization History  Administered Date(s) Administered  . Fluad Quad(high Dose 65+) 08/16/2020  . Influenza Split 09/22/2011, 09/24/2015, 09/30/2017  . Influenza Whole 10/22/2012  . Influenza, High Dose Seasonal PF 10/01/2018  . Influenza,inj,Quad PF,6+ Mos 09/21/2014  . Influenza-Unspecified  08/31/2019  . PFIZER(Purple Top)SARS-COV-2 Vaccination 01/14/2020, 02/04/2020, 10/06/2020  . Pneumococcal Polysaccharide-23 12/23/2007    Past Medical History:  Diagnosis Date  . Anxiety   . BPH (benign prostatic hyperplasia)   . COPD (chronic obstructive pulmonary disease) (HCC)   . 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 Foley catheter in place    02-17-18, occ clots in bag  . High cholesterol   . Hypertension   . Prostate enlargement   . Shoulder pain    right chronic  . Urinary retention     august  2018  . Wet senile macular degeneration (HCC)     right eye current tx, left eye tx in past    Tobacco History: Social History   Tobacco Use  Smoking Status Former Smoker  . Packs/day: 1.50  . Years: 53.00  . Pack years: 79.50  . Quit date: 03/04/2019  . Years since quitting: 1.8  Smokeless Tobacco Never Used   Counseling given: Not Answered   Outpatient Medications Prior to Visit  Medication Sig Dispense Refill  . acetaminophen (TYLENOL) 500 MG tablet Take 500 mg by mouth every 6 (six) hours as needed for moderate pain.    03/06/2019 albuterol (PROAIR HFA) 108 (90 Base) MCG/ACT inhaler Inhale 2 puffs into the lungs every 6 (six) hours as needed for wheezing or shortness of breath. 18 g 2  . amLODipine (NORVASC) 10 MG tablet Take 10 mg by mouth daily.     Marland Kitchen EPINEPHrine 0.3 mg/0.3 mL IJ SOAJ injection Inject 0.3 mLs (0.3 mg  total) into the muscle once. (Patient taking differently: Inject 0.3 mg into the muscle as needed for anaphylaxis.) 2 Device 1  . linaclotide (LINZESS) 145 MCG CAPS capsule Take 145 mcg by mouth daily as needed.    Marland Kitchen losartan (COZAAR) 100 MG tablet Take 100 mg by mouth daily.    . minocycline (DYNACIN) 100 MG tablet Take 100 mg by mouth 2 (two) times daily.    . montelukast (SINGULAIR) 10 MG tablet TAKE 1 TABLET BY MOUTH EVERYDAY AT BEDTIME 90 tablet 3  . rosuvastatin (CRESTOR) 20 MG tablet Take 20 mg by mouth daily.     . TRELEGY ELLIPTA 100-62.5-25 MCG/INH AEPB  INHALE 1 PUFF BY MOUTH EVERY DAY 60 each 5  . azelastine (ASTELIN) 0.1 % nasal spray Place 2 sprays into both nostrils 2 (two) times daily. (Patient not taking: Reported on 01/25/2021) 30 mL 5  . cetirizine (ZYRTEC) 10 MG tablet Take 10 mg by mouth daily. As needed (Patient not taking: Reported on 01/25/2021)     No facility-administered medications prior to visit.   Review of Systems  Review of Systems  Constitutional: Negative.   Respiratory: Positive for cough and chest tightness. Negative for wheezing.   Cardiovascular: Negative.    Physical Exam  BP 140/82 (BP Location: Left Arm, Cuff Size: Normal)   Pulse 74   Temp (!) 97.2 F (36.2 C)   Ht 5\' 11"  (1.803 m)   Wt 250 lb (113.4 kg)   SpO2 98%   BMI 34.87 kg/m  Physical Exam Constitutional:      Appearance: Normal appearance.  HENT:     Mouth/Throat:     Comments: Deferred d/t masking Cardiovascular:     Rate and Rhythm: Normal rate and regular rhythm.  Pulmonary:     Effort: Pulmonary effort is normal.     Breath sounds: Normal breath sounds.     Comments: CTA Musculoskeletal:        General: Normal range of motion.  Skin:    General: Skin is warm and dry.  Neurological:     General: No focal deficit present.     Mental Status: He is alert and oriented to person, place, and time. Mental status is at baseline.  Psychiatric:        Mood and Affect: Mood normal.        Behavior: Behavior normal.        Thought Content: Thought content normal.        Judgment: Judgment normal.      Lab Results:  CBC    Component Value Date/Time   WBC 8.8 01/25/2021 1047   RBC 5.01 01/25/2021 1047   HGB 14.9 01/25/2021 1047   HCT 44.8 01/25/2021 1047   PLT 227.0 01/25/2021 1047   MCV 89.4 01/25/2021 1047   MCH 29.7 03/21/2019 0229   MCHC 33.4 01/25/2021 1047   RDW 14.5 01/25/2021 1047   LYMPHSABS 1.8 01/25/2021 1047   MONOABS 0.9 01/25/2021 1047   EOSABS 0.1 01/25/2021 1047   BASOSABS 0.1 01/25/2021 1047    BMET     Component Value Date/Time   NA 138 01/25/2021 1047   K 4.2 01/25/2021 1047   CL 101 01/25/2021 1047   CO2 30 01/25/2021 1047   GLUCOSE 95 01/25/2021 1047   BUN 17 01/25/2021 1047   CREATININE 1.15 01/25/2021 1047   CALCIUM 9.8 01/25/2021 1047   GFRNONAA >60 03/21/2019 0229   GFRAA >60 03/21/2019 0229    BNP    Component  Value Date/Time   BNP 17.2 03/21/2019 0229    ProBNP    Component Value Date/Time   PROBNP 15.0 05/20/2019 1137    Imaging: DG Chest 2 View  Result Date: 01/25/2021 CLINICAL DATA:  Post COVID, asthma. EXAM: CHEST - 2 VIEW COMPARISON:  05/20/2019 and CT chest 11/29/2020. FINDINGS: Trachea is midline. Heart size normal. Lungs may be minimally hyperinflated but are clear. No pleural fluid. Degenerative changes in the spine. IMPRESSION: No acute findings. Electronically Signed   By: Leanna Battles M.D.   On: 01/25/2021 10:07     Assessment & Plan:   COVID-19 virus infection - Patient tested positive for covid on 01/11/21. Unable to get monoclonal antibody infusion. Residual shortness of breath, chest tightness and back pain with deep breath.Lungs were clear on exam and O2 98% RA. CXR today showed no acute cardiopulmonary findings, hyperinflated clear lungs. Checking D-dimer and basic labs.   COPD with asthma - AECOPD secondary to covid infection. Completed prednisone taper with no clear benefit. Recommend trial Trelegy 200 x two weeks, then resume regular strength. Instructed patient to use Albuterol rescue inhaler two puffs every 6 hours for breakthrough shortness of breath/wheezing and take regular mucinex 600mg  twice a day with glass of water. He may benefit from rehab program to help with recovery.   , NP 01/25/2021

## 2021-01-25 NOTE — Assessment & Plan Note (Addendum)
-   Patient tested positive for covid on 01/11/21. Unable to get monoclonal antibody infusion. Residual shortness of breath, chest tightness and back pain with deep breath.Lungs were clear on exam and O2 98% RA. CXR today showed no acute cardiopulmonary findings, hyperinflated clear lungs. Checking D-dimer and basic labs.

## 2021-01-25 NOTE — Patient Instructions (Signed)
Nice seeing you today Tim Thomas  CXR today showed clear lungs, hyperinflated likely d/t underlying COPD  Recommendations: - Use sample Trelegy 200 x 2 weeks until complete than resume regular prescription  - Use Albuterol rescue inhaler two puffs every 6 hours for breakthrough shortness of breath/wheezing  - Take regular mucinex twice a day with glass of water - Practice deep breathing exercises   Orders: - Labs (d-dimer, CBC, BMET)  Follow-up: - 3 months with Dr. Craige Cotta or sooner if needed

## 2021-01-25 NOTE — Assessment & Plan Note (Addendum)
-   AECOPD secondary to covid infection. Completed prednisone taper with no clear benefit. Recommend trial Trelegy 200 x two weeks, then resume regular strength. Instructed patient to use Albuterol rescue inhaler two puffs every 6 hours for breakthrough shortness of breath/wheezing and take regular mucinex 600mg  twice a day with glass of water. He may benefit from rehab program to help with recovery.

## 2021-01-25 NOTE — Telephone Encounter (Signed)
Spoke with patient. He stated that he was seen by [facility] today and forgot to ask her a question. He wants to know if he would be a good candidate for pulmonary rehab. He has been dealing with COPD for years and now covid, he wants to make sure he is doing all he can do to protect his lungs. I advised him that I would send a message over to [facility] for him. He verbalized understanding.   He did confirm that he lives in the [place] area and would prefer to go to [facility] if possible.   Beth, can you please advise? Thanks!

## 2021-01-25 NOTE — Telephone Encounter (Signed)
Yes we can refer him to pulmonary rehab, where ever is best for him. Thanks

## 2021-01-25 NOTE — Telephone Encounter (Signed)
Spoke with patient. He is aware that Waynetta Sandy is ok with the referral. Will place the referral under VS' name.   Nothing further needed at time of call.

## 2021-01-26 LAB — D-DIMER, QUANTITATIVE: D-Dimer, Quant: 0.54 mcg/mL FEU — ABNORMAL HIGH (ref <0.50)

## 2021-01-28 NOTE — Progress Notes (Signed)
Please let patient know his labs were normal. D-dimer was essentially normal as well, I do not suspect PE at this time. If pain does not improved, O2 level drops below 90 or HR >120 please present to ED.

## 2021-01-29 ENCOUNTER — Telehealth: Payer: Self-pay | Admitting: Primary Care

## 2021-01-29 NOTE — Telephone Encounter (Signed)
Glenford Bayley, NP  Beda Dula, Farley Ly, CMA Please let patient know his labs were normal. D-dimer was essentially normal as well, I do not suspect PE at this time. If pain does not improved, O2 level drops below 90 or HR >120 please present to ED.    Called and spoke with pt letting him know the results of labwork per [facility]. Pt verbalized understanding. Nothing further needed.

## 2021-02-05 ENCOUNTER — Encounter (INDEPENDENT_AMBULATORY_CARE_PROVIDER_SITE_OTHER): Payer: Medicare Other | Admitting: Ophthalmology

## 2021-02-05 ENCOUNTER — Other Ambulatory Visit: Payer: Self-pay

## 2021-02-05 DIAGNOSIS — H43813 Vitreous degeneration, bilateral: Secondary | ICD-10-CM | POA: Diagnosis not present

## 2021-02-05 DIAGNOSIS — H35033 Hypertensive retinopathy, bilateral: Secondary | ICD-10-CM

## 2021-02-05 DIAGNOSIS — I10 Essential (primary) hypertension: Secondary | ICD-10-CM

## 2021-02-05 DIAGNOSIS — H353231 Exudative age-related macular degeneration, bilateral, with active choroidal neovascularization: Secondary | ICD-10-CM

## 2021-02-06 ENCOUNTER — Encounter (INDEPENDENT_AMBULATORY_CARE_PROVIDER_SITE_OTHER): Payer: Medicare Other | Admitting: Ophthalmology

## 2021-02-12 ENCOUNTER — Other Ambulatory Visit: Payer: Self-pay

## 2021-02-12 ENCOUNTER — Encounter: Payer: Medicare Other | Attending: Pulmonary Disease

## 2021-02-12 DIAGNOSIS — J449 Chronic obstructive pulmonary disease, unspecified: Secondary | ICD-10-CM

## 2021-02-12 NOTE — Progress Notes (Signed)
Virtual Visit completed. Patient informed on EP and RD appointment and 6 Minute walk test. Patient also informed of patient health questionnaires on My Chart. Patient Verbalizes understanding. Visit diagnosis can be found in [facility] 01/25/2021.

## 2021-02-19 ENCOUNTER — Other Ambulatory Visit: Payer: Self-pay

## 2021-02-19 ENCOUNTER — Encounter: Payer: Medicare Other | Attending: Pulmonary Disease | Admitting: *Deleted

## 2021-02-19 VITALS — Ht 72.5 in | Wt 251.4 lb

## 2021-02-19 DIAGNOSIS — J449 Chronic obstructive pulmonary disease, unspecified: Secondary | ICD-10-CM | POA: Diagnosis present

## 2021-02-19 DIAGNOSIS — Z79899 Other long term (current) drug therapy: Secondary | ICD-10-CM | POA: Diagnosis not present

## 2021-02-19 DIAGNOSIS — J45909 Unspecified asthma, uncomplicated: Secondary | ICD-10-CM | POA: Insufficient documentation

## 2021-02-19 DIAGNOSIS — Z87891 Personal history of nicotine dependence: Secondary | ICD-10-CM | POA: Diagnosis not present

## 2021-02-19 NOTE — Patient Instructions (Signed)
Patient Instructions  Patient Details  Name: Tim Thomas MRN: [id number] Date of Birth: January 02, 1951 Referring Provider:  Coralyn Helling, MD  Below are your personal goals for exercise, nutrition, and risk factors. Our goal is to help you stay on track towards obtaining and maintaining these goals. We will be discussing your progress on these goals with you throughout the program.  Initial Exercise Prescription:  Initial Exercise Prescription - 02/19/21 1100      Date of Initial Exercise RX and Referring Provider   Date 02/19/21    Referring Provider Daiva Huge MD      Treadmill   MPH 2.2    Grade 1    Minutes 15    METs 2.84      Recumbant Elliptical   Level 1    RPM 50    Minutes 15    METs 2      REL-XR   Level 2    Speed 50    Minutes 15    METs 2      Prescription Details   Frequency (times per week) 3    Duration Progress to 30 minutes of continuous aerobic without signs/symptoms of physical distress      Intensity   THRR 40-80% of Max Heartrate 105-135    Ratings of Perceived Exertion 11-13    Perceived Dyspnea 0-4      Progression   Progression Continue to progress workloads to maintain intensity without signs/symptoms of physical distress.      Resistance Training   Training Prescription Yes    Weight 5 lb    Reps 10-15           Exercise Goals: Frequency: Be able to perform aerobic exercise two to three times per week in program working toward 2-5 days per week of home exercise.  Intensity: Work with a perceived exertion of 11 (fairly light) - 15 (hard) while following your exercise prescription.  We will make changes to your prescription with you as you progress through the program.   Duration: Be able to do 30 to 45 minutes of continuous aerobic exercise in addition to a 5 minute warm-up and a 5 minute cool-down routine.   Nutrition Goals: Your personal nutrition goals will be established when you do your nutrition analysis with the  dietician.  The following are general nutrition guidelines to follow: Cholesterol < 200mg /day Sodium < 1500mg /day Fiber: Men over 50 yrs - 30 grams per day  Personal Goals:  Personal Goals and Risk Factors at Admission - 02/19/21 1142      Core Components/Risk Factors/Patient Goals on Admission    Weight Management Yes;Weight Loss;Obesity    Intervention Weight Management: Develop a combined nutrition and exercise program designed to reach desired caloric intake, while maintaining appropriate intake of nutrient and fiber, sodium and fats, and appropriate energy expenditure required for the weight goal.;Weight Management/Obesity: Establish reasonable short term and long term weight goals.;Obesity: Provide education and appropriate resources to help participant work on and attain dietary goals.;Weight Management: Provide education and appropriate resources to help participant work on and attain dietary goals.    Admit Weight 251 lb 6.4 oz (114 kg)    Goal Weight: Short Term 246 lb (111.6 kg)    Goal Weight: Long Term 240 lb (108.9 kg)    Expected Outcomes Short Term: Continue to assess and modify interventions until short term weight is achieved;Long Term: Adherence to nutrition and physical activity/exercise program aimed toward attainment of established weight goal;Weight Loss: Understanding  of general recommendations for a balanced deficit meal plan, which promotes 1-2 lb weight loss per week and includes a negative energy balance of [phone] kcal/d;Understanding recommendations for meals to include 15-35% energy as protein, 25-35% energy from fat, 35-60% energy from carbohydrates, less than 200mg  of dietary cholesterol, 20-35 gm of total fiber daily;Understanding of distribution of calorie intake throughout the day with the consumption of 4-5 meals/snacks    Improve shortness of breath with ADL's Yes    Intervention Provide education, individualized exercise plan and daily activity instruction to  help decrease symptoms of SOB with activities of daily living.    Expected Outcomes Long Term: Be able to perform more ADLs without symptoms or delay the onset of symptoms;Short Term: Improve cardiorespiratory fitness to achieve a reduction of symptoms when performing ADLs    Hypertension Yes    Intervention Provide education on lifestyle modifcations including regular physical activity/exercise, weight management, moderate sodium restriction and increased consumption of fresh fruit, vegetables, and low fat dairy, alcohol moderation, and smoking cessation.;Monitor prescription use compliance.    Expected Outcomes Short Term: Continued assessment and intervention until BP is < 140/52mm HG in hypertensive participants. < 130/32mm HG in hypertensive participants with diabetes, heart failure or chronic kidney disease.;Long Term: Maintenance of blood pressure at goal levels.    Lipids Yes    Intervention Provide education and support for participant on nutrition & aerobic/resistive exercise along with prescribed medications to achieve LDL 70mg , HDL >40mg .    Expected Outcomes Short Term: Participant states understanding of desired cholesterol values and is compliant with medications prescribed. Participant is following exercise prescription and nutrition guidelines.;Long Term: Cholesterol controlled with medications as prescribed, with individualized exercise RX and with personalized nutrition plan. Value goals: LDL < 70mg , HDL > 40 mg.           Tobacco Use Initial Evaluation: Social History   Tobacco Use  Smoking Status Former Smoker  . Packs/day: 1.50  . Years: 53.00  . Pack years: 79.50  . Types: Cigarettes  . Quit date: 03/04/2019  . Years since quitting: 1.9  Smokeless Tobacco Never Used    Exercise Goals and Review:  Exercise Goals    Row Name 02/19/21 1140             Exercise Goals   Increase Physical Activity Yes       Intervention Provide advice, education, support and  counseling about physical activity/exercise needs.;Develop an individualized exercise prescription for aerobic and resistive training based on initial evaluation findings, risk stratification, comorbidities and participant's personal goals.       Expected Outcomes Short Term: Attend rehab on a regular basis to increase amount of physical activity.;Long Term: Add in home exercise to make exercise part of routine and to increase amount of physical activity.;Long Term: Exercising regularly at least 3-5 days a week.       Increase Strength and Stamina Yes       Intervention Provide advice, education, support and counseling about physical activity/exercise needs.;Develop an individualized exercise prescription for aerobic and resistive training based on initial evaluation findings, risk stratification, comorbidities and participant's personal goals.       Expected Outcomes Short Term: Increase workloads from initial exercise prescription for resistance, speed, and METs.;Short Term: Perform resistance training exercises routinely during rehab and add in resistance training at home;Long Term: Improve cardiorespiratory fitness, muscular endurance and strength as measured by increased METs and functional capacity (03/06/2019)       Able to understand and  use rate of perceived exertion (RPE) scale Yes       Intervention Provide education and explanation on how to use RPE scale       Expected Outcomes Short Term: Able to use RPE daily in rehab to express subjective intensity level;Long Term:  Able to use RPE to guide intensity level when exercising independently       Able to understand and use Dyspnea scale Yes       Intervention Provide education and explanation on how to use Dyspnea scale       Expected Outcomes Short Term: Able to use Dyspnea scale daily in rehab to express subjective sense of shortness of breath during exertion;Long Term: Able to use Dyspnea scale to guide intensity level when exercising independently        Knowledge and understanding of Target Heart Rate Range (THRR) Yes       Intervention Provide education and explanation of THRR including how the numbers were predicted and where they are located for reference       Expected Outcomes Short Term: Able to state/look up THRR;Short Term: Able to use daily as guideline for intensity in rehab;Long Term: Able to use THRR to govern intensity when exercising independently       Able to check pulse independently Yes       Intervention Provide education and demonstration on how to check pulse in carotid and radial arteries.;Review the importance of being able to check your own pulse for safety during independent exercise       Expected Outcomes Short Term: Able to explain why pulse checking is important during independent exercise;Long Term: Able to check pulse independently and accurately       Understanding of Exercise Prescription Yes       Intervention Provide education, explanation, and written materials on patient's individual exercise prescription       Expected Outcomes Short Term: Able to explain program exercise prescription;Long Term: Able to explain home exercise prescription to exercise independently              Copy of goals given to participant.

## 2021-02-19 NOTE — Progress Notes (Signed)
Pulmonary Individual Treatment Plan  Patient Details  Name: Tim Thomas MRN: [id number] Date of Birth: [birth year] Referring Provider:   Flowsheet Row Pulmonary Rehab from 02/19/2021 in [facility] Cardiac and Pulmonary Rehab  Referring Provider Daiva Huge MD      Initial Encounter Date:  Flowsheet Row Pulmonary Rehab from 02/19/2021 in [facility] Cardiac and Pulmonary Rehab  Date 02/19/21      Visit Diagnosis: COPD with asthma (HCC)  Patient's Home Medications on Admission:  Current Outpatient Medications:  .  acetaminophen (TYLENOL) 500 MG tablet, Take 500 mg by mouth every 6 (six) hours as needed for moderate pain., Disp: , Rfl:  .  albuterol (PROAIR HFA) 108 (90 Base) MCG/ACT inhaler, Inhale 2 puffs into the lungs every 6 (six) hours as needed for wheezing or shortness of breath., Disp: 18 g, Rfl: 2 .  amLODipine (NORVASC) 10 MG tablet, Take 10 mg by mouth daily. , Disp: , Rfl:  .  azelastine (ASTELIN) 0.1 % nasal spray, Place 2 sprays into both nostrils 2 (two) times daily. (Patient not taking: No sig reported), Disp: 30 mL, Rfl: 5 .  cetirizine (ZYRTEC) 10 MG tablet, Take 10 mg by mouth daily. As needed (Patient not taking: No sig reported), Disp: , Rfl:  .  dextromethorphan-guaiFENesin (MUCINEX DM) 30-600 MG 12hr tablet, Take 1 tablet by mouth 2 (two) times daily., Disp: 2 tablet, Rfl: 0 .  EPINEPHrine 0.3 mg/0.3 mL IJ SOAJ injection, Inject 0.3 mLs (0.3 mg total) into the muscle once. (Patient taking differently: Inject 0.3 mg into the muscle as needed for anaphylaxis.), Disp: 2 Device, Rfl: 1 .  Fluticasone-Umeclidin-Vilant (TRELEGY ELLIPTA) 200-62.5-25 MCG/INH AEPB, Inhale 1 puff into the lungs daily., Disp: 28 each, Rfl: 0 .  linaclotide (LINZESS) 145 MCG CAPS capsule, Take 145 mcg by mouth daily as needed., Disp: , Rfl:  .  losartan (COZAAR) 100 MG tablet, Take 100 mg by mouth daily., Disp: , Rfl:  .  minocycline (DYNACIN) 100 MG tablet, Take 100 mg by mouth 2 (two) times daily., Disp:  , Rfl:  .  Minocycline HCl 1 MG MISC, , Disp: , Rfl:  .  montelukast (SINGULAIR) 10 MG tablet, TAKE 1 TABLET BY MOUTH EVERYDAY AT BEDTIME, Disp: 90 tablet, Rfl: 3 .  NEVANAC 0.1 % ophthalmic suspension, SMARTSIG:In Eye(s), Disp: , Rfl:  .  rosuvastatin (CRESTOR) 20 MG tablet, Take 20 mg by mouth daily. , Disp: , Rfl:  .  sulfamethoxazole-trimethoprim (BACTRIM DS) 800-160 MG tablet, Take 1 tablet by mouth 2 (two) times daily. (Patient not taking: Reported on 02/12/2021), Disp: , Rfl:  .  TRELEGY ELLIPTA 100-62.5-25 MCG/INH AEPB, INHALE 1 PUFF BY MOUTH EVERY DAY, Disp: 60 each, Rfl: 5  Past Medical History: Past Medical History:  Diagnosis Date  . Anxiety   . BPH (benign prostatic hyperplasia)   . COPD (chronic obstructive pulmonary disease) (HCC)   . 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 Foley catheter in place    02-17-18, occ clots in bag  . High cholesterol   . Hypertension   . Prostate enlargement   . Shoulder pain    right chronic  . Urinary retention     august  2018  . Wet senile macular degeneration (HCC)     right eye current tx, left eye tx in past    Tobacco Use: Social History   Tobacco Use  Smoking Status Former Smoker  . Packs/day: 1.50  . Years: 53.00  . Pack years: 79.50  . Types: Cigarettes  .  Quit date: 03/04/2019  . Years since quitting: 1.9  Smokeless Tobacco Never Used    Labs: Recent Review Flowsheet Data   There is no flowsheet data to display.      Pulmonary Assessment Scores:  Pulmonary Assessment Scores    Row Name 02/19/21 1144         ADL UCSD   ADL Phase Entry     SOB Score total 61     Rest 3     Walk 3     Stairs 4     Bath 2     Dress 1     Shop 3           CAT Score   CAT Score 25           mMRC Score   mMRC Score 1            UCSD: Self-administered rating of dyspnea associated with activities of daily living (ADLs) 6-point scale (0 = "not at all" to 5 = "maximal or unable to do because of breathlessness")   Scoring Scores range from 0 to 120.  Minimally important difference is 5 units  CAT: CAT can identify the health impairment of COPD patients and is better correlated with disease progression.  CAT has a scoring range of zero to 40. The CAT score is classified into four groups of low (less than 10), medium (10 - 20), high (21-30) and very high (31-40) based on the impact level of disease on health status. A CAT score over 10 suggests significant symptoms.  A worsening CAT score could be explained by an exacerbation, poor medication adherence, poor inhaler technique, or progression of COPD or comorbid conditions.  CAT MCID is 2 points  mMRC: mMRC (Modified Medical Research Council) Dyspnea Scale is used to assess the degree of baseline functional disability in patients of respiratory disease due to dyspnea. No minimal important difference is established. A decrease in score of 1 point or greater is considered a positive change.   Pulmonary Function Assessment:  Pulmonary Function Assessment - 02/12/21 0938      Breath   Shortness of Breath Yes;Limiting activity           Exercise Target Goals: Exercise Program Goal: Individual exercise prescription set using results from initial 6 min walk test and THRR while considering  patient's activity barriers and safety.   Exercise Prescription Goal: Initial exercise prescription builds to 30-45 minutes a day of aerobic activity, 2-3 days per week.  Home exercise guidelines will be given to patient during program as part of exercise prescription that the participant will acknowledge.  Education: Aerobic Exercise: - Group verbal and visual presentation on the components of exercise prescription. Introduces F.I.T.T principle from ACSM for exercise prescriptions.  Reviews F.I.T.T. principles of aerobic exercise including progression. Written material given at graduation.   Education: Resistance Exercise: - Group verbal and visual presentation on  the components of exercise prescription. Introduces F.I.T.T principle from ACSM for exercise prescriptions  Reviews F.I.T.T. principles of resistance exercise including progression. Written material given at graduation.    Education: Exercise & Equipment Safety: - Individual verbal instruction and demonstration of equipment use and safety with use of the equipment. Flowsheet Row Pulmonary Rehab from 02/19/2021 in [facility] Cardiac and Pulmonary Rehab  Date 02/12/21  Educator [facility]  Instruction Review Code 1- Verbalizes Understanding      Education: Exercise Physiology & General Exercise Guidelines: - Group verbal and written instruction with models to review  the exercise physiology of the cardiovascular system and associated critical values. Provides general exercise guidelines with specific guidelines to those with heart or lung disease.    Education: Flexibility, Balance, Mind/Body Relaxation: - Group verbal and visual presentation with interactive activity on the components of exercise prescription. Introduces F.I.T.T principle from ACSM for exercise prescriptions. Reviews F.I.T.T. principles of flexibility and balance exercise training including progression. Also discusses the mind body connection.  Reviews various relaxation techniques to help reduce and manage stress (i.e. Deep breathing, progressive muscle relaxation, and visualization). Balance handout provided to take home. Written material given at graduation.   Activity Barriers & Risk Stratification:  Activity Barriers & Cardiac Risk Stratification - 02/19/21 1137      Activity Barriers & Cardiac Risk Stratification   Activity Barriers Neck/Spine Problems;Shortness of Breath;Muscular Weakness;Deconditioning;Balance Concerns;Other (comment)    Comments Hx vertigo           6 Minute Walk:  6 Minute Walk    Row Name 02/19/21 1135         6 Minute Walk   Phase Initial     Distance 1390 feet     Walk Time 6 minutes     # of Rest  Breaks 0     MPH 2.63     METS 2.83     RPE 11     Perceived Dyspnea  1     VO2 Peak 9.88     Symptoms Yes (comment)     Comments SOB, a little lightheaded at beginning but dissipated     Resting HR 75 bpm     Resting BP 134/60     Resting Oxygen Saturation  97 %     Exercise Oxygen Saturation  during 6 min walk 91 %     Max Ex. HR 108 bpm     Max Ex. BP 164/60     2 Minute Post BP 142/64           Interval HR   1 Minute HR 97     2 Minute HR 104     3 Minute HR 106     4 Minute HR 104     5 Minute HR 106     6 Minute HR 108     2 Minute Post HR 83     Interval Heart Rate? Yes           Interval Oxygen   Interval Oxygen? Yes     Baseline Oxygen Saturation % 97 %     1 Minute Oxygen Saturation % 92 %     1 Minute Liters of Oxygen 0 L  Room Air     2 Minute Oxygen Saturation % 92 %     2 Minute Liters of Oxygen 0 L     3 Minute Oxygen Saturation % 91 %     3 Minute Liters of Oxygen 0 L     4 Minute Oxygen Saturation % 93 %     4 Minute Liters of Oxygen 0 L     5 Minute Oxygen Saturation % 94 %     5 Minute Liters of Oxygen 0 L     6 Minute Oxygen Saturation % 93 %     6 Minute Liters of Oxygen 0 L     2 Minute Post Oxygen Saturation % 96 %     2 Minute Post Liters of Oxygen 0 L  Oxygen Initial Assessment:  Oxygen Initial Assessment - 02/12/21 0937      Home Oxygen   Home Oxygen Device None    Sleep Oxygen Prescription None    Home Exercise Oxygen Prescription None    Home Resting Oxygen Prescription None      Initial 6 min Walk   Oxygen Used None      Program Oxygen Prescription   Program Oxygen Prescription None      Intervention   Short Term Goals To learn and understand importance of monitoring SPO2 with pulse oximeter and demonstrate accurate use of the pulse oximeter.;To learn and understand importance of maintaining oxygen saturations>88%;To learn and demonstrate proper pursed lip breathing techniques or other breathing techniques.;To  learn and demonstrate proper use of respiratory medications    Long  Term Goals Verbalizes importance of monitoring SPO2 with pulse oximeter and return demonstration;Maintenance of O2 saturations>88%;Exhibits proper breathing techniques, such as pursed lip breathing or other method taught during program session;Compliance with respiratory medication;Demonstrates proper use of MDI's           Oxygen Re-Evaluation:   Oxygen Discharge (Final Oxygen Re-Evaluation):   Initial Exercise Prescription:  Initial Exercise Prescription - 02/19/21 1100      Date of Initial Exercise RX and Referring Provider   Date 02/19/21    Referring Provider Daiva HugeSood, Vincent MD      Treadmill   MPH 2.2    Grade 1    Minutes 15    METs 2.84      Recumbant Elliptical   Level 1    RPM 50    Minutes 15    METs 2      REL-XR   Level 2    Speed 50    Minutes 15    METs 2      Prescription Details   Frequency (times per week) 3    Duration Progress to 30 minutes of continuous aerobic without signs/symptoms of physical distress      Intensity   THRR 40-80% of Max Heartrate 105-135    Ratings of Perceived Exertion 11-13    Perceived Dyspnea 0-4      Progression   Progression Continue to progress workloads to maintain intensity without signs/symptoms of physical distress.      Resistance Training   Training Prescription Yes    Weight 5 lb    Reps 10-15           Perform Capillary Blood Glucose checks as needed.  Exercise Prescription Changes:  Exercise Prescription Changes    Row Name 02/19/21 1100             Response to Exercise   Blood Pressure (Admit) 134/60       Blood Pressure (Exercise) 164/60       Blood Pressure (Exit) 126/60       Heart Rate (Admit) 75 bpm       Heart Rate (Exercise) 108 bpm       Heart Rate (Exit) 88 bpm       Oxygen Saturation (Admit) 97 %       Oxygen Saturation (Exercise) 91 %       Oxygen Saturation (Exit) 96 %       Rating of Perceived Exertion  (Exercise) 11       Perceived Dyspnea (Exercise) 1       Symptoms SOB, lightheaded       Comments walk test results  Exercise Comments:   Exercise Goals and Review:  Exercise Goals    Row Name 02/19/21 1140             Exercise Goals   Increase Physical Activity Yes       Intervention Provide advice, education, support and counseling about physical activity/exercise needs.;Develop an individualized exercise prescription for aerobic and resistive training based on initial evaluation findings, risk stratification, comorbidities and participant's personal goals.       Expected Outcomes Short Term: Attend rehab on a regular basis to increase amount of physical activity.;Long Term: Add in home exercise to make exercise part of routine and to increase amount of physical activity.;Long Term: Exercising regularly at least 3-5 days a week.       Increase Strength and Stamina Yes       Intervention Provide advice, education, support and counseling about physical activity/exercise needs.;Develop an individualized exercise prescription for aerobic and resistive training based on initial evaluation findings, risk stratification, comorbidities and participant's personal goals.       Expected Outcomes Short Term: Increase workloads from initial exercise prescription for resistance, speed, and METs.;Short Term: Perform resistance training exercises routinely during rehab and add in resistance training at home;Long Term: Improve cardiorespiratory fitness, muscular endurance and strength as measured by increased METs and functional capacity ( )       Able to understand and use rate of perceived exertion (RPE) scale Yes       Intervention Provide education and explanation on how to use RPE scale       Expected Outcomes Short Term: Able to use RPE daily in rehab to express subjective intensity level;Long Term:  Able to use RPE to guide intensity level when exercising independently       Able  to understand and use Dyspnea scale Yes       Intervention Provide education and explanation on how to use Dyspnea scale       Expected Outcomes Short Term: Able to use Dyspnea scale daily in rehab to express subjective sense of shortness of breath during exertion;Long Term: Able to use Dyspnea scale to guide intensity level when exercising independently       Knowledge and understanding of Target Heart Rate Range (THRR) Yes       Intervention Provide education and explanation of THRR including how the numbers were predicted and where they are located for reference       Expected Outcomes Short Term: Able to state/look up THRR;Short Term: Able to use daily as guideline for intensity in rehab;Long Term: Able to use THRR to govern intensity when exercising independently       Able to check pulse independently Yes       Intervention Provide education and demonstration on how to check pulse in carotid and radial arteries.;Review the importance of being able to check your own pulse for safety during independent exercise       Expected Outcomes Short Term: Able to explain why pulse checking is important during independent exercise;Long Term: Able to check pulse independently and accurately       Understanding of Exercise Prescription Yes       Intervention Provide education, explanation, and written materials on patient's individual exercise prescription       Expected Outcomes Short Term: Able to explain program exercise prescription;Long Term: Able to explain home exercise prescription to exercise independently              Exercise Goals Re-Evaluation :  Discharge Exercise Prescription (Final Exercise Prescription Changes):  Exercise Prescription Changes - 02/19/21 1100      Response to Exercise   Blood Pressure (Admit) 134/60    Blood Pressure (Exercise) 164/60    Blood Pressure (Exit) 126/60    Heart Rate (Admit) 75 bpm    Heart Rate (Exercise) 108 bpm    Heart Rate (Exit) 88 bpm     Oxygen Saturation (Admit) 97 %    Oxygen Saturation (Exercise) 91 %    Oxygen Saturation (Exit) 96 %    Rating of Perceived Exertion (Exercise) 11    Perceived Dyspnea (Exercise) 1    Symptoms SOB, lightheaded    Comments walk test results           Nutrition:  Target Goals: Understanding of nutrition guidelines, daily intake of sodium 1500mg , cholesterol 200mg , calories 30% from fat and 7% or less from saturated fats, daily to have 5 or more servings of fruits and vegetables.  Education: All About Nutrition: -Group instruction provided by verbal, written material, interactive activities, discussions, models, and posters to present general guidelines for heart healthy nutrition including fat, fiber, MyPlate, the role of sodium in heart healthy nutrition, utilization of the nutrition label, and utilization of this knowledge for meal planning. Follow up email sent as well. Written material given at graduation.   Biometrics:  Pre Biometrics - 02/19/21 1140      Pre Biometrics   Height 6' 0.5" (1.842 m)    Weight 251 lb 6.4 oz (114 kg)    BMI (Calculated) 33.61    Single Leg Stand 5 seconds            Nutrition Therapy Plan and Nutrition Goals:   Nutrition Assessments:  MEDIFICTS Score Key:  ?70 Need to make dietary changes   40-70 Heart Healthy Diet  ? 40 Therapeutic Level Cholesterol Diet  Flowsheet Row Pulmonary Rehab from 02/19/2021 in [facility] Cardiac and Pulmonary Rehab  Picture Your Plate Total Score on Admission 44     Picture Your Plate Scores:  <11 Unhealthy dietary pattern with much room for improvement.  41-50 Dietary pattern unlikely to meet recommendations for good health and room for improvement.  51-60 More healthful dietary pattern, with some room for improvement.   >60 Healthy dietary pattern, although there may be some specific behaviors that could be improved.   Nutrition Goals Re-Evaluation:   Nutrition Goals Discharge (Final Nutrition Goals  Re-Evaluation):   Psychosocial: Target Goals: Acknowledge presence or absence of significant depression and/or stress, maximize coping skills, provide positive support system. Participant is able to verbalize types and ability to use techniques and skills needed for reducing stress and depression.   Education: Stress, Anxiety, and Depression - Group verbal and visual presentation to define topics covered.  Reviews how body is impacted by stress, anxiety, and depression.  Also discusses healthy ways to reduce stress and to treat/manage anxiety and depression.  Written material given at graduation.   Education: Sleep Hygiene -Provides group verbal and written instruction about how sleep can affect your health.  Define sleep hygiene, discuss sleep cycles and impact of sleep habits. Review good sleep hygiene tips.    Initial Review & Psychosocial Screening:  Initial Psych Review & Screening - 02/12/21 0941      Initial Review   Current issues with None Identified      Family Dynamics   Good Support System? Yes    Comments He can look to his wife of 50 years and  daughter. He has two other children who he talkes to.      Barriers   Psychosocial barriers to participate in program There are no identifiable barriers or psychosocial needs.;The patient should benefit from training in stress management and relaxation.      Screening Interventions   Interventions Encouraged to exercise;To provide support and resources with identified psychosocial needs;Provide feedback about the scores to participant           Quality of Life Scores:  Scores of 19 and below usually indicate a poorer quality of life in these areas.  A difference of  2-3 points is a clinically meaningful difference.  A difference of 2-3 points in the total score of the Quality of Life Index has been associated with significant improvement in overall quality of life, self-image, physical symptoms, and general health in studies  assessing change in quality of life.  PHQ-9: Recent Review Flowsheet Data    Depression screen [facility] 2/9 02/19/2021   Decreased Interest 2   Down, Depressed, Hopeless 0   PHQ - 2 Score 2   Altered sleeping 2   Tired, decreased energy 2   Change in appetite 3   Feeling bad or failure about yourself  0   Trouble concentrating 1   Moving slowly or fidgety/restless 1   Suicidal thoughts 0   PHQ-9 Score 11   Difficult doing work/chores Somewhat difficult     Interpretation of Total Score  Total Score Depression Severity:  1-4 = Minimal depression, 5-9 = Mild depression, 10-14 = Moderate depression, 15-19 = Moderately severe depression, 20-27 = Severe depression   Psychosocial Evaluation and Intervention:  Psychosocial Evaluation - 02/12/21 0942      Psychosocial Evaluation & Interventions   Interventions Encouraged to exercise with the program and follow exercise prescription;Relaxation education;Stress management education    Comments He can look to his wife of 50 years and daughter. He has two other children who he talkes to.    Expected Outcomes Short: Exercise regularly to support mental health and notify staff of any changes. Long: maintain mental health and well being through teaching of rehab or prescribed medications independently.    Continue Psychosocial Services  Follow up required by staff           Psychosocial Re-Evaluation:   Psychosocial Discharge (Final Psychosocial Re-Evaluation):   Education: Education Goals: Education classes will be provided on a weekly basis, covering required topics. Participant will state understanding/return demonstration of topics presented.  Learning Barriers/Preferences:  Learning Barriers/Preferences - 02/12/21 4097      Learning Barriers/Preferences   Learning Barriers Hearing    Learning Preferences None           General Pulmonary Education Topics:  Infection Prevention: - Provides verbal and written material to  individual with discussion of infection control including proper hand washing and proper equipment cleaning during exercise session. Flowsheet Row Pulmonary Rehab from 02/19/2021 in [facility] Cardiac and Pulmonary Rehab  Date 02/12/21  Educator [facility]  Instruction Review Code 1- Verbalizes Understanding      Falls Prevention: - Provides verbal and written material to individual with discussion of falls prevention and safety. Flowsheet Row Pulmonary Rehab from 02/19/2021 in [facility] Cardiac and Pulmonary Rehab  Date 02/12/21  Educator [facility]  Instruction Review Code 1- Verbalizes Understanding      Chronic Lung Disease Review: - Group verbal instruction with posters, models, PowerPoint presentations and videos,  to review new updates, new respiratory medications, new advancements in procedures and  treatments. Providing information on websites and "800" numbers for continued self-education. Includes information about supplement oxygen, available portable oxygen systems, continuous and intermittent flow rates, oxygen safety, concentrators, and Medicare reimbursement for oxygen. Explanation of Pulmonary Drugs, including class, frequency, complications, importance of spacers, rinsing mouth after steroid MDI's, and proper cleaning methods for nebulizers. Review of basic lung anatomy and physiology related to function, structure, and complications of lung disease. Review of risk factors. Discussion about methods for diagnosing sleep apnea and types of masks and machines for OSA. Includes a review of the use of types of environmental controls: home humidity, furnaces, filters, dust mite/pet prevention, HEPA vacuums. Discussion about weather changes, air quality and the benefits of nasal washing. Instruction on Warning signs, infection symptoms, calling MD promptly, preventive modes, and value of vaccinations. Review of effective airway clearance, coughing and/or vibration techniques. Emphasizing that all should Create an  Action Plan. Written material given at graduation. Flowsheet Row Pulmonary Rehab from 02/19/2021 in [facility] Cardiac and Pulmonary Rehab  Education need identified 02/19/21      AED/CPR: - Group verbal and written instruction with the use of models to demonstrate the basic use of the AED with the basic ABC's of resuscitation.    Anatomy and Cardiac Procedures: - Group verbal and visual presentation and models provide information about basic cardiac anatomy and function. Reviews the testing methods done to diagnose heart disease and the outcomes of the test results. Describes the treatment choices: Medical Management, Angioplasty, or Coronary Bypass Surgery for treating various heart conditions including Myocardial Infarction, Angina, Valve Disease, and Cardiac Arrhythmias.  Written material given at graduation.   Medication Safety: - Group verbal and visual instruction to review commonly prescribed medications for heart and lung disease. Reviews the medication, class of the drug, and side effects. Includes the steps to properly store meds and maintain the prescription regimen.  Written material given at graduation.   Other: -Provides group and verbal instruction on various topics (see comments)   Knowledge Questionnaire Score:  Knowledge Questionnaire Score - 02/19/21 1141      Knowledge Questionnaire Score   Pre Score 16/18 Education Focus: O2 safety            Core Components/Risk Factors/Patient Goals at Admission:  Personal Goals and Risk Factors at Admission - 02/19/21 1142      Core Components/Risk Factors/Patient Goals on Admission    Weight Management Yes;Weight Loss;Obesity    Intervention Weight Management: Develop a combined nutrition and exercise program designed to reach desired caloric intake, while maintaining appropriate intake of nutrient and fiber, sodium and fats, and appropriate energy expenditure required for the weight goal.;Weight Management/Obesity: Establish  reasonable short term and long term weight goals.;Obesity: Provide education and appropriate resources to help participant work on and attain dietary goals.;Weight Management: Provide education and appropriate resources to help participant work on and attain dietary goals.    Admit Weight 251 lb 6.4 oz (114 kg)    Goal Weight: Short Term 246 lb (111.6 kg)    Goal Weight: Long Term 240 lb (108.9 kg)    Expected Outcomes Short Term: Continue to assess and modify interventions until short term weight is achieved;Long Term: Adherence to nutrition and physical activity/exercise program aimed toward attainment of established weight goal;Weight Loss: Understanding of general recommendations for a balanced deficit meal plan, which promotes 1-2 lb weight loss per week and includes a negative energy balance of [phone] kcal/d;Understanding recommendations for meals to include 15-35% energy as protein, 25-35% energy from  fat, 35-60% energy from carbohydrates, less than  of dietary cholesterol, 20-35 gm of total fiber daily;Understanding of distribution of calorie intake throughout the day with the consumption of 4-5 meals/snacks    Improve shortness of breath with ADL's Yes    Intervention Provide education, individualized exercise plan and daily activity instruction to help decrease symptoms of SOB with activities of daily living.    Expected Outcomes Long Term: Be able to perform more ADLs without symptoms or delay the onset of symptoms;Short Term: Improve cardiorespiratory fitness to achieve a reduction of symptoms when performing ADLs    Hypertension Yes    Intervention Provide education on lifestyle modifcations including regular physical activity/exercise, weight management, moderate sodium restriction and increased consumption of fresh fruit, vegetables, and low fat dairy, alcohol moderation, and smoking cessation.;Monitor prescription use compliance.    Expected Outcomes Short Term: Continued assessment  and intervention until BP is < 140/10mm HG in hypertensive participants. < 130/36mm HG in hypertensive participants with diabetes, heart failure or chronic kidney disease.;Long Term: Maintenance of blood pressure at goal levels.    Lipids Yes    Intervention Provide education and support for participant on nutrition & aerobic/resistive exercise along with prescribed medications to achieve LDL 70mg , HDL >40mg .    Expected Outcomes Short Term: Participant states understanding of desired cholesterol values and is compliant with medications prescribed. Participant is following exercise prescription and nutrition guidelines.;Long Term: Cholesterol controlled with medications as prescribed, with individualized exercise RX and with personalized nutrition plan. Value goals: LDL < , HDL > 40 mg.           Education:Diabetes - Individual verbal and written instruction to review signs/symptoms of diabetes, desired ranges of glucose level fasting, after meals and with exercise. Acknowledge that pre and post exercise glucose checks will be done for 3 sessions at entry of program.   Know Your Numbers and Heart Failure: - Group verbal and visual instruction to discuss disease risk factors for cardiac and pulmonary disease and treatment options.  Reviews associated critical values for Overweight/Obesity, Hypertension, Cholesterol, and Diabetes.  Discusses basics of heart failure: signs/symptoms and treatments.  Introduces Heart Failure Zone chart for action plan for heart failure.  Written material given at graduation.   Core Components/Risk Factors/Patient Goals Review:    Core Components/Risk Factors/Patient Goals at Discharge (Final Review):    ITP Comments:  ITP Comments    Row Name 02/12/21 0939 02/19/21 1134         ITP Comments Virtual Visit completed. Patient informed on EP and RD appointment and 6 Minute walk test. Patient also informed of patient health questionnaires on My Chart. Patient  Verbalizes understanding. Visit diagnosis can be found in [facility] 01/25/2021. Completed and gym orientation. Initial ITP created and sent for review to Dr. Bethann Punches, Medical Director.             Comments: Initial ITP

## 2021-02-21 ENCOUNTER — Ambulatory Visit (INDEPENDENT_AMBULATORY_CARE_PROVIDER_SITE_OTHER): Payer: Medicare Other | Admitting: Allergy

## 2021-02-21 ENCOUNTER — Encounter: Payer: Self-pay | Admitting: Allergy

## 2021-02-21 ENCOUNTER — Other Ambulatory Visit: Payer: Self-pay

## 2021-02-21 VITALS — BP 128/62 | HR 69 | Temp 98.0°F | Resp 16 | Wt 250.0 lb

## 2021-02-21 DIAGNOSIS — H1013 Acute atopic conjunctivitis, bilateral: Secondary | ICD-10-CM

## 2021-02-21 DIAGNOSIS — T7800XD Anaphylactic reaction due to unspecified food, subsequent encounter: Secondary | ICD-10-CM | POA: Diagnosis not present

## 2021-02-21 DIAGNOSIS — J3089 Other allergic rhinitis: Secondary | ICD-10-CM

## 2021-02-21 DIAGNOSIS — J449 Chronic obstructive pulmonary disease, unspecified: Secondary | ICD-10-CM

## 2021-02-21 DIAGNOSIS — T508X5D Adverse effect of diagnostic agents, subsequent encounter: Secondary | ICD-10-CM | POA: Diagnosis not present

## 2021-02-21 DIAGNOSIS — R768 Other specified abnormal immunological findings in serum: Secondary | ICD-10-CM | POA: Diagnosis not present

## 2021-02-21 MED ORDER — IPRATROPIUM BROMIDE 0.06 % NA SOLN: 2.0000 | Freq: Three times a day (TID) | NASAL | 5 refills | Status: DC | PRN

## 2021-02-21 NOTE — Patient Instructions (Addendum)
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  IgE is the allergy antibody.  Those who have allergies whether environmental, food, stinging insects, medications etc. will have more IgE than those with no allergies.  Your IgE however is not significantly elevated that warrants further testing at this time.      - continue avoidance measures for grass pollen, weed pollen, tree pollen, molds, dust mites, cat, dog, mixed feathers, horse, cockroach, mouse and tobacco leaf.  - continue avoidance of shrimp, crab, lobster and strawberry.  These foods were positive on skin testing.  Recommend avoiding candy corn do to symptoms likely related to the food dye -will obtain serum IgE levels for shellfish panel to determine if you can perform in-office challenge to rule out allergy - continue to avoid stinging insects as much as possible - continue to have access to self-injectable epinephrine (Epipen or AuviQ) 0.3mg  at all times - follow emergency action plan in case of allergic reaction  -For management of your environmental allergy symptoms recommend taking a long-acting antihistamine like Zyrtec 10 mg, Allegra 180 mg or Xyzal 5 mg.  If you feel like Zyrtec is not very effective then would try a one of the other recommendations -Continue Singulair 10 mg daily at bedtime -For nasal congestion can use Flonase 2 sprays each nostril daily for 1 to 2 weeks at a time before stopping once symptoms improve -For nasal drainage use nasal Atrovent 2 sprays 1-2 times a day as needed.    -stop Azelastine as not effective -For itchy watery eyes recommend use of olopatadine 0.2% 1 drop each eye daily as needed -If medication management is not effective enough consider another course of allergen immunotherapy (allergy shots)  -recommend premedication regimen if needing to have imaging studies that require IV contrast.   Radiology is typically aware of the premedication regimen to use.  -Continue medication recommendations per Dr. Craige Cotta -If needing to step up your asthma/COPD therapy may consider Dupixent as this would have impact on your IgE levels  Follow-up in 6 months or sooner if needed

## 2021-02-21 NOTE — Progress Notes (Signed)
Follow-up Note  RE: Tim Thomas MRN: [id number] DOB: September 15, 1951 Date of Office Visit: 02/21/2021   History of present illness: Tim Thomas is a 70 y.o. male presenting today for follow-up of allergic rhinitis with conjunctivitis, food allergy, elevated IgE, hymenoptera allergy, asthma/copd overlap.  He was last seen in the office on 08/23/20 by myself.  He states he has not had any major health changes, surgeries or hospitalizations.  He does note he continues to have post nasal drainage and does not feel the azelastine nasal spray is not helping.  Taking singulair and antihistamine.  He is avoiding shellfish and would like to eat shellfish if he is not truly allergic.  He states he doesn't care much for strawberries in general thus isn't interested in eating strawberries.  He does have access to epinephrine device which he has not required use.  He has not had any stings.  He continues to take Trelegy daily.  Denies use of albuterol.  He did have Covid illness in January and reports having difficulty breathing/shortness of breath with it and is now in pulmonary rehab.    Review of systems: Review of Systems  Constitutional: Negative.   HENT:       See HPI  Eyes: Negative.   Respiratory: Negative.   Cardiovascular: Negative.   Gastrointestinal: Negative.   Musculoskeletal: Negative.   Skin: Negative.   Neurological: Negative.     All other systems negative unless noted above in HPI  Past medical/social/surgical/family history have been reviewed and are unchanged unless specifically indicated below.  No changes  Medication List: Current Outpatient Medications  Medication Sig Dispense Refill  . acetaminophen (TYLENOL) 500 MG tablet Take 500 mg by mouth every 6 (six) hours as needed for moderate pain.    Marland Kitchen albuterol (PROAIR HFA) 108 (90 Base) MCG/ACT inhaler Inhale 2 puffs into the lungs every 6 (six) hours as needed for wheezing or shortness of breath. 18 g 2  . amLODipine  (NORVASC) 10 MG tablet Take 10 mg by mouth daily.     Marland Kitchen azelastine (ASTELIN) 0.1 % nasal spray Place 2 sprays into both nostrils 2 (two) times daily. 30 mL 5  . cetirizine (ZYRTEC) 10 MG tablet Take 10 mg by mouth daily. As needed    . EPINEPHrine 0.3 mg/0.3 mL IJ SOAJ injection Inject 0.3 mLs (0.3 mg total) into the muscle once. (Patient taking differently: Inject 0.3 mg into the muscle as needed for anaphylaxis.) 2 Device 1  . ipratropium (ATROVENT) 0.06 % nasal spray Place 2 sprays into both nostrils 3 (three) times daily as needed (nasal drainage). 15 mL 5  . linaclotide (LINZESS) 145 MCG CAPS capsule Take 145 mcg by mouth daily as needed.    Marland Kitchen losartan (COZAAR) 100 MG tablet Take 100 mg by mouth daily.    . minocycline (DYNACIN) 100 MG tablet Take 100 mg by mouth 2 (two) times daily.    . Minocycline HCl 1 MG MISC     . montelukast (SINGULAIR) 10 MG tablet TAKE 1 TABLET BY MOUTH EVERYDAY AT BEDTIME 90 tablet 3  . NEVANAC 0.1 % ophthalmic suspension SMARTSIG:In Eye(s)    . rosuvastatin (CRESTOR) 20 MG tablet Take 20 mg by mouth daily.     Marland Kitchen sulfamethoxazole-trimethoprim (BACTRIM DS) 800-160 MG tablet Take 1 tablet by mouth 2 (two) times daily.    . TRELEGY ELLIPTA 100-62.5-25 MCG/INH AEPB INHALE 1 PUFF BY MOUTH EVERY DAY 60 each 5   No current facility-administered medications for this  visit.     Known medication allergies: Allergies  Allergen Reactions  . Bee Venom Swelling    Facial and sit of sting swelling Has EPi-pen  . Ciprofloxacin Other (See Comments)    Neck stiffness and pains  . Ivp Dye [Iodinated Diagnostic Agents] Hives and Other (See Comments)    Pt is allergic to contrast material injected by an opthamologist, not contrast used by radiology///a.calhoun Benadryl works for prophylaxis  . Shellfish Allergy     Per patient's allergist to avoid shellfish due to allergy to contrast dye.      Physical examination: Blood pressure 128/62, pulse 69, temperature 98 F (36.7  C), temperature source Temporal, resp. rate 16, weight 250 lb (113.4 kg), SpO2 98 %.  General: Alert, interactive, in no acute distress. HEENT: PERRLA, TMs pearly gray, turbinates non-edematous without discharge, post-pharynx non erythematous. Neck: Supple without lymphadenopathy. Lungs: Clear to auscultation without wheezing, rhonchi or rales. {no increased work of breathing. CV: Normal S1, S2 without murmurs. Abdomen: Nondistended, nontender. Skin: Warm and dry, without lesions or rashes. Extremities:  No clubbing, cyanosis or edema. Neuro:   Grossly intact.  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  IgE is the allergy antibody.  Those who have allergies whether environmental, food, stinging insects, medications etc. will have more IgE than those with no allergies.  Your IgE however is not significantly elevated that warrants further testing at this time.      - continue avoidance measures for grass pollen, weed pollen, tree pollen, molds, dust mites, cat, dog, mixed feathers, horse, cockroach, mouse and tobacco leaf.  - continue avoidance of shrimp, crab, lobster and strawberry.  These foods were positive on skin testing.  Recommend avoiding candy corn do to symptoms likely related to the food dye -will obtain serum IgE levels for shellfish panel to determine if you can perform in-office challenge to rule out allergy - continue to avoid stinging insects as much as possible - continue to have access to self-injectable epinephrine (Epipen or AuviQ) 0.3mg  at all times - follow emergency action plan in case of allergic reaction  -For management of your environmental allergy symptoms recommend taking a long-acting antihistamine like Zyrtec 10 mg, Allegra 180 mg or Xyzal 5 mg.  If you feel like Zyrtec is not very effective then  would try a one of the other recommendations -Continue Singulair 10 mg daily at bedtime -For nasal congestion can use Flonase 2 sprays each nostril daily for 1 to 2 weeks at a time before stopping once symptoms improve -For nasal drainage use nasal Atrovent 2 sprays 1-2 times a day as needed.    -stop Azelastine as not effective -For itchy watery eyes recommend use of olopatadine 0.2% 1 drop each eye daily as needed -If medication management is not effective enough consider another course of allergen immunotherapy (allergy shots)  -recommend premedication regimen if needing to have imaging studies that require IV contrast.  Radiology is typically aware of the premedication regimen to use.  -Continue medication recommendations per Dr. Craige Cotta -If needing to step up your asthma/COPD therapy may consider Dupixent as this would have impact on your IgE levels  Follow-up in 6 months or sooner if needed  I appreciate the opportunity to take part in Huntley's care. Please do not hesitate to contact me with questions.  Sincerely,   Thales Knipple  Nelva Bush, MD Allergy/Immunology [facility]

## 2021-02-22 ENCOUNTER — Encounter: Payer: Medicare Other | Admitting: *Deleted

## 2021-02-22 DIAGNOSIS — J449 Chronic obstructive pulmonary disease, unspecified: Secondary | ICD-10-CM | POA: Diagnosis not present

## 2021-02-22 NOTE — Progress Notes (Signed)
Daily Session Note  Patient Details  Name: Tim Thomas MRN: [id number] Date of Birth: Jul 15, 1951 Referring Provider:   Flowsheet Row Pulmonary Rehab from 02/19/2021 in [facility] Cardiac and Pulmonary Rehab  Referring Provider Truitt Merle MD      Encounter Date: 02/22/2021  Check In:  Session Check In - 02/22/21 1058      Check-In   Supervising physician immediately available to respond to emergencies See telemetry face sheet for immediately available ER MD    Location [facility]-Cardiac & Pulmonary Rehab    Staff Present Renita Papa, RN BSN;Joseph [address] [place], [place], RCEP, CCRP, CCET    Virtual Visit No    Medication changes reported     No    Fall or balance concerns reported    No    Tobacco Cessation No Change    Current number of cigarettes/nicotine per day     0    Warm-up and Cool-down Performed on first and last piece of equipment    Resistance Training Performed Yes    VAD Patient? No    PAD/SET Patient? No      Pain Assessment   Currently in Pain? No/denies              Social History   Tobacco Use  Smoking Status Former Smoker  . Packs/day: 1.50  . Years: 53.00  . Pack years: 79.50  . Types: Cigarettes  . Quit date: 03/04/2019  . Years since quitting: 1.9  Smokeless Tobacco Never Used    Goals Met:  Independence with exercise equipment Exercise tolerated well No report of cardiac concerns or symptoms Strength training completed today  Goals Unmet:  Not Applicable  Comments: First full day of exercise!  Patient was oriented to gym and equipment including functions, settings, policies, and procedures.  Patient's individual exercise prescription and treatment plan were reviewed.  All starting workloads were established based on the results of the 6 minute walk test done at initial orientation visit.  The plan for exercise progression was also introduced and progression will be customized based on patient's performance and  goals.    Dr. Emily Filbert is Medical Director for [place] and [facility].

## 2021-02-24 LAB — ALLERGEN PROFILE, SHELLFISH
Clam IgE: 0.17 kU/L — AB
F023-IgE Crab: 0.87 kU/L — AB
F080-IgE Lobster: 0.24 kU/L — AB
F290-IgE Oyster: <0.1 kU/L
Scallop IgE: 0.25 kU/L — AB
Shrimp IgE: 2.78 kU/L — AB

## 2021-02-25 ENCOUNTER — Encounter: Payer: Medicare Other | Admitting: *Deleted

## 2021-02-25 ENCOUNTER — Other Ambulatory Visit: Payer: Self-pay

## 2021-02-25 DIAGNOSIS — J449 Chronic obstructive pulmonary disease, unspecified: Secondary | ICD-10-CM | POA: Diagnosis not present

## 2021-02-25 NOTE — Progress Notes (Signed)
Daily Session Note  Patient Details  Name: Brittin Janik MRN: [id number] Date of Birth: 1951/05/12 Referring Provider:   Flowsheet Row Pulmonary Rehab from 02/19/2021 in [facility] Cardiac and Pulmonary Rehab  Referring Provider Truitt Merle MD      Encounter Date: 02/25/2021  Check In:  Session Check In - 02/25/21 1102      Check-In   Supervising physician immediately available to respond to emergencies See telemetry face sheet for immediately available ER MD    Location [facility]-Cardiac & Pulmonary Rehab    Staff Present Renita Papa, RN BSN;Joseph Lou Miner, [place] Exercise Physiologist;Kelly Amedeo Plenty, [place], ACSM CEP, Exercise Physiologist    Virtual Visit No    Medication changes reported     No    Fall or balance concerns reported    No    Tobacco Cessation No Change    Current number of cigarettes/nicotine per day     0    Warm-up and Cool-down Performed on first and last piece of equipment    Resistance Training Performed Yes    VAD Patient? No    PAD/SET Patient? No      Pain Assessment   Currently in Pain? No/denies              Social History   Tobacco Use  Smoking Status Former Smoker  . Packs/day: 1.50  . Years: 53.00  . Pack years: 79.50  . Types: Cigarettes  . Quit date: 03/04/2019  . Years since quitting: 1.9  Smokeless Tobacco Never Used    Goals Met:  Independence with exercise equipment Exercise tolerated well No report of cardiac concerns or symptoms Strength training completed today  Goals Unmet:  Not Applicable  Comments: Pt able to follow exercise prescription today without complaint.  Will continue to monitor for progression.    Dr. Emily Filbert is Medical Director for [place] and [facility].

## 2021-03-01 ENCOUNTER — Other Ambulatory Visit: Payer: Self-pay

## 2021-03-01 ENCOUNTER — Encounter: Payer: Medicare Other | Admitting: *Deleted

## 2021-03-01 DIAGNOSIS — J449 Chronic obstructive pulmonary disease, unspecified: Secondary | ICD-10-CM | POA: Diagnosis not present

## 2021-03-01 NOTE — Progress Notes (Signed)
Daily Session Note  Patient Details  Name: Tim Thomas MRN: [id number] Date of Birth: 1951/11/17 Referring Provider:   Flowsheet Row Pulmonary Rehab from 02/19/2021 in [facility] Cardiac and Pulmonary Rehab  Referring Provider Truitt Merle MD      Encounter Date: 03/01/2021  Check In:  Session Check In - 03/01/21 1059      Check-In   Supervising physician immediately available to respond to emergencies See telemetry face sheet for immediately available ER MD    Location [facility]-Cardiac & Pulmonary Rehab    Staff Present Renita Papa, RN BSN;Joseph [address] [place], [place], RCEP, CCRP, CCET    Virtual Visit No    Medication changes reported     No    Fall or balance concerns reported    No    Tobacco Cessation No Change    Current number of cigarettes/nicotine per day     0    Warm-up and Cool-down Performed on first and last piece of equipment    Resistance Training Performed Yes    VAD Patient? No    PAD/SET Patient? No      Pain Assessment   Currently in Pain? No/denies              Social History   Tobacco Use  Smoking Status Former Smoker  . Packs/day: 1.50  . Years: 53.00  . Pack years: 79.50  . Types: Cigarettes  . Quit date: 03/04/2019  . Years since quitting: 1.9  Smokeless Tobacco Never Used    Goals Met:  Independence with exercise equipment Exercise tolerated well No report of cardiac concerns or symptoms Strength training completed today  Goals Unmet:  Not Applicable  Comments: Pt able to follow exercise prescription today without complaint.  Will continue to monitor for progression.    Dr. Emily Filbert is Medical Director for [place] and [facility].

## 2021-03-04 ENCOUNTER — Other Ambulatory Visit: Payer: Self-pay

## 2021-03-04 ENCOUNTER — Encounter: Payer: Medicare Other | Admitting: *Deleted

## 2021-03-04 DIAGNOSIS — J449 Chronic obstructive pulmonary disease, unspecified: Secondary | ICD-10-CM | POA: Diagnosis not present

## 2021-03-04 NOTE — Progress Notes (Signed)
Daily Session Note  Patient Details  Name: Tim Thomas MRN: [id number] Date of Birth: [birth year] Referring Provider:   Flowsheet Row Pulmonary Rehab from 02/19/2021 in [facility] Cardiac and Pulmonary Rehab  Referring Provider Truitt Merle MD      Encounter Date: 03/04/2021  Check In:  Session Check In - 03/04/21 1057      Check-In   Supervising physician immediately available to respond to emergencies See telemetry face sheet for immediately available ER MD    Location [facility]-Cardiac & Pulmonary Rehab    Staff Present Renita Papa, RN BSN;Joseph [address] [place], [place], ACSM CEP, Exercise Physiologist;Kelly Rosalia Hammers, MPA, RN    Virtual Visit No    Medication changes reported     No    Fall or balance concerns reported    No    Tobacco Cessation No Change    Current number of cigarettes/nicotine per day     0    Warm-up and Cool-down Performed on first and last piece of equipment    Resistance Training Performed Yes    VAD Patient? No    PAD/SET Patient? No      Pain Assessment   Currently in Pain? No/denies              Social History   Tobacco Use  Smoking Status Former Smoker  . Packs/day: 1.50  . Years: 53.00  . Pack years: 79.50  . Types: Cigarettes  . Quit date: 03/04/2019  . Years since quitting: 2.0  Smokeless Tobacco Never Used    Goals Met:  Independence with exercise equipment Exercise tolerated well No report of cardiac concerns or symptoms Strength training completed today  Goals Unmet:  Not Applicable  Comments: Pt able to follow exercise prescription today without complaint.  Will continue to monitor for progression.    Dr. Emily Filbert is Medical Director for [place] and [facility].

## 2021-03-06 ENCOUNTER — Other Ambulatory Visit: Payer: Self-pay

## 2021-03-06 DIAGNOSIS — J449 Chronic obstructive pulmonary disease, unspecified: Secondary | ICD-10-CM | POA: Diagnosis not present

## 2021-03-06 NOTE — Progress Notes (Signed)
Daily Session Note  Patient Details  Name: Tim Thomas MRN: [id number] Date of Birth: [birth year] Referring Provider:   Flowsheet Row Pulmonary Rehab from 02/19/2021 in [facility] Cardiac and Pulmonary Rehab  Referring Provider Truitt Merle MD      Encounter Date: 03/06/2021  Check In:  Session Check In - 03/06/21 1104      Check-In   Supervising physician immediately available to respond to emergencies See telemetry face sheet for immediately available ER MD    Location [facility]-Cardiac & Pulmonary Rehab    Staff Present Birdie Sons, MPA, RN;Amanda Sommer, BA, ACSM CEP, Exercise Physiologist;Joseph Tessie Fass RCP,RRT,BSRT    Virtual Visit No    Medication changes reported     No    Fall or balance concerns reported    No    Tobacco Cessation No Change    Current number of cigarettes/nicotine per day     0    Warm-up and Cool-down Performed on first and last piece of equipment    Resistance Training Performed Yes    VAD Patient? No    PAD/SET Patient? No      Pain Assessment   Currently in Pain? No/denies              Social History   Tobacco Use  Smoking Status Former Smoker  . Packs/day: 1.50  . Years: 53.00  . Pack years: 79.50  . Types: Cigarettes  . Quit date: 03/04/2019  . Years since quitting: 2.0  Smokeless Tobacco Never Used    Goals Met:  Independence with exercise equipment Exercise tolerated well No report of cardiac concerns or symptoms Strength training completed today  Goals Unmet:  Not Applicable  Comments: Pt able to follow exercise prescription today without complaint.  Will continue to monitor for progression.    Dr. Emily Filbert is Medical Director for [place] and [facility].

## 2021-03-08 ENCOUNTER — Encounter: Payer: Medicare Other | Admitting: *Deleted

## 2021-03-08 ENCOUNTER — Other Ambulatory Visit: Payer: Self-pay

## 2021-03-08 DIAGNOSIS — J449 Chronic obstructive pulmonary disease, unspecified: Secondary | ICD-10-CM

## 2021-03-08 NOTE — Progress Notes (Signed)
Daily Session Note  Patient Details  Name: Tim Thomas MRN: [id number] Date of Birth: 10-Mar-1951 Referring Provider:   Flowsheet Row Pulmonary Rehab from 02/19/2021 in [facility] Cardiac and Pulmonary Rehab  Referring Provider Truitt Merle MD      Encounter Date: 03/08/2021  Check In:  Session Check In - 03/08/21 1058      Check-In   Supervising physician immediately available to respond to emergencies See telemetry face sheet for immediately available ER MD    Location [facility]-Cardiac & Pulmonary Rehab    Staff Present Renita Papa, RN BSN;Joseph [address] [place], [place], RCEP, CCRP, CCET    Virtual Visit No    Medication changes reported     No    Fall or balance concerns reported    No    Tobacco Cessation No Change    Current number of cigarettes/nicotine per day     0    Warm-up and Cool-down Performed on first and last piece of equipment    Resistance Training Performed Yes    VAD Patient? No    PAD/SET Patient? No      Pain Assessment   Currently in Pain? No/denies              Social History   Tobacco Use  Smoking Status Former Smoker  . Packs/day: 1.50  . Years: 53.00  . Pack years: 79.50  . Types: Cigarettes  . Quit date: 03/04/2019  . Years since quitting: 2.0  Smokeless Tobacco Never Used    Goals Met:  Independence with exercise equipment Exercise tolerated well No report of cardiac concerns or symptoms Strength training completed today  Goals Unmet:  Not Applicable  Comments: Pt able to follow exercise prescription today without complaint.  Will continue to monitor for progression.    Dr. Emily Filbert is Medical Director for [facility].

## 2021-03-13 ENCOUNTER — Encounter: Payer: Self-pay | Admitting: *Deleted

## 2021-03-13 ENCOUNTER — Other Ambulatory Visit: Payer: Self-pay

## 2021-03-13 DIAGNOSIS — J449 Chronic obstructive pulmonary disease, unspecified: Secondary | ICD-10-CM | POA: Diagnosis not present

## 2021-03-13 NOTE — Progress Notes (Signed)
Daily Session Note  Patient Details  Name: Tim Thomas MRN: [id number] Date of Birth: [birth year] Referring Provider:   Flowsheet Row Pulmonary Rehab from 02/19/2021 in [facility] Cardiac and Pulmonary Rehab  Referring Provider Truitt Merle MD      Encounter Date: 03/13/2021  Check In:  Session Check In - 03/13/21 1040      Check-In   Supervising physician immediately available to respond to emergencies See telemetry face sheet for immediately available ER MD    Location [facility]-Cardiac & Pulmonary Rehab    Staff Present Birdie Sons, MPA, Elveria Rising, BA, ACSM CEP, Exercise Physiologist;Joseph Tessie Fass RCP,RRT,BSRT    Virtual Visit No    Medication changes reported     No    Fall or balance concerns reported    No    Tobacco Cessation No Change    Warm-up and Cool-down Performed on first and last piece of equipment    Resistance Training Performed Yes    VAD Patient? No    PAD/SET Patient? No      Pain Assessment   Currently in Pain? No/denies              Social History   Tobacco Use  Smoking Status Former Smoker  . Packs/day: 1.50  . Years: 53.00  . Pack years: 79.50  . Types: Cigarettes  . Quit date: 03/04/2019  . Years since quitting: 2.0  Smokeless Tobacco Never Used    Goals Met:  Independence with exercise equipment Exercise tolerated well No report of cardiac concerns or symptoms Strength training completed today  Goals Unmet:  Not Applicable  Comments: Pt able to follow exercise prescription today without complaint.  Will continue to monitor for progression.    Dr. Emily Filbert is Medical Director for [facility].

## 2021-03-13 NOTE — Progress Notes (Signed)
Pulmonary Individual Treatment Plan  Patient Details  Name: Tim Thomas MRN: [id number] Date of Birth: 10-27-1951 Referring Provider:   Flowsheet Row Pulmonary Rehab from 02/19/2021 in [facility] Cardiac and Pulmonary Rehab  Referring Provider Daiva Huge MD      Initial Encounter Date:  Flowsheet Row Pulmonary Rehab from 02/19/2021 in [facility] Cardiac and Pulmonary Rehab  Date 02/19/21      Visit Diagnosis: COPD with asthma (HCC)  Patient's Home Medications on Admission:  Current Outpatient Medications:  .  acetaminophen (TYLENOL) 500 MG tablet, Take 500 mg by mouth every 6 (six) hours as needed for moderate pain., Disp: , Rfl:  .  albuterol (PROAIR HFA) 108 (90 Base) MCG/ACT inhaler, Inhale 2 puffs into the lungs every 6 (six) hours as needed for wheezing or shortness of breath., Disp: 18 g, Rfl: 2 .  amLODipine (NORVASC) 10 MG tablet, Take 10 mg by mouth daily. , Disp: , Rfl:  .  azelastine (ASTELIN) 0.1 % nasal spray, Place 2 sprays into both nostrils 2 (two) times daily., Disp: 30 mL, Rfl: 5 .  cetirizine (ZYRTEC) 10 MG tablet, Take 10 mg by mouth daily. As needed, Disp: , Rfl:  .  EPINEPHrine 0.3 mg/0.3 mL IJ SOAJ injection, Inject 0.3 mLs (0.3 mg total) into the muscle once. (Patient taking differently: Inject 0.3 mg into the muscle as needed for anaphylaxis.), Disp: 2 Device, Rfl: 1 .  ipratropium (ATROVENT) 0.06 % nasal spray, Place 2 sprays into both nostrils 3 (three) times daily as needed (nasal drainage)., Disp: 15 mL, Rfl: 5 .  linaclotide (LINZESS) 145 MCG CAPS capsule, Take 145 mcg by mouth daily as needed., Disp: , Rfl:  .  losartan (COZAAR) 100 MG tablet, Take 100 mg by mouth daily., Disp: , Rfl:  .  minocycline (DYNACIN) 100 MG tablet, Take 100 mg by mouth 2 (two) times daily., Disp: , Rfl:  .  Minocycline HCl 1 MG MISC, , Disp: , Rfl:  .  montelukast (SINGULAIR) 10 MG tablet, TAKE 1 TABLET BY MOUTH EVERYDAY AT BEDTIME, Disp: 90 tablet, Rfl: 3 .  NEVANAC 0.1 % ophthalmic  suspension, SMARTSIG:In Eye(s), Disp: , Rfl:  .  rosuvastatin (CRESTOR) 20 MG tablet, Take 20 mg by mouth daily. , Disp: , Rfl:  .  sulfamethoxazole-trimethoprim (BACTRIM DS) 800-160 MG tablet, Take 1 tablet by mouth 2 (two) times daily., Disp: , Rfl:  .  TRELEGY ELLIPTA 100-62.5-25 MCG/INH AEPB, INHALE 1 PUFF BY MOUTH EVERY DAY, Disp: 60 each, Rfl: 5  Past Medical History: Past Medical History:  Diagnosis Date  . Anxiety   . BPH (benign prostatic hyperplasia)   . COPD (chronic obstructive pulmonary disease) (HCC)   . 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 Foley catheter in place    02-17-18, occ clots in bag  . High cholesterol   . Hypertension   . Prostate enlargement   . Shoulder pain    right chronic  . Urinary retention     august  2018  . Wet senile macular degeneration (HCC)     right eye current tx, left eye tx in past    Tobacco Use: Social History   Tobacco Use  Smoking Status Former Smoker  . Packs/day: 1.50  . Years: 53.00  . Pack years: 79.50  . Types: Cigarettes  . Quit date: 03/04/2019  . Years since quitting: 2.0  Smokeless Tobacco Never Used    Labs: Recent Review Flowsheet Data   There is no flowsheet data to display.  Pulmonary Assessment Scores:  Pulmonary Assessment Scores    Row Name 02/19/21 1144         ADL UCSD   ADL Phase Entry     SOB Score total 61     Rest 3     Walk 3     Stairs 4     Bath 2     Dress 1     Shop 3           CAT Score   CAT Score 25           mMRC Score   mMRC Score 1            UCSD: Self-administered rating of dyspnea associated with activities of daily living (ADLs) 6-point scale (0 = "not at all" to 5 = "maximal or unable to do because of breathlessness")  Scoring Scores range from 0 to 120.  Minimally important difference is 5 units  CAT: CAT can identify the health impairment of COPD patients and is better correlated with disease progression.  CAT has a scoring range of zero to 40. The  CAT score is classified into four groups of low (less than 10), medium (10 - 20), high (21-30) and very high (31-40) based on the impact level of disease on health status. A CAT score over 10 suggests significant symptoms.  A worsening CAT score could be explained by an exacerbation, poor medication adherence, poor inhaler technique, or progression of COPD or comorbid conditions.  CAT MCID is 2 points  mMRC: mMRC (Modified Medical Research Council) Dyspnea Scale is used to assess the degree of baseline functional disability in patients of respiratory disease due to dyspnea. No minimal important difference is established. A decrease in score of 1 point or greater is considered a positive change.   Pulmonary Function Assessment:  Pulmonary Function Assessment - 02/12/21 0938      Breath   Shortness of Breath Yes;Limiting activity           Exercise Target Goals: Exercise Program Goal: Individual exercise prescription set using results from initial 6 min walk test and THRR while considering  patient's activity barriers and safety.   Exercise Prescription Goal: Initial exercise prescription builds to 30-45 minutes a day of aerobic activity, 2-3 days per week.  Home exercise guidelines will be given to patient during program as part of exercise prescription that the participant will acknowledge.  Education: Aerobic Exercise: - Group verbal and visual presentation on the components of exercise prescription. Introduces F.I.T.T principle from ACSM for exercise prescriptions.  Reviews F.I.T.T. principles of aerobic exercise including progression. Written material given at graduation.   Education: Resistance Exercise: - Group verbal and visual presentation on the components of exercise prescription. Introduces F.I.T.T principle from ACSM for exercise prescriptions  Reviews F.I.T.T. principles of resistance exercise including progression. Written material given at graduation.    Education:  Exercise & Equipment Safety: - Individual verbal instruction and demonstration of equipment use and safety with use of the equipment. Flowsheet Row Pulmonary Rehab from 03/06/2021 in [facility] Cardiac and Pulmonary Rehab  Date 02/12/21  Educator [facility]  Instruction Review Code 1- Verbalizes Understanding      Education: Exercise Physiology & General Exercise Guidelines: - Group verbal and written instruction with models to review the exercise physiology of the cardiovascular system and associated critical values. Provides general exercise guidelines with specific guidelines to those with heart or lung disease.    Education: Flexibility, Balance, Mind/Body Relaxation: - Group verbal  and visual presentation with interactive activity on the components of exercise prescription. Introduces F.I.T.T principle from ACSM for exercise prescriptions. Reviews F.I.T.T. principles of flexibility and balance exercise training including progression. Also discusses the mind body connection.  Reviews various relaxation techniques to help reduce and manage stress (i.e. Deep breathing, progressive muscle relaxation, and visualization). Balance handout provided to take home. Written material given at graduation.   Activity Barriers & Risk Stratification:  Activity Barriers & Cardiac Risk Stratification - 02/19/21 1137      Activity Barriers & Cardiac Risk Stratification   Activity Barriers Neck/Spine Problems;Shortness of Breath;Muscular Weakness;Deconditioning;Balance Concerns;Other (comment)    Comments Hx vertigo           6 Minute Walk:  6 Minute Walk    Row Name 02/19/21 1135         6 Minute Walk   Phase Initial     Distance 1390 feet     Walk Time 6 minutes     # of Rest Breaks 0     MPH 2.63     METS 2.83     RPE 11     Perceived Dyspnea  1     VO2 Peak 9.88     Symptoms Yes (comment)     Comments SOB, a little lightheaded at beginning but dissipated     Resting HR 75 bpm     Resting BP  134/60     Resting Oxygen Saturation  97 %     Exercise Oxygen Saturation  during 6 min walk 91 %     Max Ex. HR 108 bpm     Max Ex. BP 164/60     2 Minute Post BP 142/64           Interval HR   1 Minute HR 97     2 Minute HR 104     3 Minute HR 106     4 Minute HR 104     5 Minute HR 106     6 Minute HR 108     2 Minute Post HR 83     Interval Heart Rate? Yes           Interval Oxygen   Interval Oxygen? Yes     Baseline Oxygen Saturation % 97 %     1 Minute Oxygen Saturation % 92 %     1 Minute Liters of Oxygen 0 L  Room Air     2 Minute Oxygen Saturation % 92 %     2 Minute Liters of Oxygen 0 L     3 Minute Oxygen Saturation % 91 %     3 Minute Liters of Oxygen 0 L     4 Minute Oxygen Saturation % 93 %     4 Minute Liters of Oxygen 0 L     5 Minute Oxygen Saturation % 94 %     5 Minute Liters of Oxygen 0 L     6 Minute Oxygen Saturation % 93 %     6 Minute Liters of Oxygen 0 L     2 Minute Post Oxygen Saturation % 96 %     2 Minute Post Liters of Oxygen 0 L           Oxygen Initial Assessment:  Oxygen Initial Assessment - 02/12/21 0937      Home Oxygen   Home Oxygen Device None    Sleep Oxygen Prescription None    Home Exercise  Oxygen Prescription None    Home Resting Oxygen Prescription None      Initial 6 min Walk   Oxygen Used None      Program Oxygen Prescription   Program Oxygen Prescription None      Intervention   Short Term Goals To learn and understand importance of monitoring SPO2 with pulse oximeter and demonstrate accurate use of the pulse oximeter.;To learn and understand importance of maintaining oxygen saturations>88%;To learn and demonstrate proper pursed lip breathing techniques or other breathing techniques.;To learn and demonstrate proper use of respiratory medications    Long  Term Goals Verbalizes importance of monitoring SPO2 with pulse oximeter and return demonstration;Maintenance of O2 saturations>88%;Exhibits proper breathing  techniques, such as pursed lip breathing or other method taught during program session;Compliance with respiratory medication;Demonstrates proper use of MDI's           Oxygen Re-Evaluation:  Oxygen Re-Evaluation    Row Name 02/22/21 1100             Program Oxygen Prescription   Program Oxygen Prescription None               Home Oxygen   Home Oxygen Device None       Sleep Oxygen Prescription None       Home Exercise Oxygen Prescription None       Home Resting Oxygen Prescription None               Goals/Expected Outcomes   Short Term Goals To learn and understand importance of monitoring SPO2 with pulse oximeter and demonstrate accurate use of the pulse oximeter.;To learn and understand importance of maintaining oxygen saturations>88%;To learn and demonstrate proper pursed lip breathing techniques or other breathing techniques.;To learn and demonstrate proper use of respiratory medications       Long  Term Goals Verbalizes importance of monitoring SPO2 with pulse oximeter and return demonstration;Maintenance of O2 saturations>88%;Exhibits proper breathing techniques, such as pursed lip breathing or other method taught during program session;Compliance with respiratory medication;Demonstrates proper use of MDI's       Comments Reviewed PLB technique with pt.  Talked about how it works and it's importance in maintaining their exercise saturations.       Goals/Expected Outcomes Short: Become more profiecient at using PLB.   Long: Become independent at using PLB.              Oxygen Discharge (Final Oxygen Re-Evaluation):  Oxygen Re-Evaluation - 02/22/21 1100      Program Oxygen Prescription   Program Oxygen Prescription None      Home Oxygen   Home Oxygen Device None    Sleep Oxygen Prescription None    Home Exercise Oxygen Prescription None    Home Resting Oxygen Prescription None      Goals/Expected Outcomes   Short Term Goals To learn and understand importance of  monitoring SPO2 with pulse oximeter and demonstrate accurate use of the pulse oximeter.;To learn and understand importance of maintaining oxygen saturations>88%;To learn and demonstrate proper pursed lip breathing techniques or other breathing techniques.;To learn and demonstrate proper use of respiratory medications    Long  Term Goals Verbalizes importance of monitoring SPO2 with pulse oximeter and return demonstration;Maintenance of O2 saturations>88%;Exhibits proper breathing techniques, such as pursed lip breathing or other method taught during program session;Compliance with respiratory medication;Demonstrates proper use of MDI's    Comments Reviewed PLB technique with pt.  Talked about how it works and it's importance in maintaining their exercise  saturations.    Goals/Expected Outcomes Short: Become more profiecient at using PLB.   Long: Become independent at using PLB.           Initial Exercise Prescription:  Initial Exercise Prescription - 02/19/21 1100      Date of Initial Exercise RX and Referring Provider   Date 02/19/21    Referring Provider Daiva Huge MD      Treadmill   MPH 2.2    Grade 1    Minutes 15    METs 2.84      Recumbant Elliptical   Level 1    RPM 50    Minutes 15    METs 2      REL-XR   Level 2    Speed 50    Minutes 15    METs 2      Prescription Details   Frequency (times per week) 3    Duration Progress to 30 minutes of continuous aerobic without signs/symptoms of physical distress      Intensity   THRR 40-80% of Max Heartrate 105-135    Ratings of Perceived Exertion 11-13    Perceived Dyspnea 0-4      Progression   Progression Continue to progress workloads to maintain intensity without signs/symptoms of physical distress.      Resistance Training   Training Prescription Yes    Weight 5 lb    Reps 10-15           Perform Capillary Blood Glucose checks as needed.  Exercise Prescription Changes:  Exercise Prescription Changes     Row Name 02/19/21 1100 02/25/21 1200 03/11/21 0800         Response to Exercise   Blood Pressure (Admit) 134/60 122/60 108/60     Blood Pressure (Exercise) 164/60 164/60 152/84     Blood Pressure (Exit) 126/60 122/64 126/64     Heart Rate (Admit) 75 bpm 74 bpm 87 bpm     Heart Rate (Exercise) 108 bpm 107 bpm 96 bpm     Heart Rate (Exit) 88 bpm 94 bpm 88 bpm     Oxygen Saturation (Admit) 97 % 97 % 96 %     Oxygen Saturation (Exercise) 91 % 93 % 95 %     Oxygen Saturation (Exit) 96 % 96 % 96 %     Rating of Perceived Exertion (Exercise) 11 15 12      Perceived Dyspnea (Exercise) 1 3 2      Symptoms SOB, lightheaded none none     Comments walk test results second day --     Duration -- Progress to 30 minutes of  aerobic without signs/symptoms of physical distress Continue with 30 min of aerobic exercise without signs/symptoms of physical distress.     Intensity -- THRR unchanged THRR unchanged           Progression   Progression -- Continue to progress workloads to maintain intensity without signs/symptoms of physical distress. Continue to progress workloads to maintain intensity without signs/symptoms of physical distress.     Average METs -- 2.56 2.54           Resistance Training   Training Prescription -- Yes Yes     Weight -- 5 lb 5 lb     Reps -- 10-15 10-15           Interval Training   Interval Training -- -- No           Treadmill   MPH -- --  2.6     Grade -- -- 1.5     Minutes -- -- 15     METs -- -- 3.53           Recumbant Bike   Level -- 2 --     RPM -- 60 --     Watts -- 30 --     Minutes -- 15 --     METs -- 2.82 --           NuStep   Level -- 2 --     SPM -- 80 --     Minutes -- 15 --     METs -- 2.3 --           Recumbant Elliptical   Level -- -- 2     Minutes -- -- 15     METs -- -- 1.9           REL-XR   Level -- -- 2     Minutes -- -- 15     METs -- -- 2.2            Exercise Comments:   Exercise Goals and Review:   Exercise Goals    Row Name 02/19/21 1140             Exercise Goals   Increase Physical Activity Yes       Intervention Provide advice, education, support and counseling about physical activity/exercise needs.;Develop an individualized exercise prescription for aerobic and resistive training based on initial evaluation findings, risk stratification, comorbidities and participant's personal goals.       Expected Outcomes Short Term: Attend rehab on a regular basis to increase amount of physical activity.;Long Term: Add in home exercise to make exercise part of routine and to increase amount of physical activity.;Long Term: Exercising regularly at least 3-5 days a week.       Increase Strength and Stamina Yes       Intervention Provide advice, education, support and counseling about physical activity/exercise needs.;Develop an individualized exercise prescription for aerobic and resistive training based on initial evaluation findings, risk stratification, comorbidities and participant's personal goals.       Expected Outcomes Short Term: Increase workloads from initial exercise prescription for resistance, speed, and METs.;Short Term: Perform resistance training exercises routinely during rehab and add in resistance training at home;Long Term: Improve cardiorespiratory fitness, muscular endurance and strength as measured by increased METs and functional capacity (6MWT)       Able to understand and use rate of perceived exertion (RPE) scale Yes       Intervention Provide education and explanation on how to use RPE scale       Expected Outcomes Short Term: Able to use RPE daily in rehab to express subjective intensity level;Long Term:  Able to use RPE to guide intensity level when exercising independently       Able to understand and use Dyspnea scale Yes       Intervention Provide education and explanation on how to use Dyspnea scale       Expected Outcomes Short Term: Able to use Dyspnea scale daily  in rehab to express subjective sense of shortness of breath during exertion;Long Term: Able to use Dyspnea scale to guide intensity level when exercising independently       Knowledge and understanding of Target Heart Rate Range (THRR) Yes       Intervention Provide education and explanation of THRR including how the numbers were predicted  and where they are located for reference       Expected Outcomes Short Term: Able to state/look up THRR;Short Term: Able to use daily as guideline for intensity in rehab;Long Term: Able to use THRR to govern intensity when exercising independently       Able to check pulse independently Yes       Intervention Provide education and demonstration on how to check pulse in carotid and radial arteries.;Review the importance of being able to check your own pulse for safety during independent exercise       Expected Outcomes Short Term: Able to explain why pulse checking is important during independent exercise;Long Term: Able to check pulse independently and accurately       Understanding of Exercise Prescription Yes       Intervention Provide education, explanation, and written materials on patient's individual exercise prescription       Expected Outcomes Short Term: Able to explain program exercise prescription;Long Term: Able to explain home exercise prescription to exercise independently              Exercise Goals Re-Evaluation :  Exercise Goals Re-Evaluation    Row Name 02/22/21 1059 02/25/21 1218 03/11/21 0821         Exercise Goal Re-Evaluation   Exercise Goals Review Increase Physical Activity;Able to understand and use rate of perceived exertion (RPE) scale;Knowledge and understanding of Target Heart Rate Range (THRR);Understanding of Exercise Prescription;Increase Strength and Stamina;Able to understand and use Dyspnea scale;Able to check pulse independently Increase Physical Activity;Increase Strength and Stamina Increase Physical Activity;Increase  Strength and Stamina;Understanding of Exercise Prescription     Comments Reviewed RPE and dyspnea scales, THR and program prescription with pt today.  Pt voiced understanding and was given a copy of goals to take home. Tim Thomas has done well in his first days of exercise.  Staff will monitor progress. Tim Thomas is doing well in rehab.  He is now up to 2.6 mph on the treadmill at 1.5% grade.  He is also using 5lb weights.  We will continue to montior his progress.     Expected Outcomes Short: Use RPE daily to regulate intensity. Long: Follow program prescription in THR. Short:  attend consistently Long:  improve overall stamina Short: Continue to improve on METs Long: Continue to improve stamina.            Discharge Exercise Prescription (Final Exercise Prescription Changes):  Exercise Prescription Changes - 03/11/21 0800      Response to Exercise   Blood Pressure (Admit) 108/60    Blood Pressure (Exercise) 152/84    Blood Pressure (Exit) 126/64    Heart Rate (Admit) 87 bpm    Heart Rate (Exercise) 96 bpm    Heart Rate (Exit) 88 bpm    Oxygen Saturation (Admit) 96 %    Oxygen Saturation (Exercise) 95 %    Oxygen Saturation (Exit) 96 %    Rating of Perceived Exertion (Exercise) 12    Perceived Dyspnea (Exercise) 2    Symptoms none    Duration Continue with 30 min of aerobic exercise without signs/symptoms of physical distress.    Intensity THRR unchanged      Progression   Progression Continue to progress workloads to maintain intensity without signs/symptoms of physical distress.    Average METs 2.54      Resistance Training   Training Prescription Yes    Weight 5 lb    Reps 10-15      Interval Training  Interval Training No      Treadmill   MPH 2.6    Grade 1.5    Minutes 15    METs 3.53      Recumbant Elliptical   Level 2    Minutes 15    METs 1.9      REL-XR   Level 2    Minutes 15    METs 2.2           Nutrition:  Target Goals: Understanding of nutrition  guidelines, daily intake of sodium 1500mg , cholesterol 200mg , calories 30% from fat and 7% or less from saturated fats, daily to have 5 or more servings of fruits and vegetables.  Education: All About Nutrition: -Group instruction provided by verbal, written material, interactive activities, discussions, models, and posters to present general guidelines for heart healthy nutrition including fat, fiber, MyPlate, the role of sodium in heart healthy nutrition, utilization of the nutrition label, and utilization of this knowledge for meal planning. Follow up email sent as well. Written material given at graduation.   Biometrics:  Pre Biometrics - 02/19/21 1140      Pre Biometrics   Height 6' 0.5" (1.842 m)    Weight 251 lb 6.4 oz (114 kg)    BMI (Calculated) 33.61    Single Leg Stand 5 seconds            Nutrition Therapy Plan and Nutrition Goals:   Nutrition Assessments:  MEDIFICTS Score Key:  ?70 Need to make dietary changes   40-70 Heart Healthy Diet  ? 40 Therapeutic Level Cholesterol Diet  Flowsheet Row Pulmonary Rehab from 02/19/2021 in [facility] Cardiac and Pulmonary Rehab  Picture Your Plate Total Score on Admission 44     Picture Your Plate Scores:  <10 Unhealthy dietary pattern with much room for improvement.  41-50 Dietary pattern unlikely to meet recommendations for good health and room for improvement.  51-60 More healthful dietary pattern, with some room for improvement.   >60 Healthy dietary pattern, although there may be some specific behaviors that could be improved.   Nutrition Goals Re-Evaluation:   Nutrition Goals Discharge (Final Nutrition Goals Re-Evaluation):   Psychosocial: Target Goals: Acknowledge presence or absence of significant depression and/or stress, maximize coping skills, provide positive support system. Participant is able to verbalize types and ability to use techniques and skills needed for reducing stress and depression.    Education: Stress, Anxiety, and Depression - Group verbal and visual presentation to define topics covered.  Reviews how body is impacted by stress, anxiety, and depression.  Also discusses healthy ways to reduce stress and to treat/manage anxiety and depression.  Written material given at graduation.   Education: Sleep Hygiene -Provides group verbal and written instruction about how sleep can affect your health.  Define sleep hygiene, discuss sleep cycles and impact of sleep habits. Review good sleep hygiene tips.    Initial Review & Psychosocial Screening:  Initial Psych Review & Screening - 02/12/21 0941      Initial Review   Current issues with None Identified      Family Dynamics   Good Support System? Yes    Comments He can look to his wife of 50 years and daughter. He has two other children who he talkes to.      Barriers   Psychosocial barriers to participate in program There are no identifiable barriers or psychosocial needs.;The patient should benefit from training in stress management and relaxation.      Screening Interventions  Interventions Encouraged to exercise;To provide support and resources with identified psychosocial needs;Provide feedback about the scores to participant           Quality of Life Scores:  Scores of 19 and below usually indicate a poorer quality of life in these areas.  A difference of  2-3 points is a clinically meaningful difference.  A difference of 2-3 points in the total score of the Quality of Life Index has been associated with significant improvement in overall quality of life, self-image, physical symptoms, and general health in studies assessing change in quality of life.  PHQ-9: Recent Review Flowsheet Data    Depression screen [facility] 2/9 02/19/2021   Decreased Interest 2   Down, Depressed, Hopeless 0   PHQ - 2 Score 2   Altered sleeping 2   Tired, decreased energy 2   Change in appetite 3   Feeling bad or failure about yourself   0   Trouble concentrating 1   Moving slowly or fidgety/restless 1   Suicidal thoughts 0   PHQ-9 Score 11   Difficult doing work/chores Somewhat difficult     Interpretation of Total Score  Total Score Depression Severity:  1-4 = Minimal depression, 5-9 = Mild depression, 10-14 = Moderate depression, 15-19 = Moderately severe depression, 20-27 = Severe depression   Psychosocial Evaluation and Intervention:  Psychosocial Evaluation - 02/12/21 0942      Psychosocial Evaluation & Interventions   Interventions Encouraged to exercise with the program and follow exercise prescription;Relaxation education;Stress management education    Comments He can look to his wife of 50 years and daughter. He has two other children who he talkes to.    Expected Outcomes Short: Exercise regularly to support mental health and notify staff of any changes. Long: maintain mental health and well being through teaching of rehab or prescribed medications independently.    Continue Psychosocial Services  Follow up required by staff           Psychosocial Re-Evaluation:   Psychosocial Discharge (Final Psychosocial Re-Evaluation):   Education: Education Goals: Education classes will be provided on a weekly basis, covering required topics. Participant will state understanding/return demonstration of topics presented.  Learning Barriers/Preferences:  Learning Barriers/Preferences - 02/12/21 1610      Learning Barriers/Preferences   Learning Barriers Hearing    Learning Preferences None           General Pulmonary Education Topics:  Infection Prevention: - Provides verbal and written material to individual with discussion of infection control including proper hand washing and proper equipment cleaning during exercise session. Flowsheet Row Pulmonary Rehab from 03/06/2021 in [facility] Cardiac and Pulmonary Rehab  Date 02/12/21  Educator [facility]  Instruction Review Code 1- Verbalizes Understanding       Falls Prevention: - Provides verbal and written material to individual with discussion of falls prevention and safety. Flowsheet Row Pulmonary Rehab from 03/06/2021 in [facility] Cardiac and Pulmonary Rehab  Date 02/12/21  Educator [facility]  Instruction Review Code 1- Verbalizes Understanding      Chronic Lung Disease Review: - Group verbal instruction with posters, models, PowerPoint presentations and videos,  to review new updates, new respiratory medications, new advancements in procedures and treatments. Providing information on websites and "800" numbers for continued self-education. Includes information about supplement oxygen, available portable oxygen systems, continuous and intermittent flow rates, oxygen safety, concentrators, and Medicare reimbursement for oxygen. Explanation of Pulmonary Drugs, including class, frequency, complications, importance of spacers, rinsing mouth after steroid MDI's, and proper  cleaning methods for nebulizers. Review of basic lung anatomy and physiology related to function, structure, and complications of lung disease. Review of risk factors. Discussion about methods for diagnosing sleep apnea and types of masks and machines for OSA. Includes a review of the use of types of environmental controls: home humidity, furnaces, filters, dust mite/pet prevention, HEPA vacuums. Discussion about weather changes, air quality and the benefits of nasal washing. Instruction on Warning signs, infection symptoms, calling MD promptly, preventive modes, and value of vaccinations. Review of effective airway clearance, coughing and/or vibration techniques. Emphasizing that all should Create an Action Plan. Written material given at graduation. Flowsheet Row Pulmonary Rehab from 03/06/2021 in [facility] Cardiac and Pulmonary Rehab  Education need identified 02/19/21      AED/CPR: - Group verbal and written instruction with the use of models to demonstrate the basic use of the AED with the basic  ABC's of resuscitation.    Anatomy and Cardiac Procedures: - Group verbal and visual presentation and models provide information about basic cardiac anatomy and function. Reviews the testing methods done to diagnose heart disease and the outcomes of the test results. Describes the treatment choices: Medical Management, Angioplasty, or Coronary Bypass Surgery for treating various heart conditions including Myocardial Infarction, Angina, Valve Disease, and Cardiac Arrhythmias.  Written material given at graduation.   Medication Safety: - Group verbal and visual instruction to review commonly prescribed medications for heart and lung disease. Reviews the medication, class of the drug, and side effects. Includes the steps to properly store meds and maintain the prescription regimen.  Written material given at graduation. Flowsheet Row Pulmonary Rehab from 03/06/2021 in [facility] Cardiac and Pulmonary Rehab  Date 03/06/21  Educator [facility]  Instruction Review Code 1- Verbalizes Understanding      Other: -Provides group and verbal instruction on various topics (see comments)   Knowledge Questionnaire Score:  Knowledge Questionnaire Score - 02/19/21 1141      Knowledge Questionnaire Score   Pre Score 16/18 Education Focus: O2 safety            Core Components/Risk Factors/Patient Goals at Admission:  Personal Goals and Risk Factors at Admission - 02/19/21 1142      Core Components/Risk Factors/Patient Goals on Admission    Weight Management Yes;Weight Loss;Obesity    Intervention Weight Management: Develop a combined nutrition and exercise program designed to reach desired caloric intake, while maintaining appropriate intake of nutrient and fiber, sodium and fats, and appropriate energy expenditure required for the weight goal.;Weight Management/Obesity: Establish reasonable short term and long term weight goals.;Obesity: Provide education and appropriate resources to help participant work on and  attain dietary goals.;Weight Management: Provide education and appropriate resources to help participant work on and attain dietary goals.    Admit Weight 251 lb 6.4 oz (114 kg)    Goal Weight: Short Term 246 lb (111.6 kg)    Goal Weight: Long Term 240 lb (108.9 kg)    Expected Outcomes Short Term: Continue to assess and modify interventions until short term weight is achieved;Long Term: Adherence to nutrition and physical activity/exercise program aimed toward attainment of established weight goal;Weight Loss: Understanding of general recommendations for a balanced deficit meal plan, which promotes 1-2 lb weight loss per week and includes a negative energy balance of [phone] kcal/d;Understanding recommendations for meals to include 15-35% energy as protein, 25-35% energy from fat, 35-60% energy from carbohydrates, less than  of dietary cholesterol, 20-35 gm of total fiber daily;Understanding of distribution of calorie intake throughout  the day with the consumption of 4-5 meals/snacks    Improve shortness of breath with ADL's Yes    Intervention Provide education, individualized exercise plan and daily activity instruction to help decrease symptoms of SOB with activities of daily living.    Expected Outcomes Long Term: Be able to perform more ADLs without symptoms or delay the onset of symptoms;Short Term: Improve cardiorespiratory fitness to achieve a reduction of symptoms when performing ADLs    Hypertension Yes    Intervention Provide education on lifestyle modifcations including regular physical activity/exercise, weight management, moderate sodium restriction and increased consumption of fresh fruit, vegetables, and low fat dairy, alcohol moderation, and smoking cessation.;Monitor prescription use compliance.    Expected Outcomes Short Term: Continued assessment and intervention until BP is < 140/58mm HG in hypertensive participants. < 130/71mm HG in hypertensive participants with diabetes,  heart failure or chronic kidney disease.;Long Term: Maintenance of blood pressure at goal levels.    Lipids Yes    Intervention Provide education and support for participant on nutrition & aerobic/resistive exercise along with prescribed medications to achieve LDL 70mg , HDL >40mg .    Expected Outcomes Short Term: Participant states understanding of desired cholesterol values and is compliant with medications prescribed. Participant is following exercise prescription and nutrition guidelines.;Long Term: Cholesterol controlled with medications as prescribed, with individualized exercise RX and with personalized nutrition plan. Value goals: LDL < , HDL > 40 mg.           Education:Diabetes - Individual verbal and written instruction to review signs/symptoms of diabetes, desired ranges of glucose level fasting, after meals and with exercise. Acknowledge that pre and post exercise glucose checks will be done for 3 sessions at entry of program.   Know Your Numbers and Heart Failure: - Group verbal and visual instruction to discuss disease risk factors for cardiac and pulmonary disease and treatment options.  Reviews associated critical values for Overweight/Obesity, Hypertension, Cholesterol, and Diabetes.  Discusses basics of heart failure: signs/symptoms and treatments.  Introduces Heart Failure Zone chart for action plan for heart failure.  Written material given at graduation.   Core Components/Risk Factors/Patient Goals Review:    Core Components/Risk Factors/Patient Goals at Discharge (Final Review):    ITP Comments:  ITP Comments    Row Name 02/12/21 0939 02/19/21 1134 02/22/21 1059 03/11/21 0821 03/13/21 0702   ITP Comments Virtual Visit completed. Patient informed on EP and RD appointment and 6 Minute walk test. Patient also informed of patient health questionnaires on My Chart. Patient Verbalizes understanding. Visit diagnosis can be found in [facility] 01/25/2021. Completed and gym  orientation. Initial ITP created and sent for review to Dr. Bethann Punches, Medical Director. First full day of exercise!  Patient was oriented to gym and equipment including functions, settings, policies, and procedures.  Patient's individual exercise prescription and treatment plan were reviewed.  All starting workloads were established based on the results of the 6 minute walk test done at initial orientation visit.  The plan for exercise progression was also introduced and progression will be customized based on patient's performance and goals. Tim Thomas called out today with a bad reaction to something he ate(?) and had to take benedryl and not feeling well enough to come into class today. 30 Day review completed. Medical Director ITP review done, changes made as directed, and signed approval by Medical Director.          Comments:

## 2021-03-15 ENCOUNTER — Encounter: Payer: Medicare Other | Admitting: *Deleted

## 2021-03-15 ENCOUNTER — Other Ambulatory Visit: Payer: Self-pay

## 2021-03-15 DIAGNOSIS — J449 Chronic obstructive pulmonary disease, unspecified: Secondary | ICD-10-CM

## 2021-03-15 NOTE — Progress Notes (Signed)
Daily Session Note  Patient Details  Name: Jionni Helming MRN: [id number] Date of Birth: February 13, 1951 Referring Provider:   Flowsheet Row Pulmonary Rehab from 02/19/2021 in [facility] Cardiac and Pulmonary Rehab  Referring Provider Truitt Merle MD      Encounter Date: 03/15/2021  Check In:  Session Check In - 03/15/21 1118      Check-In   Supervising physician immediately available to respond to emergencies See telemetry face sheet for immediately available ER MD    Location [facility]-Cardiac & Pulmonary Rehab    Staff Present Renita Papa, RN BSN;Joseph [address] [place], [place], RCEP, CCRP, CCET    Virtual Visit No    Medication changes reported     No    Fall or balance concerns reported    No    Warm-up and Cool-down Performed on first and last piece of equipment    Resistance Training Performed Yes    VAD Patient? No    PAD/SET Patient? No      Pain Assessment   Currently in Pain? No/denies              Social History   Tobacco Use  Smoking Status Former Smoker  . Packs/day: 1.50  . Years: 53.00  . Pack years: 79.50  . Types: Cigarettes  . Quit date: 03/04/2019  . Years since quitting: 2.0  Smokeless Tobacco Never Used    Goals Met:  Independence with exercise equipment Exercise tolerated well No report of cardiac concerns or symptoms Strength training completed today  Goals Unmet:  Not Applicable  Comments: Pt able to follow exercise prescription today without complaint.  Will continue to monitor for progression.    Dr. Emily Filbert is Medical Director for [place] and [facility].

## 2021-03-18 ENCOUNTER — Other Ambulatory Visit: Payer: Self-pay

## 2021-03-18 ENCOUNTER — Encounter: Payer: Medicare Other | Admitting: *Deleted

## 2021-03-18 DIAGNOSIS — J449 Chronic obstructive pulmonary disease, unspecified: Secondary | ICD-10-CM

## 2021-03-18 NOTE — Progress Notes (Signed)
Daily Session Note  Patient Details  Name: Altariq Goodall MRN: [id number] Date of Birth: [birth year] Referring Provider:   Flowsheet Row Pulmonary Rehab from 02/19/2021 in [facility] Cardiac and Pulmonary Rehab  Referring Provider Truitt Merle MD      Encounter Date: 03/18/2021  Check In:  Session Check In - 03/18/21 1110      Check-In   Supervising physician immediately available to respond to emergencies See telemetry face sheet for immediately available ER MD    Location [facility]-Cardiac & Pulmonary Rehab    Staff Present Renita Papa, RN BSN;Joseph Tedd Sias, [place], ACSM CEP, Exercise Physiologist;Kelly Rosalia Hammers, MPA, RN    Virtual Visit No    Medication changes reported     No    Fall or balance concerns reported    No    Warm-up and Cool-down Performed on first and last piece of equipment    Resistance Training Performed Yes    VAD Patient? No    PAD/SET Patient? No      Pain Assessment   Currently in Pain? No/denies              Social History   Tobacco Use  Smoking Status Former Smoker  . Packs/day: 1.50  . Years: 53.00  . Pack years: 79.50  . Types: Cigarettes  . Quit date: 03/04/2019  . Years since quitting: 2.0  Smokeless Tobacco Never Used    Goals Met:  Independence with exercise equipment Exercise tolerated well No report of cardiac concerns or symptoms Strength training completed today  Goals Unmet:  Not Applicable  Comments: Pt able to follow exercise prescription today without complaint.  Will continue to monitor for progression.    Dr. Emily Filbert is Medical Director for [place] and [facility].

## 2021-03-20 ENCOUNTER — Other Ambulatory Visit: Payer: Self-pay

## 2021-03-20 DIAGNOSIS — J449 Chronic obstructive pulmonary disease, unspecified: Secondary | ICD-10-CM

## 2021-03-20 NOTE — Progress Notes (Signed)
Daily Session Note  Patient Details  Name: Tim Thomas MRN: [id number] Date of Birth: 1951/01/24 Referring Provider:   Flowsheet Row Pulmonary Rehab from 02/19/2021 in [facility] Cardiac and Pulmonary Rehab  Referring Provider Truitt Merle MD      Encounter Date: 03/20/2021  Check In:  Session Check In - 03/20/21 1030      Check-In   Supervising physician immediately available to respond to emergencies See telemetry face sheet for immediately available ER MD    Location [facility]-Cardiac & Pulmonary Rehab    Staff Present Birdie Sons, MPA, RN;Melissa Caiola RDN, Rowe Pavy, BA, ACSM CEP, Exercise Physiologist    Virtual Visit No    Medication changes reported     No    Fall or balance concerns reported    No    Tobacco Cessation No Change    Warm-up and Cool-down Performed on first and last piece of equipment    Resistance Training Performed Yes    VAD Patient? No    PAD/SET Patient? No      Pain Assessment   Currently in Pain? No/denies              Social History   Tobacco Use  Smoking Status Former Smoker  . Packs/day: 1.50  . Years: 53.00  . Pack years: 79.50  . Types: Cigarettes  . Quit date: 03/04/2019  . Years since quitting: 2.0  Smokeless Tobacco Never Used    Goals Met:  Independence with exercise equipment Exercise tolerated well No report of cardiac concerns or symptoms Strength training completed today  Goals Unmet:  Not Applicable  Comments: Pt able to follow exercise prescription today without complaint.  Will continue to monitor for progression.    Dr. Emily Filbert is Medical Director for [place] and [facility].

## 2021-03-25 ENCOUNTER — Encounter: Payer: Medicare Other | Attending: Pulmonary Disease | Admitting: *Deleted

## 2021-03-25 ENCOUNTER — Other Ambulatory Visit: Payer: Self-pay

## 2021-03-25 DIAGNOSIS — J449 Chronic obstructive pulmonary disease, unspecified: Secondary | ICD-10-CM | POA: Insufficient documentation

## 2021-03-25 NOTE — Progress Notes (Signed)
Daily Session Note  Patient Details  Name: Tim Thomas MRN: [id number] Date of Birth: 11/03/1951 Referring Provider:   Flowsheet Row Pulmonary Rehab from 02/19/2021 in [facility] Cardiac and Pulmonary Rehab  Referring Provider Truitt Merle MD      Encounter Date: 03/25/2021  Check In:  Session Check In - 03/25/21 1057      Check-In   Supervising physician immediately available to respond to emergencies See telemetry face sheet for immediately available ER MD    Location [facility]-Cardiac & Pulmonary Rehab    Staff Present Renita Papa, RN BSN;Joseph [address] [place], MPA, Mauricia Area, BS, ACSM CEP, Exercise Physiologist    Virtual Visit No    Medication changes reported     No    Fall or balance concerns reported    No    Warm-up and Cool-down Performed on first and last piece of equipment    Resistance Training Performed Yes    VAD Patient? No    PAD/SET Patient? No      Pain Assessment   Currently in Pain? No/denies              Social History   Tobacco Use  Smoking Status Former Smoker  . Packs/day: 1.50  . Years: 53.00  . Pack years: 79.50  . Types: Cigarettes  . Quit date: 03/04/2019  . Years since quitting: 2.0  Smokeless Tobacco Never Used    Goals Met:  Independence with exercise equipment Exercise tolerated well No report of cardiac concerns or symptoms Strength training completed today  Goals Unmet:  Not Applicable  Comments: Pt able to follow exercise prescription today without complaint.  Will continue to monitor for progression.    Dr. Emily Filbert is Medical Director for [place] and [facility].

## 2021-03-27 ENCOUNTER — Other Ambulatory Visit: Payer: Self-pay

## 2021-03-27 DIAGNOSIS — J449 Chronic obstructive pulmonary disease, unspecified: Secondary | ICD-10-CM | POA: Diagnosis not present

## 2021-03-27 NOTE — Progress Notes (Signed)
Daily Session Note  Patient Details  Name: Tim Thomas MRN: [id number] Date of Birth: 12-18-1951 Referring Provider:   Flowsheet Row Pulmonary Rehab from 02/19/2021 in [facility] Cardiac and Pulmonary Rehab  Referring Provider Truitt Merle MD      Encounter Date: 03/27/2021  Check In:  Session Check In - 03/27/21 1024      Check-In   Supervising physician immediately available to respond to emergencies See telemetry face sheet for immediately available ER MD    Location [facility]-Cardiac & Pulmonary Rehab    Staff Present Birdie Sons, MPA, RN;Amanda Sommer, BA, ACSM CEP, Exercise Physiologist;Joseph Tessie Fass RCP,RRT,BSRT    Virtual Visit No    Medication changes reported     No    Fall or balance concerns reported    No    Tobacco Cessation No Change    Warm-up and Cool-down Performed on first and last piece of equipment    Resistance Training Performed Yes    VAD Patient? No    PAD/SET Patient? No      Pain Assessment   Currently in Pain? No/denies              Social History   Tobacco Use  Smoking Status Former Smoker  . Packs/day: 1.50  . Years: 53.00  . Pack years: 79.50  . Types: Cigarettes  . Quit date: 03/04/2019  . Years since quitting: 2.0  Smokeless Tobacco Never Used    Goals Met:  Independence with exercise equipment Exercise tolerated well Personal goals reviewed No report of cardiac concerns or symptoms Strength training completed today  Goals Unmet:  Not Applicable  Comments: Pt able to follow exercise prescription today without complaint.  Will continue to monitor for progression.    Dr. Emily Filbert is Medical Director for [place] and [facility].

## 2021-03-29 ENCOUNTER — Encounter: Payer: Medicare Other | Admitting: *Deleted

## 2021-03-29 ENCOUNTER — Other Ambulatory Visit: Payer: Self-pay

## 2021-03-29 DIAGNOSIS — J449 Chronic obstructive pulmonary disease, unspecified: Secondary | ICD-10-CM | POA: Diagnosis not present

## 2021-03-29 NOTE — Progress Notes (Signed)
Daily Session Note  Patient Details  Name: Tim Thomas MRN: [id number] Date of Birth: 1951-04-04 Referring Provider:   Flowsheet Row Pulmonary Rehab from 02/19/2021 in [facility] Cardiac and Pulmonary Rehab  Referring Provider Truitt Merle MD      Encounter Date: 03/29/2021  Check In:  Session Check In - 03/29/21 1104      Check-In   Supervising physician immediately available to respond to emergencies See telemetry face sheet for immediately available ER MD    Location [facility]-Cardiac & Pulmonary Rehab    Staff Present Renita Papa, RN BSN;Joseph [address] [place], [place], RCEP, CCRP, CCET    Virtual Visit No    Medication changes reported     No    Fall or balance concerns reported    No    Warm-up and Cool-down Performed on first and last piece of equipment    Resistance Training Performed Yes    VAD Patient? No    PAD/SET Patient? No      Pain Assessment   Currently in Pain? No/denies              Social History   Tobacco Use  Smoking Status Former Smoker  . Packs/day: 1.50  . Years: 53.00  . Pack years: 79.50  . Types: Cigarettes  . Quit date: 03/04/2019  . Years since quitting: 2.0  Smokeless Tobacco Never Used    Goals Met:  Independence with exercise equipment Exercise tolerated well No report of cardiac concerns or symptoms Strength training completed today  Goals Unmet:  Not Applicable  Comments: Pt able to follow exercise prescription today without complaint.  Will continue to monitor for progression.    Dr. Emily Filbert is Medical Director for [place] and [facility].

## 2021-04-01 ENCOUNTER — Other Ambulatory Visit: Payer: Self-pay

## 2021-04-01 ENCOUNTER — Encounter: Payer: Medicare Other | Admitting: *Deleted

## 2021-04-01 DIAGNOSIS — J449 Chronic obstructive pulmonary disease, unspecified: Secondary | ICD-10-CM

## 2021-04-01 NOTE — Progress Notes (Signed)
Daily Session Note  Patient Details  Name: Tim Thomas MRN: [id number] Date of Birth: [birth year] Referring Provider:   Flowsheet Row Pulmonary Rehab from 02/19/2021 in [facility] Cardiac and Pulmonary Rehab  Referring Provider Truitt Merle MD      Encounter Date: 04/01/2021  Check In:  Session Check In - 04/01/21 1110      Check-In   Supervising physician immediately available to respond to emergencies See telemetry face sheet for immediately available ER MD    Location [facility]-Cardiac & Pulmonary Rehab    Staff Present Renita Papa, RN BSN;Joseph Tedd Sias, [place], ACSM CEP, Exercise Physiologist;Kelly Rosalia Hammers, MPA, RN    Virtual Visit No    Medication changes reported     No    Fall or balance concerns reported    No    Tobacco Cessation No Change    Warm-up and Cool-down Performed on first and last piece of equipment    Resistance Training Performed Yes    VAD Patient? No    PAD/SET Patient? No      Pain Assessment   Currently in Pain? No/denies              Social History   Tobacco Use  Smoking Status Former Smoker  . Packs/day: 1.50  . Years: 53.00  . Pack years: 79.50  . Types: Cigarettes  . Quit date: 03/04/2019  . Years since quitting: 2.0  Smokeless Tobacco Never Used    Goals Met:  Independence with exercise equipment Exercise tolerated well No report of cardiac concerns or symptoms Strength training completed today  Goals Unmet:  Not Applicable  Comments: Pt able to follow exercise prescription today without complaint.  Will continue to monitor for progression.    Dr. Emily Filbert is Medical Director for [place] and [facility].

## 2021-04-05 ENCOUNTER — Other Ambulatory Visit: Payer: Self-pay

## 2021-04-05 ENCOUNTER — Encounter: Payer: Medicare Other | Admitting: *Deleted

## 2021-04-05 DIAGNOSIS — J449 Chronic obstructive pulmonary disease, unspecified: Secondary | ICD-10-CM

## 2021-04-05 NOTE — Progress Notes (Signed)
Daily Session Note  Patient Details  Name: Tim Thomas MRN: [id number] Date of Birth: 03/22/1951 Referring Provider:   Flowsheet Row Pulmonary Rehab from 02/19/2021 in [facility] Cardiac and Pul[facility] Rehab  Referring Provider Truitt Merle MD      Encounter Date: 04/05/2021  Check In:  Session Check In - 04/05/21 1058      Check-In   Supervising physician immediately available to respond to emergencies See telemetry face sheet for immediately available ER MD    Location [facility]-Cardiac & Pulmonary Rehab    Staff Present Renita Papa, RN BSN;Joseph Hood RCP,RRT,BSRT;Melissa [place] RDN, LDN    Virtual Visit No    Medication changes reported     No    Fall or balance concerns reported    No    Tobacco Cessation No Change    Warm-up and Cool-down Performed on first and last piece of equipment    Resistance Training Performed Yes    VAD Patient? No    PAD/SET Patient? No      Pain Assessment   Currently in Pain? No/denies              Social History   Tobacco Use  Smoking Status Former Smoker  . Packs/day: 1.50  . Years: 53.00  . Pack years: 79.50  . Types: Cigarettes  . Quit date: 03/04/2019  . Years since quitting: 2.0  Smokeless Tobacco Never Used    Goals Met:  Independence with exercise equipment Exercise tolerated well No report of cardiac concerns or symptoms Strength training completed today  Goals Unmet:  Not Applicable  Comments: Pt able to follow exercise prescription today without complaint.  Will continue to monitor for progression.    Dr. Emily Filbert is Medical Director for [facility].

## 2021-04-08 ENCOUNTER — Encounter: Payer: Medicare Other | Admitting: *Deleted

## 2021-04-08 ENCOUNTER — Other Ambulatory Visit: Payer: Self-pay

## 2021-04-08 DIAGNOSIS — J449 Chronic obstructive pulmonary disease, unspecified: Secondary | ICD-10-CM | POA: Diagnosis not present

## 2021-04-08 NOTE — Progress Notes (Signed)
Daily Session Note  Patient Details  Name: Tim Thomas MRN: [id number] Date of Birth: [birth year] Referring Provider:   Flowsheet Row Pulmonary Rehab from 02/19/2021 in [facility] Re[facility]  Referring Provider Truitt Merle MD      Encounter Date: 04/08/2021  Check In:  Session Check In - 04/08/21 1112      Check-In   Supervising physician immediately available to respond to emergencies See telemetry face sheet for immediately available ER MD    Location [facility]-Cardiac & Pulmonary Rehab    Staff Present Renita Papa, RN BSN;Joseph [address] [place], [place], ACSM CEP, Exercise Physiologist;Melissa Caiola RDN, LDN    Virtual Visit No    Medication changes reported     No    Fall or balance concerns reported    No    Warm-up and Cool-down Performed on first and last piece of equipment    Resistance Training Performed Yes    VAD Patient? No    PAD/SET Patient? No      Pain Assessment   Currently in Pain? No/denies              Social History   Tobacco Use  Smoking Status Former Smoker  . Packs/day: 1.50  . Years: 53.00  . Pack years: 79.50  . Types: Cigarettes  . Quit date: 03/04/2019  . Years since quitting: 2.0  Smokeless Tobacco Never Used    Goals Met:  Independence with exercise equipment Exercise tolerated well No report of cardiac concerns or symptoms Strength training completed today  Goals Unmet:  Not Applicable  Comments: Pt able to follow exercise prescription today without complaint.  Will continue to monitor for progression.    Dr. Emily Filbert is Medical Director for [facility].

## 2021-04-10 ENCOUNTER — Encounter: Payer: Self-pay | Admitting: *Deleted

## 2021-04-10 DIAGNOSIS — J449 Chronic obstructive pulmonary disease, unspecified: Secondary | ICD-10-CM

## 2021-04-10 NOTE — Progress Notes (Signed)
Pulmonary Individual Treatment Plan  Patient Details  Name: Tim Thomas MRN: [id number] Date of Birth: Sep 24, 1951 Referring Provider:   Flowsheet Row Pulmonary Rehab from 02/19/2021 in [facility] Cardiac and Pulmonary Rehab  Referring Provider Daiva Huge MD      Initial Encounter Date:  Flowsheet Row Pulmonary Rehab from 02/19/2021 in [facility] Cardiac and Pulmonary Rehab  Date 02/19/21      Visit Diagnosis: COPD with asthma (HCC)  Patient's Home Medications on Admission:  Current Outpatient Medications:  .  acetaminophen (TYLENOL) 500 MG tablet, Take 500 mg by mouth every 6 (six) hours as needed for moderate pain., Disp: , Rfl:  .  albuterol (PROAIR HFA) 108 (90 Base) MCG/ACT inhaler, Inhale 2 puffs into the lungs every 6 (six) hours as needed for wheezing or shortness of breath., Disp: 18 g, Rfl: 2 .  amLODipine (NORVASC) 10 MG tablet, Take 10 mg by mouth daily. , Disp: , Rfl:  .  azelastine (ASTELIN) 0.1 % nasal spray, Place 2 sprays into both nostrils 2 (two) times daily., Disp: 30 mL, Rfl: 5 .  cetirizine (ZYRTEC) 10 MG tablet, Take 10 mg by mouth daily. As needed, Disp: , Rfl:  .  EPINEPHrine 0.3 mg/0.3 mL IJ SOAJ injection, Inject 0.3 mLs (0.3 mg total) into the muscle once. (Patient taking differently: Inject 0.3 mg into the muscle as needed for anaphylaxis.), Disp: 2 Device, Rfl: 1 .  ipratropium (ATROVENT) 0.06 % nasal spray, Place 2 sprays into both nostrils 3 (three) times daily as needed (nasal drainage)., Disp: 15 mL, Rfl: 5 .  linaclotide (LINZESS) 145 MCG CAPS capsule, Take 145 mcg by mouth daily as needed., Disp: , Rfl:  .  losartan (COZAAR) 100 MG tablet, Take 100 mg by mouth daily., Disp: , Rfl:  .  minocycline (DYNACIN) 100 MG tablet, Take 100 mg by mouth 2 (two) times daily., Disp: , Rfl:  .  Minocycline HCl 1 MG MISC, , Disp: , Rfl:  .  montelukast (SINGULAIR) 10 MG tablet, TAKE 1 TABLET BY MOUTH EVERYDAY AT BEDTIME, Disp: 90 tablet, Rfl: 3 .  NEVANAC 0.1 % ophthalmic  suspension, SMARTSIG:In Eye(s), Disp: , Rfl:  .  rosuvastatin (CRESTOR) 20 MG tablet, Take 20 mg by mouth daily. , Disp: , Rfl:  .  sulfamethoxazole-trimethoprim (BACTRIM DS) 800-160 MG tablet, Take 1 tablet by mouth 2 (two) times daily., Disp: , Rfl:  .  TRELEGY ELLIPTA 100-62.5-25 MCG/INH AEPB, INHALE 1 PUFF BY MOUTH EVERY DAY, Disp: 60 each, Rfl: 5  Past Medical History: Past Medical History:  Diagnosis Date  . Anxiety   . BPH (benign prostatic hyperplasia)   . COPD (chronic obstructive pulmonary disease) (HCC)   . 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 Foley catheter in place    02-17-18, occ clots in bag  . High cholesterol   . Hypertension   . Prostate enlargement   . Shoulder pain    right chronic  . Urinary retention     august  2018  . Wet senile macular degeneration (HCC)     right eye current tx, left eye tx in past    Tobacco Use: Social History   Tobacco Use  Smoking Status Former Smoker  . Packs/day: 1.50  . Years: 53.00  . Pack years: 79.50  . Types: Cigarettes  . Quit date: 03/04/2019  . Years since quitting: 2.1  Smokeless Tobacco Never Used    Labs: Recent Review Flowsheet Data   There is no flowsheet data to display.  Pulmonary Assessment Scores:  Pulmonary Assessment Scores    Row Name 02/19/21 1144         ADL UCSD   ADL Phase Entry     SOB Score total 61     Rest 3     Walk 3     Stairs 4     Bath 2     Dress 1     Shop 3           CAT Score   CAT Score 25           mMRC Score   mMRC Score 1            UCSD: Self-administered rating of dyspnea associated with activities of daily living (ADLs) 6-point scale (0 = "not at all" to 5 = "maximal or unable to do because of breathlessness")  Scoring Scores range from 0 to 120.  Minimally important difference is 5 units  CAT: CAT can identify the health impairment of COPD patients and is better correlated with disease progression.  CAT has a scoring range of zero to 40. The  CAT score is classified into four groups of low (less than 10), medium (10 - 20), high (21-30) and very high (31-40) based on the impact level of disease on health status. A CAT score over 10 suggests significant symptoms.  A worsening CAT score could be explained by an exacerbation, poor medication adherence, poor inhaler technique, or progression of COPD or comorbid conditions.  CAT MCID is 2 points  mMRC: mMRC (Modified Medical Research Council) Dyspnea Scale is used to assess the degree of baseline functional disability in patients of respiratory disease due to dyspnea. No minimal important difference is established. A decrease in score of 1 point or greater is considered a positive change.   Pulmonary Function Assessment:  Pulmonary Function Assessment - 02/12/21 0938      Breath   Shortness of Breath Yes;Limiting activity           Exercise Target Goals: Exercise Program Goal: Individual exercise prescription set using results from initial 6 min walk test and THRR while considering  patient's activity barriers and safety.   Exercise Prescription Goal: Initial exercise prescription builds to 30-45 minutes a day of aerobic activity, 2-3 days per week.  Home exercise guidelines will be given to patient during program as part of exercise prescription that the participant will acknowledge.  Education: Aerobic Exercise: - Group verbal and visual presentation on the components of exercise prescription. Introduces F.I.T.T principle from ACSM for exercise prescriptions.  Reviews F.I.T.T. principles of aerobic exercise including progression. Written material given at graduation.   Education: Resistance Exercise: - Group verbal and visual presentation on the components of exercise prescription. Introduces F.I.T.T principle from ACSM for exercise prescriptions  Reviews F.I.T.T. principles of resistance exercise including progression. Written material given at graduation.    Education:  Exercise & Equipment Safety: - Individual verbal instruction and demonstration of equipment use and safety with use of the equipment. Flowsheet Row Pulmonary Rehab from 03/27/2021 in [facility] Cardiac and Pulmonary Rehab  Date 02/12/21  Educator [facility]  Instruction Review Code 1- Verbalizes Understanding      Education: Exercise Physiology & General Exercise Guidelines: - Group verbal and written instruction with models to review the exercise physiology of the cardiovascular system and associated critical values. Provides general exercise guidelines with specific guidelines to those with heart or lung disease.    Education: Flexibility, Balance, Mind/Body Relaxation: - Group verbal  and visual presentation with interactive activity on the components of exercise prescription. Introduces F.I.T.T principle from ACSM for exercise prescriptions. Reviews F.I.T.T. principles of flexibility and balance exercise training including progression. Also discusses the mind body connection.  Reviews various relaxation techniques to help reduce and manage stress (i.e. Deep breathing, progressive muscle relaxation, and visualization). Balance handout provided to take home. Written material given at graduation.   Activity Barriers & Risk Stratification:  Activity Barriers & Cardiac Risk Stratification - 02/19/21 1137      Activity Barriers & Cardiac Risk Stratification   Activity Barriers Neck/Spine Problems;Shortness of Breath;Muscular Weakness;Deconditioning;Balance Concerns;Other (comment)    Comments Hx vertigo           6 Minute Walk:  6 Minute Walk    Row Name 02/19/21 1135         6 Minute Walk   Phase Initial     Distance 1390 feet     Walk Time 6 minutes     # of Rest Breaks 0     MPH 2.63     METS 2.83     RPE 11     Perceived Dyspnea  1     VO2 Peak 9.88     Symptoms Yes (comment)     Comments SOB, a little lightheaded at beginning but dissipated     Resting HR 75 bpm     Resting BP 134/60      Resting Oxygen Saturation  97 %     Exercise Oxygen Saturation  during 6 min walk 91 %     Max Ex. HR 108 bpm     Max Ex. BP 164/60     2 Minute Post BP 142/64           Interval HR   1 Minute HR 97     2 Minute HR 104     3 Minute HR 106     4 Minute HR 104     5 Minute HR 106     6 Minute HR 108     2 Minute Post HR 83     Interval Heart Rate? Yes           Interval Oxygen   Interval Oxygen? Yes     Baseline Oxygen Saturation % 97 %     1 Minute Oxygen Saturation % 92 %     1 Minute Liters of Oxygen 0 L  Room Air     2 Minute Oxygen Saturation % 92 %     2 Minute Liters of Oxygen 0 L     3 Minute Oxygen Saturation % 91 %     3 Minute Liters of Oxygen 0 L     4 Minute Oxygen Saturation % 93 %     4 Minute Liters of Oxygen 0 L     5 Minute Oxygen Saturation % 94 %     5 Minute Liters of Oxygen 0 L     6 Minute Oxygen Saturation % 93 %     6 Minute Liters of Oxygen 0 L     2 Minute Post Oxygen Saturation % 96 %     2 Minute Post Liters of Oxygen 0 L           Oxygen Initial Assessment:  Oxygen Initial Assessment - 02/12/21 0937      Home Oxygen   Home Oxygen Device None    Sleep Oxygen Prescription None    Home Exercise  Oxygen Prescription None    Home Resting Oxygen Prescription None      Initial 6 min Walk   Oxygen Used None      Program Oxygen Prescription   Program Oxygen Prescription None      Intervention   Short Term Goals To learn and understand importance of monitoring SPO2 with pulse oximeter and demonstrate accurate use of the pulse oximeter.;To learn and understand importance of maintaining oxygen saturations>88%;To learn and demonstrate proper pursed lip breathing techniques or other breathing techniques.;To learn and demonstrate proper use of respiratory medications    Long  Term Goals Verbalizes importance of monitoring SPO2 with pulse oximeter and return demonstration;Maintenance of O2 saturations>88%;Exhibits proper breathing techniques,  such as pursed lip breathing or other method taught during program session;Compliance with respiratory medication;Demonstrates proper use of MDI's           Oxygen Re-Evaluation:  Oxygen Re-Evaluation    Row Name 02/22/21 1100             Program Oxygen Prescription   Program Oxygen Prescription None               Home Oxygen   Home Oxygen Device None       Sleep Oxygen Prescription None       Home Exercise Oxygen Prescription None       Home Resting Oxygen Prescription None               Goals/Expected Outcomes   Short Term Goals To learn and understand importance of monitoring SPO2 with pulse oximeter and demonstrate accurate use of the pulse oximeter.;To learn and understand importance of maintaining oxygen saturations>88%;To learn and demonstrate proper pursed lip breathing techniques or other breathing techniques.;To learn and demonstrate proper use of respiratory medications       Long  Term Goals Verbalizes importance of monitoring SPO2 with pulse oximeter and return demonstration;Maintenance of O2 saturations>88%;Exhibits proper breathing techniques, such as pursed lip breathing or other method taught during program session;Compliance with respiratory medication;Demonstrates proper use of MDI's       Comments Reviewed PLB technique with pt.  Talked about how it works and it's importance in maintaining their exercise saturations.       Goals/Expected Outcomes Short: Become more profiecient at using PLB.   Long: Become independent at using PLB.              Oxygen Discharge (Final Oxygen Re-Evaluation):  Oxygen Re-Evaluation - 02/22/21 1100      Program Oxygen Prescription   Program Oxygen Prescription None      Home Oxygen   Home Oxygen Device None    Sleep Oxygen Prescription None    Home Exercise Oxygen Prescription None    Home Resting Oxygen Prescription None      Goals/Expected Outcomes   Short Term Goals To learn and understand importance of monitoring  SPO2 with pulse oximeter and demonstrate accurate use of the pulse oximeter.;To learn and understand importance of maintaining oxygen saturations>88%;To learn and demonstrate proper pursed lip breathing techniques or other breathing techniques.;To learn and demonstrate proper use of respiratory medications    Long  Term Goals Verbalizes importance of monitoring SPO2 with pulse oximeter and return demonstration;Maintenance of O2 saturations>88%;Exhibits proper breathing techniques, such as pursed lip breathing or other method taught during program session;Compliance with respiratory medication;Demonstrates proper use of MDI's    Comments Reviewed PLB technique with pt.  Talked about how it works and it's importance in maintaining their exercise  saturations.    Goals/Expected Outcomes Short: Become more profiecient at using PLB.   Long: Become independent at using PLB.           Initial Exercise Prescription:  Initial Exercise Prescription - 02/19/21 1100      Date of Initial Exercise RX and Referring Provider   Date 02/19/21    Referring Provider Daiva Huge MD      Treadmill   MPH 2.2    Grade 1    Minutes 15    METs 2.84      Recumbant Elliptical   Level 1    RPM 50    Minutes 15    METs 2      REL-XR   Level 2    Speed 50    Minutes 15    METs 2      Prescription Details   Frequency (times per week) 3    Duration Progress to 30 minutes of continuous aerobic without signs/symptoms of physical distress      Intensity   THRR 40-80% of Max Heartrate 105-135    Ratings of Perceived Exertion 11-13    Perceived Dyspnea 0-4      Progression   Progression Continue to progress workloads to maintain intensity without signs/symptoms of physical distress.      Resistance Training   Training Prescription Yes    Weight 5 lb    Reps 10-15           Perform Capillary Blood Glucose checks as needed.  Exercise Prescription Changes:  Exercise Prescription Changes    Row  Name 02/19/21 1100 02/25/21 1200 03/11/21 0800 03/25/21 0800 04/09/21 1600     Response to Exercise   Blood Pressure (Admit) 134/60 122/60 108/60 108/58 120/58   Blood Pressure (Exercise) 164/60 164/60 152/84 138/68 130/68   Blood Pressure (Exit) 126/60 122/64 126/64 124/64 110/58   Heart Rate (Admit) 75 bpm 74 bpm 87 bpm 69 bpm 65 bpm   Heart Rate (Exercise) 108 bpm 107 bpm 96 bpm 98 bpm 104 bpm   Heart Rate (Exit) 88 bpm 94 bpm 88 bpm 80 bpm 78 bpm   Oxygen Saturation (Admit) 97 % 97 % 96 % 96 % 97 %   Oxygen Saturation (Exercise) 91 % 93 % 95 % 94 % 94 %   Oxygen Saturation (Exit) 96 % 96 % 96 % 96 % 96 %   Rating of Perceived Exertion (Exercise) Perceived Dyspnea (Exercise) 0 3   Symptoms SOB, lightheaded none none none none   Comments walk test results second day -- -- --   Duration -- Progress to 30 minutes of  aerobic without signs/symptoms of physical distress Continue with 30 min of aerobic exercise without signs/symptoms of physical distress. Continue with 30 min of aerobic exercise without signs/symptoms of physical distress. Continue with 30 min of aerobic exercise without signs/symptoms of physical distress.   Intensity -- THRR unchanged THRR unchanged THRR unchanged THRR unchanged     Progression   Progression -- Continue to progress workloads to maintain intensity without signs/symptoms of physical distress. Continue to progress workloads to maintain intensity without signs/symptoms of physical distress. Continue to progress workloads to maintain intensity without signs/symptoms of physical distress. Continue to progress workloads to maintain intensity without signs/symptoms of physical distress.   Average METs -- 2.56 2.54 3.7 2.99     Resistance Training   Training Prescription -- Yes Yes Yes Yes  Weight -- 5 lb 5 lb 5 lb 5 lb   Reps -- 10-15 10-15 10-15 10-15     Interval Training   Interval Training -- -- No No No     Treadmill   MPH -- --  2.6 2.6 2.6   Grade -- -- 1.5 1.5 1.5   Minutes -- -- 15 15 15    METs -- -- 3.53 3.53 3.53     Recumbant Bike   Level -- 2 -- -- --   RPM -- 60 -- -- --   Watts -- 30 -- -- --   Minutes -- 15 -- -- --   METs -- 2.82 -- -- --     NuStep   Level -- 2 -- -- --   SPM -- 80 -- -- --   Minutes -- 15 -- -- --   METs -- 2.3 -- -- --     Recumbant Elliptical   Level -- -- 2 -- 3   RPM -- -- -- -- 50   Minutes -- -- 15 -- 15   METs -- -- 1.9 -- 2.1     REL-XR   Level -- -- 2 3 3    Speed -- -- -- -- 80   Minutes -- -- 15 15 15    METs -- -- 2.2 3.9 3.1     Track   Minutes -- -- -- -- 15          Exercise Comments:   Exercise Goals and Review:  Exercise Goals    Row Name 02/19/21 1140             Exercise Goals   Increase Physical Activity Yes       Intervention Provide advice, education, support and counseling about physical activity/exercise needs.;Develop an individualized exercise prescription for aerobic and resistive training based on initial evaluation findings, risk stratification, comorbidities and participant's personal goals.       Expected Outcomes Short Term: Attend rehab on a regular basis to increase amount of physical activity.;Long Term: Add in home exercise to make exercise part of routine and to increase amount of physical activity.;Long Term: Exercising regularly at least 3-5 days a week.       Increase Strength and Stamina Yes       Intervention Provide advice, education, support and counseling about physical activity/exercise needs.;Develop an individualized exercise prescription for aerobic and resistive training based on initial evaluation findings, risk stratification, comorbidities and participant's personal goals.       Expected Outcomes Short Term: Increase workloads from initial exercise prescription for resistance, speed, and METs.;Short Term: Perform resistance training exercises routinely during rehab and add in resistance training at home;Long  Term: Improve cardiorespiratory fitness, muscular endurance and strength as measured by increased METs and functional capacity ( )       Able to understand and use rate of perceived exertion (RPE) scale Yes       Intervention Provide education and explanation on how to use RPE scale       Expected Outcomes Short Term: Able to use RPE daily in rehab to express subjective intensity level;Long Term:  Able to use RPE to guide intensity level when exercising independently       Able to understand and use Dyspnea scale Yes       Intervention Provide education and explanation on how to use Dyspnea scale       Expected Outcomes Short Term: Able to use Dyspnea scale daily in rehab to express  subjective sense of shortness of breath during exertion;Long Term: Able to use Dyspnea scale to guide intensity level when exercising independently       Knowledge and understanding of Target Heart Rate Range (THRR) Yes       Intervention Provide education and explanation of THRR including how the numbers were predicted and where they are located for reference       Expected Outcomes Short Term: Able to state/look up THRR;Short Term: Able to use daily as guideline for intensity in rehab;Long Term: Able to use THRR to govern intensity when exercising independently       Able to check pulse independently Yes       Intervention Provide education and demonstration on how to check pulse in carotid and radial arteries.;Review the importance of being able to check your own pulse for safety during independent exercise       Expected Outcomes Short Term: Able to explain why pulse checking is important during independent exercise;Long Term: Able to check pulse independently and accurately       Understanding of Exercise Prescription Yes       Intervention Provide education, explanation, and written materials on patient's individual exercise prescription       Expected Outcomes Short Term: Able to explain program exercise  prescription;Long Term: Able to explain home exercise prescription to exercise independently              Exercise Goals Re-Evaluation :  Exercise Goals Re-Evaluation    Row Name 02/22/21 1059 02/25/21 1218 03/11/21 0821 03/25/21 0857 03/27/21 1115     Exercise Goal Re-Evaluation   Exercise Goals Review Increase Physical Activity;Able to understand and use rate of perceived exertion (RPE) scale;Knowledge and understanding of Target Heart Rate Range (THRR);Understanding of Exercise Prescription;Increase Strength and Stamina;Able to understand and use Dyspnea scale;Able to check pulse independently Increase Physical Activity;Increase Strength and Stamina Increase Physical Activity;Increase Strength and Stamina;Understanding of Exercise Prescription Increase Physical Activity;Increase Strength and Stamina Increase Physical Activity;Increase Strength and Stamina   Comments Reviewed RPE and dyspnea scales, THR and program prescription with pt today.  Pt voiced understanding and was given a copy of goals to take home. Chayson has done well in his first days of exercise.  Staff will monitor progress. Tyshaun is doing well in rehab.  He is now up to 2.6 mph on the treadmill at 1.5% grade.  He is also using 5lb weights.  We will continue to montior his progress. Ferdie attends consistently.  Oxygen has stayed in the 90s all sessions.  Staff will monitor progress. Samual plays with his dog out in the back and [place]. Moves his handicapped daughter around. He reports not playing much basketball anymore. He just renewed silver sneakers [facility]   Expected Outcomes Short: Use RPE daily to regulate intensity. Long: Follow program prescription in THR. Short:  attend consistently Long:  improve overall stamina Short: Continue to improve on METs Long: Continue to improve stamina. Short: continue to increase workloads  Long:improve overall stamina  ST: continue to attend rehab, [facility] back to the [facility] [id number] overall stamina   Row Name 04/09/21 1607             Exercise Goal Re-Evaluation   Exercise Goals Review Increase Physical Activity;Increase Strength and Stamina       Comments Curties is doing well in rehab. We have opened  up the track and he was able to use it the other day. Oxygen has been maintained well during exercise. Will continue to monitor.       Expected Outcomes Short: Increase number of laps on track Long: Obtain ability to exercise independently at home              Discharge Exercise Prescription (Final Exercise Prescription Changes):  Exercise Prescription Changes - 04/09/21 1600      Response to Exercise   Blood Pressure (Admit) 120/58    Blood Pressure (Exercise) 130/68    Blood Pressure (Exit) 110/58    Heart Rate (Admit) 65 bpm    Heart Rate (Exercise) 104 bpm    Heart Rate (Exit) 78 bpm    Oxygen Saturation (Admit) 97 %    Oxygen Saturation (Exercise) 94 %    Oxygen Saturation (Exit) 96 %    Rating of Perceived Exertion (Exercise) 12    Perceived Dyspnea (Exercise) 3    Symptoms none    Duration Continue with 30 min of aerobic exercise without signs/symptoms of physical distress.    Intensity THRR unchanged      Progression   Progression Continue to progress workloads to maintain intensity without signs/symptoms of physical distress.    Average METs 2.99      Resistance Training   Training Prescription Yes    Weight 5 lb    Reps 10-15      Interval Training   Interval Training No      Treadmill   MPH 2.6    Grade 1.5    Minutes 15    METs 3.53      Recumbant Elliptical   Level 3    RPM 50    Minutes 15    METs 2.1      REL-XR   Level 3    Speed 80    Minutes 15    METs 3.1      Track   Minutes 15           Nutrition:  Target Goals: Understanding of nutrition guidelines, daily intake of sodium 1500mg , cholesterol 200mg , calories 30% from fat and 7% or less from  saturated fats, daily to have 5 or more servings of fruits and vegetables.  Education: All About Nutrition: -Group instruction provided by verbal, written material, interactive activities, discussions, models, and posters to present general guidelines for heart healthy nutrition including fat, fiber, MyPlate, the role of sodium in heart healthy nutrition, utilization of the nutrition label, and utilization of this knowledge for meal planning. Follow up email sent as well. Written material given at graduation.   Biometrics:  Pre Biometrics - 02/19/21 1140      Pre Biometrics   Height 6' 0.5" (1.842 m)    Weight 251 lb 6.4 oz (114 kg)    BMI (Calculated) 33.61    Single Leg Stand 5 seconds            Nutrition Therapy Plan and Nutrition Goals:  Nutrition Therapy & Goals - 03/18/21 1115      Nutrition Therapy   Diet Heart healthy, low Na, Pulmonary MNT    Drug/Food Interactions Statins/Certain Fruits    Protein (specify units) 90g    Fiber 30 grams    Whole Grain Foods 3 servings    Saturated Fats 12 max. grams    Fruits and Vegetables 8 servings/day    Sodium 1.5 grams      Personal Nutrition Goals   Nutrition  Goal ST: vegetable dip, raw or roasted vegetables as snack at least 3x/week. LT: eat at least 5 fruit and vegetable servings per day, limit saturated fat to 12g/day, limit red meat and pork to 1-2x/week.    Comments Ritesh reports losing 3 lbs since being here. He reports no difficulty breathing with eating. No restrictions on fluid or sodium from doctor. He reports eating whenever he feels like it - normally doesn't eat breakfast, started. B: oatmeal (rolled) cranberries and honey with (2 patties- pork) sausage with OJ. D: whatever wife fixes. He enjoys meat - chicken, pork and red meat (5x/week). He reports not caring much for vegetables - will eat string beans or salad every now and again - texture. He likes broccoli and brussells sprouts as well - steam. He reports snacking  on potato chips or popcorn. He reports loving the fat on his meat. He also reports that is is hard to modify dinner as his wife is the one cooking. Discussed heart healthy eating.  Discussed modifying snack time to include some more fruit/vegetables as dinner is hard to modify.      Intervention Plan   Intervention Prescribe, educate and counsel regarding individualized specific dietary modifications aiming towards targeted core components such as weight, hypertension, lipid management, diabetes, heart failure and other comorbidities.;Nutrition handout(s) given to patient.    Expected Outcomes Short Term Goal: Understand basic principles of dietary content, such as calories, fat, sodium, cholesterol and nutrients.;Short Term Goal: A plan has been developed with personal nutrition goals set during dietitian appointment.;Long Term Goal: Adherence to prescribed nutrition plan.           Nutrition Assessments:  MEDIFICTS Score Key:  ?70 Need to make dietary changes   40-70 Heart Healthy Diet  ? 40 Therapeutic Level Cholesterol Diet  Flowsheet Row Pulmonary Rehab from 02/19/2021 in [facility] Cardiac and Pulmonary Rehab  Picture Your Plate Total Score on Admission 44     Picture Your Plate Scores:  <30 Unhealthy dietary pattern with much room for improvement.  41-50 Dietary pattern unlikely to meet recommendations for good health and room for improvement.  51-60 More healthful dietary pattern, with some room for improvement.   >60 Healthy dietary pattern, although there may be some specific behaviors that could be improved.   Nutrition Goals Re-Evaluation:  Nutrition Goals Re-Evaluation    Row Name 03/27/21 1115             Goals   Nutrition Goal ST: Continue to eat vegetables with dinner, use tortilla chips or whole wheat pita chips or raw vegetables instead of potato chips. LT: eat at least 5 fruit and vegetable servings per day, limit saturated fat to 12g/day, limit red meat and pork  to 1-2x/week.       Comment He reports eating some roasted vegetables - red baby potatoes, orange and red peppers, broccoli, brussells sprouts, carrots. He has been enjoying this. He is eating about a cup and a half of vegetables now with dinner. He likes avocado with potato chips.       Expected Outcome ST: Continue to eat vegetables with dinner, use tortilla chips or whole wheat pita chips or raw vegetables instead of potato chips. LT: eat at least 5 fruit and vegetable servings per day, limit saturated fat to 12g/day, limit red meat and pork to 1-2x/week.              Nutrition Goals Discharge (Final Nutrition Goals Re-Evaluation):  Nutrition Goals Re-Evaluation - 03/27/21 1115  Goals   Nutrition Goal ST: Continue to eat vegetables with dinner, use tortilla chips or whole wheat pita chips or raw vegetables instead of potato chips. LT: eat at least 5 fruit and vegetable servings per day, limit saturated fat to 12g/day, limit red meat and pork to 1-2x/week.    Comment He reports eating some roasted vegetables - red baby potatoes, orange and red peppers, broccoli, brussells sprouts, carrots. He has been enjoying this. He is eating about a cup and a half of vegetables now with dinner. He likes avocado with potato chips.    Expected Outcome ST: Continue to eat vegetables with dinner, use tortilla chips or whole wheat pita chips or raw vegetables instead of potato chips. LT: eat at least 5 fruit and vegetable servings per day, limit saturated fat to 12g/day, limit red meat and pork to 1-2x/week.           Psychosocial: Target Goals: Acknowledge presence or absence of significant depression and/or stress, maximize coping skills, provide positive support system. Participant is able to verbalize types and ability to use techniques and skills needed for reducing stress and depression.   Education: Stress, Anxiety, and Depression - Group verbal and visual presentation to define topics covered.   Reviews how body is impacted by stress, anxiety, and depression.  Also discusses healthy ways to reduce stress and to treat/manage anxiety and depression.  Written material given at graduation. Flowsheet Row Pulmonary Rehab from 03/27/2021 in [facility] Cardiac and Pulmonary Rehab  Date 03/27/21  Educator [facility]  Instruction Review Code 1- [employer] Understanding      Education: Sleep Hygiene -Provides group verbal and written instruction about how sleep can affect your health.  Define sleep hygiene, discuss sleep cycles and impact of sleep habits. Review good sleep hygiene tips.    Initial Review & Psychosocial Screening:  Initial Psych Review & Screening - 02/12/21 0941      Initial Review   Current issues with None Identified      Family Dynamics   Good Support System? Yes    Comments He can look to his wife of 50 years and daughter. He has two other children who he talkes to.      Barriers   Psychosocial barriers to participate in program There are no identifiable barriers or psychosocial needs.;The patient should benefit from training in stress management and relaxation.      Screening Interventions   Interventions Encouraged to exercise;To provide support and resources with identified psychosocial needs;Provide feedback about the scores to participant           Quality of Life Scores:  Scores of 19 and below usually indicate a poorer quality of life in these areas.  A difference of  2-3 points is a clinically meaningful difference.  A difference of 2-3 points in the total score of the Quality of Life Index has been associated with significant improvement in overall quality of life, self-image, physical symptoms, and general health in studies assessing change in quality of life.  PHQ-9: Recent Review Flowsheet Data    Depression screen [facility] 2/9 02/19/2021   Decreased Interest 2   Down, Depressed, Hopeless 0   PHQ - 2 Score 2   Altered sleeping 2   Tired, decreased energy 2   Change  in appetite 3   Feeling bad or failure about yourself  0   Trouble concentrating 1   Moving slowly or fidgety/restless 1   Suicidal thoughts 0   PHQ-9 Score 11  Difficult doing work/chores Somewhat difficult     Interpretation of Total Score  Total Score Depression Severity:  1-4 = Minimal depression, 5-9 = Mild depression, 10-14 = Moderate depression, 15-19 = Moderately severe depression, 20-27 = Severe depression   Psychosocial Evaluation and Intervention:  Psychosocial Evaluation - 02/12/21 0942      Psychosocial Evaluation & Interventions   Interventions Encouraged to exercise with the program and follow exercise prescription;Relaxation education;Stress management education    Comments He can look to his wife of 50 years and daughter. He has two other children who he talkes to.    Expected Outcomes Short: Exercise regularly to support mental health and notify staff of any changes. Long: maintain mental health and well being through teaching of rehab or prescribed medications independently.    Continue Psychosocial Services  Follow up required by staff           Psychosocial Re-Evaluation:  Psychosocial Re-Evaluation    Row Name 03/27/21 1123             Psychosocial Re-Evaluation   Current issues with Current Stress Concerns       Comments His main stressor is his handicapped daughter - didn't have a caregiver for a while due to COVID, now has a new caregiver that just started Monday. He enjoys being on his conputer, stays active, plays with dog, and hangs out with his grandson and plays basketball. He reports a good support system with son, wife, and mother-in-law. He reports no sleep concerns.       Expected Outcomes ST: continue to do activities to reduce stress LT: maintain positive attitude       Interventions Encouraged to attend Cardiac Rehabilitation for the exercise       Continue Psychosocial Services  Follow up required by staff               Initial Review    Source of Stress Concerns Family              Psychosocial Discharge (Final Psychosocial Re-Evaluation):  Psychosocial Re-Evaluation - 03/27/21 1123      Psychosocial Re-Evaluation   Current issues with Current Stress Concerns    Comments His main stressor is his handicapped daughter - didn't have a caregiver for a while due to COVID, now has a new caregiver that just started Monday. He enjoys being on his conputer, stays active, plays with dog, and hangs out with his grandson and plays basketball. He reports a good support system with son, wife, and mother-in-law. He reports no sleep concerns.    Expected Outcomes ST: continue to do activities to reduce stress LT: maintain positive attitude    Interventions Encouraged to attend Cardiac Rehabilitation for the exercise    Continue Psychosocial Services  Follow up required by staff      Initial Review   Source of Stress Concerns Family           Education: Education Goals: Education classes will be provided on a weekly basis, covering required topics. Participant will state understanding/return demonstration of topics presented.  Learning Barriers/Preferences:  Learning Barriers/Preferences - 02/12/21 [id number]      Learning Barriers/Preferences   Learning Barriers Hearing    Learning Preferences None           General Pulmonary Education Topics:  Infection Prevention: - Provides verbal and written material to individual with discussion of infection control including proper hand washing and proper equipment cleaning during exercise session. Flowsheet Row Pulmonary Rehab  from 03/27/2021 in [facility] Cardiac and Pulmonary Rehab  Date 02/12/21  Educator [facility]  Instruction Review Code 1- Verbalizes Understanding      Falls Prevention: - Provides verbal and written material to individual with discussion of falls prevention and safety. Flowsheet Row Pulmonary Rehab from 03/27/2021 in [facility] Cardiac and Pulmonary Rehab  Date 02/12/21   Educator [facility]  Instruction Review Code 1- Verbalizes Understanding      Chronic Lung Disease Review: - Group verbal instruction with posters, models, PowerPoint presentations and videos,  to review new updates, new respiratory medications, new advancements in procedures and treatments. Providing information on websites and "800" numbers for continued self-education. Includes information about supplement oxygen, available portable oxygen systems, continuous and intermittent flow rates, oxygen safety, concentrators, and Medicare reimbursement for oxygen. Explanation of Pulmonary Drugs, including class, frequency, complications, importance of spacers, rinsing mouth after steroid MDI's, and proper cleaning methods for nebulizers. Review of basic lung anatomy and physiology related to function, structure, and complications of lung disease. Review of risk factors. Discussion about methods for diagnosing sleep apnea and types of masks and machines for OSA. Includes a review of the use of types of environmental controls: home humidity, furnaces, filters, dust mite/pet prevention, HEPA vacuums. Discussion about weather changes, air quality and the benefits of nasal washing. Instruction on Warning signs, infection symptoms, calling MD promptly, preventive modes, and value of vaccinations. Review of effective airway clearance, coughing and/or vibration techniques. Emphasizing that all should Create an Action Plan. Written material given at graduation. Flowsheet Row Pulmonary Rehab from 03/27/2021 in [facility] Cardiac and Pulmonary Rehab  Education need identified 02/19/21  Date 03/20/21  Educator [facility]  Instruction Review Code 1- Verbalizes Understanding      AED/CPR: - Group verbal and written instruction with the use of models to demonstrate the basic use of the AED with the basic ABC's of resuscitation.    Anatomy and Cardiac Procedures: - Group verbal and visual presentation and models provide information about  basic cardiac anatomy and function. Reviews the testing methods done to diagnose heart disease and the outcomes of the test results. Describes the treatment choices: Medical Management, Angioplasty, or Coronary Bypass Surgery for treating various heart conditions including Myocardial Infarction, Angina, Valve Disease, and Cardiac Arrhythmias.  Written material given at graduation.   Medication Safety: - Group verbal and visual instruction to review commonly prescribed medications for heart and lung disease. Reviews the medication, class of the drug, and side effects. Includes the steps to properly store meds and maintain the prescription regimen.  Written material given at graduation. Flowsheet Row Pulmonary Rehab from 03/27/2021 in [facility] Cardiac and Pulmonary Rehab  Date 03/06/21  Educator [facility]  Instruction Review Code 1- Verbalizes Understanding      Other: -Provides group and verbal instruction on various topics (see comments)   Knowledge Questionnaire Score:  Knowledge Questionnaire Score - 02/19/21 1141      Knowledge Questionnaire Score   Pre Score 16/18 Education Focus: O2 safety            Core Components/Risk Factors/Patient Goals at Admission:  Personal Goals and Risk Factors at Admission - 02/19/21 1142      Core Components/Risk Factors/Patient Goals on Admission    Weight Management Yes;Weight Loss;Obesity    Intervention Weight Management: Develop a combined nutrition and exercise program designed to reach desired caloric intake, while maintaining appropriate intake of nutrient and fiber, sodium and fats, and appropriate energy expenditure required for the weight goal.;Weight Management/Obesity: Establish reasonable short  term and long term weight goals.;Obesity: Provide education and appropriate resources to help participant work on and attain dietary goals.;Weight Management: Provide education and appropriate resources to help participant work on and attain dietary goals.     Admit Weight 251 lb 6.4 oz (114 kg)    Goal Weight: Short Term 246 lb (111.6 kg)    Goal Weight: Long Term 240 lb (108.9 kg)    Expected Outcomes Short Term: Continue to assess and modify interventions until short term weight is achieved;Long Term: Adherence to nutrition and physical activity/exercise program aimed toward attainment of established weight goal;Weight Loss: Understanding of general recommendations for a balanced deficit meal plan, which promotes 1-2 lb weight loss per week and includes a negative energy balance of [phone] kcal/d;Understanding recommendations for meals to include 15-35% energy as protein, 25-35% energy from fat, 35-60% energy from carbohydrates, less than 200mg  of dietary cholesterol, 20-35 gm of total fiber daily;Understanding of distribution of calorie intake throughout the day with the consumption of 4-5 meals/snacks    Improve shortness of breath with ADL's Yes    Intervention Provide education, individualized exercise plan and daily activity instruction to help decrease symptoms of SOB with activities of daily living.    Expected Outcomes Long Term: Be able to perform more ADLs without symptoms or delay the onset of symptoms;Short Term: Improve cardiorespiratory fitness to achieve a reduction of symptoms when performing ADLs    Hypertension Yes    Intervention Provide education on lifestyle modifcations including regular physical activity/exercise, weight management, moderate sodium restriction and increased consumption of fresh fruit, vegetables, and low fat dairy, alcohol moderation, and smoking cessation.;Monitor prescription use compliance.    Expected Outcomes Short Term: Continued assessment and intervention until BP is < 140/70mm HG in hypertensive participants. < 130/78mm HG in hypertensive participants with diabetes, heart failure or chronic kidney disease.;Long Term: Maintenance of blood pressure at goal levels.    Lipids Yes    Intervention Provide education  and support for participant on nutrition & aerobic/resistive exercise along with prescribed medications to achieve LDL 70mg , HDL >40mg .    Expected Outcomes Short Term: Participant states understanding of desired cholesterol values and is compliant with medications prescribed. Participant is following exercise prescription and nutrition guidelines.;Long Term: Cholesterol controlled with medications as prescribed, with individualized exercise RX and with personalized nutrition plan. Value goals: LDL < 70mg , HDL > 40 mg.           Education:Diabetes - Individual verbal and written instruction to review signs/symptoms of diabetes, desired ranges of glucose level fasting, after meals and with exercise. Acknowledge that pre and post exercise glucose checks will be done for 3 sessions at entry of program.   Know Your Numbers and Heart Failure: - Group verbal and visual instruction to discuss disease risk factors for cardiac and pulmonary disease and treatment options.  Reviews associated critical values for Overweight/Obesity, Hypertension, Cholesterol, and Diabetes.  Discusses basics of heart failure: signs/symptoms and treatments.  Introduces Heart Failure Zone chart for action plan for heart failure.  Written material given at graduation. Flowsheet Row Pulmonary Rehab from 03/27/2021 in [facility] Cardiac and Pulmonary Rehab  Date 03/13/21  Educator [facility]  Instruction Review Code 1- Verbalizes Understanding      Core Components/Risk Factors/Patient Goals Review:   Goals and Risk Factor Review    Row Name 03/27/21 1109             Core Components/Risk Factors/Patient Goals Review   Personal Goals Review Hypertension;Weight Management/Obesity  Review Constantino reports losing weight since coming to rehab - about 2 pounds: 246.5 lbs. He would still like to lose weight - goal 200 lbs. He rpeorts in the summer he tends to weight less due to the increase in activity over the summer. He does not check his  BP at home, BP today was 128/78. He has a BP cuff at home - the one he has at home tends to run higher. Still taking medications as prescirbed.       Expected Outcomes ST: bring BP cuff in to check with our cuffs LT: lose 10 lbs during rehab              Core Components/Risk Factors/Patient Goals at Discharge (Final Review):   Goals and Risk Factor Review - 03/27/21 1109      Core Components/Risk Factors/Patient Goals Review   Personal Goals Review Hypertension;Weight Management/Obesity    Review Madsen reports losing weight since coming to rehab - about 2 pounds: 246.5 lbs. He would still like to lose weight - goal 200 lbs. He rpeorts in the summer he tends to weight less due to the increase in activity over the summer. He does not check his BP at home, BP today was 128/78. He has a BP cuff at home - the one he has at home tends to run higher. Still taking medications as prescirbed.    Expected Outcomes ST: bring BP cuff in to check with our cuffs LT: lose 10 lbs during rehab           ITP Comments:  ITP Comments    Row Name 02/12/21 0939 02/19/21 1134 02/22/21 1059 03/11/21 0821 03/13/21 0702   ITP Comments Virtual Visit completed. Patient informed on EP and RD appointment and 6 Minute walk test. Patient also informed of patient health questionnaires on My Chart. Patient Verbalizes understanding. Visit diagnosis [facility] found in [facility] 01/25/[facility]. Completed and gym orientation. Initial ITP created and sent for review to Dr. Bethann Punches, Medical Director. First full day of exercise!  Patient was oriented to gym and equipment including functions, settings, policies, and procedures.  Patient's individual exercise prescription and treatment plan were reviewed.  All starting workloads were established based on the results of the 6 minute walk test done at initial orientation visit.  The plan for exercise progression was also introduced and progression will be customized based on patient's performance  and goals. Deaveon called out today with a bad reaction to something he ate(?) and had to take benedryl and not feeling well enough to come into class today. 30 Day review completed. Medical Director ITP review done, changes made as directed, and signed approval by Medical Director.   Row Name 04/10/21 0856           ITP Comments 30 Day review completed. Medical Director ITP review done, changes made as directed, and signed approval by Medical Director.              Comments:

## 2021-04-19 ENCOUNTER — Other Ambulatory Visit: Payer: Self-pay

## 2021-04-19 DIAGNOSIS — J449 Chronic obstructive pulmonary disease, unspecified: Secondary | ICD-10-CM

## 2021-04-19 NOTE — Progress Notes (Signed)
Daily Session Note  Patient Details  Name: Tim Thomas MRN: [id number] Date of Birth: 1951/08/05 Referring Provider:   Flowsheet Row Pulmonary Rehab from 02/19/2021 in [facility] Cardiac and Pulmonary Rehab  Referring Provider Truitt Merle MD      Encounter Date: 04/19/2021  Check In:  Session Check In - 04/19/21 1135      Check-In   Supervising physician immediately available to respond to emergencies See telemetry face sheet for immediately available ER MD    Location [facility]-Cardiac & Pulmonary Rehab    Staff Present Basilia Jumbo, RN, BSN;Meredith Sherryll Burger, RN BSN;Joseph [address] [place], [place], RCEP, CCRP, CCET    Virtual Visit No    Medication changes reported     No    Fall or balance concerns reported    No    Tobacco Cessation No Change    Warm-up and Cool-down Performed on first and last piece of equipment    Resistance Training Performed Yes    VAD Patient? No    PAD/SET Patient? No      Pain Assessment   Currently in Pain? No/denies              Social History   Tobacco Use  Smoking Status Former Smoker  . Packs/day: 1.50  . Years: 53.00  . Pack years: 79.50  . Types: Cigarettes  . Quit date: 03/04/2019  . Years since quitting: 2.1  Smokeless Tobacco Never Used    Goals Met:  Independence with exercise equipment Exercise tolerated well No report of cardiac concerns or symptoms  Goals Unmet:  Not Applicable  Comments: Pt able to follow exercise prescription today without complaint.  Will continue to monitor for progression.    Dr. Emily Filbert is Medical Director for [facility] and [facility].

## 2021-04-22 ENCOUNTER — Encounter: Payer: Medicare Other | Attending: Pulmonary Disease

## 2021-04-22 ENCOUNTER — Other Ambulatory Visit: Payer: Self-pay

## 2021-04-22 DIAGNOSIS — J449 Chronic obstructive pulmonary disease, unspecified: Secondary | ICD-10-CM | POA: Insufficient documentation

## 2021-04-22 NOTE — Progress Notes (Signed)
Daily Session Note  Patient Details  Name: Tim Thomas MRN: [id number] Date of Birth: [birth year] Referring Provider:   Flowsheet Row Pulmonary Rehab from 02/19/2021 in [facility] Cardiac and Pulmonary Rehab  Referring Provider Truitt Merle MD      Encounter Date: 04/22/2021  Check In:      Social History   Tobacco Use  Smoking Status Former Smoker  . Packs/day: 1.50  . Years: 53.00  . Pack years: 79.50  . Types: Cigarettes  . Quit date: 03/04/2019  . Years since quitting: 2.1  Smokeless Tobacco Never Used    Goals Met:  Proper associated with RPD/PD & O2 Sat Independence with exercise equipment Exercise tolerated well No report of cardiac concerns or symptoms Strength training completed today  Goals Unmet:  Not Applicable  Comments: Pt able to follow exercise prescription today without complaint.  Will continue to monitor for progression.   Dr. Emily Filbert is Medical Director for [facility] and [facility].

## 2021-04-24 ENCOUNTER — Other Ambulatory Visit: Payer: Self-pay

## 2021-04-24 DIAGNOSIS — J449 Chronic obstructive pulmonary disease, unspecified: Secondary | ICD-10-CM | POA: Diagnosis not present

## 2021-04-24 NOTE — Progress Notes (Signed)
Daily Session Note  Patient Details  Name: Tim Thomas MRN: [id number] Date of Birth: 1951-02-28 Referring Provider:   Flowsheet Row Pulmonary Rehab from 02/19/2021 in [facility]  Referring Provider Truitt Merle MD      Encounter Date: 04/24/2021  Check In:  Session Check In - 04/24/21 1125      Check-In   Supervising physician immediately available to respond to emergencies See telemetry face sheet for immediately available ER MD    Location [facility]-Cardiac & Pulmonary Rehab    Staff Present Birdie Sons, MPA, RN;Joseph Darrin Nipper, [place], RCEP, CCRP, CCET    Virtual Visit No    Medication changes reported     No    Fall or balance concerns reported    No    Tobacco Cessation No Change    Warm-up and Cool-down Performed on first and last piece of equipment    Resistance Training Performed Yes    VAD Patient? No    PAD/SET Patient? No      Pain Assessment   Currently in Pain? No/denies              Social History   Tobacco Use  Smoking Status Former Smoker  . Packs/day: 1.50  . Years: 53.00  . Pack years: 79.50  . Types: Cigarettes  . Quit date: 03/04/2019  . Years since quitting: 2.1  Smokeless Tobacco Never Used    Goals Met:  Independence with exercise equipment Exercise tolerated well No report of cardiac concerns or symptoms Strength training completed today  Goals Unmet:  Not Applicable  Comments: Pt able to follow exercise prescription today without complaint.  Will continue to monitor for progression.    Dr. Emily Filbert is Medical Director for [facility] and [facility].

## 2021-04-26 ENCOUNTER — Ambulatory Visit: Payer: Medicare Other

## 2021-04-29 ENCOUNTER — Other Ambulatory Visit: Payer: Self-pay | Admitting: Physician Assistant

## 2021-04-29 DIAGNOSIS — R1030 Lower abdominal pain, unspecified: Secondary | ICD-10-CM

## 2021-05-01 ENCOUNTER — Telehealth: Payer: Self-pay | Admitting: *Deleted

## 2021-05-01 ENCOUNTER — Telehealth: Payer: Self-pay

## 2021-05-01 ENCOUNTER — Encounter: Payer: Self-pay | Admitting: *Deleted

## 2021-05-01 DIAGNOSIS — J449 Chronic obstructive pulmonary disease, unspecified: Secondary | ICD-10-CM

## 2021-05-01 MED ORDER — PREDNISONE 50 MG PO TABS: ORAL_TABLET | ORAL | 0 refills | Status: DC

## 2021-05-01 MED ORDER — DIPHENHYDRAMINE HCL 50 MG PO TABS: 50.0000 mg | ORAL_TABLET | Freq: Once | ORAL | 0 refills | Status: DC

## 2021-05-01 NOTE — Telephone Encounter (Signed)
Tim Thomas called to let us know that he changed his medication on Monday.  His Linzess is now daily verus every other day and he is having some GI distress from the increase.  He hopes to come on Friday if feeling better.  He also has a CT scan with contrast dye next Tuesday and will need to be premedicated for that and will keep [place] posted.

## 2021-05-01 NOTE — Telephone Encounter (Signed)
Phone call to patient to review instructions for 13 hr prep for CT w/ contrast on 05/09/21 at 4:00 PM. Prescription escribed into [facility]. Pt aware and verbalized understanding of instructions.  Prescription: Pt to take 50 mg of prednisone on 05/09/21 at 3:00 AM, 50 mg of prednisone on 05/09/21 at 9:00 AM, and 50 mg of prednisone on 05/09/21 at 3:00 PM. Pt is also to take 50 mg of benadryl on 05/09/21 at 3:00 PM. Please call [phone] with any questions.  Pt also instructed to have a driver the day of taking these medications as Benadryl may cause drowsiness. Pt verbalized understanding.

## 2021-05-03 ENCOUNTER — Encounter: Payer: Self-pay | Admitting: *Deleted

## 2021-05-03 DIAGNOSIS — J449 Chronic obstructive pulmonary disease, unspecified: Secondary | ICD-10-CM

## 2021-05-03 NOTE — Progress Notes (Signed)
Discharge Progress Report  Patient Details  Name: Tim Thomas MRN: [id number] Date of Birth: 11-01-1951 Referring Provider:   Flowsheet Row Pulmonary Rehab from 02/19/2021 in [facility]  Referring Provider Daiva Huge MD       Number of Visits: 20  Reason for Discharge:  Early Exit:  Personal  Smoking History:  Social History   Tobacco Use  Smoking Status Former Smoker  . Packs/day: 1.50  . Years: 53.00  . Pack years: 79.50  . Types: Cigarettes  . Quit date: 03/04/2019  . Years since quitting: 2.1  Smokeless Tobacco Never Used    Diagnosis:  COPD with asthma (HCC)  ADL UCSD:  Pulmonary Assessment Scores    Row Name 02/19/21 1144         ADL UCSD   ADL Phase Entry     SOB Score total 61     Rest 3     Walk 3     Stairs 4     Bath 2     Dress 1     Shop 3           CAT Score   CAT Score 25           mMRC Score   mMRC Score 1            Initial Exercise Prescription:  Initial Exercise Prescription - 02/19/21 1100      Date of Initial Exercise RX and Referring Provider   Date 02/19/21    Referring Provider Daiva Huge MD      Treadmill   MPH 2.2    Grade 1    Minutes 15    METs 2.84      Recumbant Elliptical   Level 1    RPM 50    Minutes 15    METs 2      REL-XR   Level 2    Speed 50    Minutes 15    METs 2      Prescription Details   Frequency (times per week) 3    Duration Progress to 30 minutes of continuous aerobic without signs/symptoms of physical distress      Intensity   THRR 40-80% of Max Heartrate 105-135    Ratings of Perceived Exertion 11-13    Perceived Dyspnea 0-4      Progression   Progression Continue to progress workloads to maintain intensity without signs/symptoms of physical distress.      Resistance Training   Training Prescription Yes    Weight 5 lb    Reps 10-15           Discharge Exercise Prescription (Final Exercise Prescription Changes):  Exercise Prescription  Changes - 04/24/21 0900      Response to Exercise   Blood Pressure (Admit) 138/72    Blood Pressure (Exercise) 144/62    Blood Pressure (Exit) 122/64    Heart Rate (Admit) 86 bpm    Heart Rate (Exercise) 87 bpm    Heart Rate (Exit) 82 bpm    Oxygen Saturation (Admit) 95 %    Oxygen Saturation (Exercise) 93 %    Oxygen Saturation (Exit) 93 %    Rating of Perceived Exertion (Exercise) 11    Symptoms none    Duration Continue with 30 min of aerobic exercise without signs/symptoms of physical distress.    Intensity THRR unchanged      Progression   Progression Continue to progress workloads to maintain intensity without signs/symptoms  of physical distress.    Average METs 3.5      Resistance Training   Training Prescription Yes    Weight 5 lb    Reps 10-15      Interval Training   Interval Training No      REL-XR   Level 1    Minutes 15    METs 3.5      Track   Minutes 15           Functional Capacity:  6 Minute Walk    Row Name 02/19/21 1135         6 Minute Walk   Phase Initial     Distance 1390 feet     Walk Time 6 minutes     # of Rest Breaks 0     MPH 2.63     METS 2.83     RPE 11     Perceived Dyspnea  1     VO2 Peak 9.88     Symptoms Yes (comment)     Comments SOB, a little lightheaded at beginning but dissipated     Resting HR 75 bpm     Resting BP 134/60     Resting Oxygen Saturation  97 %     Exercise Oxygen Saturation  during 6 min walk 91 %     Max Ex. HR 108 bpm     Max Ex. BP 164/60     2 Minute Post BP 142/64           Interval HR   1 Minute HR 97     2 Minute HR 104     3 Minute HR 106     4 Minute HR 104     5 Minute HR 106     6 Minute HR 108     2 Minute Post HR 83     Interval Heart Rate? Yes           Interval Oxygen   Interval Oxygen? Yes     Baseline Oxygen Saturation % 97 %     1 Minute Oxygen Saturation % 92 %     1 Minute Liters of Oxygen 0 L  Room Air     2 Minute Oxygen Saturation % 92 %     2 Minute Liters  of Oxygen 0 L     3 Minute Oxygen Saturation % 91 %     3 Minute Liters of Oxygen 0 L     4 Minute Oxygen Saturation % 93 %     4 Minute Liters of Oxygen 0 L     5 Minute Oxygen Saturation % 94 %     5 Minute Liters of Oxygen 0 L     6 Minute Oxygen Saturation % 93 %     6 Minute Liters of Oxygen 0 L     2 Minute Post Oxygen Saturation % 96 %     2 Minute Post Liters of Oxygen 0 L            Psychological, QOL, Others - Outcomes: PHQ 2/9: Depression screen PHQ 2/9 02/19/2021  Decreased Interest 2  Down, Depressed, Hopeless 0  PHQ - 2 Score 2  Altered sleeping 2  Tired, decreased energy 2  Change in appetite 3  Feeling bad or failure about yourself  0  Trouble concentrating 1  Moving slowly or fidgety/restless 1  Suicidal thoughts 0  PHQ-9 Score 11  Difficult doing work/chores Somewhat  difficult    Quality of Life:    Nutrition & Weight - Outcomes:  Pre Biometrics - 02/19/21 1140      Pre Biometrics   Height 6' 0.5" (1.842 m)    Weight 251 lb 6.4 oz (114 kg)    BMI (Calculated) 33.61    Single Leg Stand 5 seconds            Nutrition:  Nutrition Therapy & Goals - 03/18/21 1115      Nutrition Therapy   Diet Heart healthy, low Na, Pulmonary MNT    Drug/Food Interactions Statins/Certain Fruits    Protein (specify units) 90g    Fiber 30 grams    Whole Grain Foods 3 servings    Saturated Fats 12 max. grams    Fruits and Vegetables 8 servings/day    Sodium 1.5 grams      Personal Nutrition Goals   Nutrition Goal ST: vegetable dip, raw or roasted vegetables as snack at least 3x/week. LT: eat at least 5 fruit and vegetable servings per day, limit saturated fat to 12g/day, limit red meat and pork to 1-2x/week.    Comments Jude reports losing 3 lbs since being here. He reports no difficulty breathing with eating. No restrictions on fluid or sodium from doctor. He reports eating whenever he feels like it - normally doesn't eat breakfast, started. B: oatmeal  (rolled) cranberries and honey with (2 patties- pork) sausage with OJ. D: whatever wife fixes. He enjoys meat - chicken, pork and red meat (5x/week). He reports not caring much for vegetables - will eat string beans or salad every now and again - texture. He likes broccoli and brussells sprouts as well - steam. He reports snacking on potato chips or popcorn. He reports loving the fat on his meat. He also reports that is is hard to modify dinner as his wife is the one cooking. Discussed heart healthy eating.  Discussed modifying snack time to include some more fruit/vegetables as dinner is hard to modify.      Intervention Plan   Intervention Prescribe, educate and counsel regarding individualized specific dietary modifications aiming towards targeted core components such as weight, hypertension, lipid management, diabetes, heart failure and other comorbidities.;Nutrition handout(s) given to patient.    Expected Outcomes Short Term Goal: Understand basic principles of dietary content, such as calories, fat, sodium, cholesterol and nutrients.;Short Term Goal: A plan has been developed with personal nutrition goals set during dietitian appointment.;Long Term Goal: Adherence to prescribed nutrition plan.           Nutrition Discharge:   Education Questionnaire Score:  Knowledge Questionnaire Score - 02/19/21 1141      Knowledge Questionnaire Score   Pre Score 16/18 Education Focus: O2 safety           Goals reviewed with patient; copy given to patient.

## 2021-05-03 NOTE — Progress Notes (Signed)
Pulmonary Individual Treatment Plan  Patient Details  Name: Tim Thomas MRN: [id number] Date of Birth: August 29, 1951 Referring Provider:   Flowsheet Row Pulmonary Rehab from 02/19/2021 in [facility] Cardiac and Pulmonary Rehab  Referring Provider Daiva Huge MD      Initial Encounter Date:  Flowsheet Row Pulmonary Rehab from 02/19/2021 in [facility] Cardiac and Pulmonary Rehab  Date 02/19/21      Visit Diagnosis: COPD with asthma (HCC)  Patient's Home Medications on Admission:  Current Outpatient Medications:  .  acetaminophen (TYLENOL) 500 MG tablet, Take 500 mg by mouth every 6 (six) hours as needed for moderate pain., Disp: , Rfl:  .  albuterol (PROAIR HFA) 108 (90 Base) MCG/ACT inhaler, Inhale 2 puffs into the lungs every 6 (six) hours as needed for wheezing or shortness of breath., Disp: 18 g, Rfl: 2 .  amLODipine (NORVASC) 10 MG tablet, Take 10 mg by mouth daily. , Disp: , Rfl:  .  azelastine (ASTELIN) 0.1 % nasal spray, Place 2 sprays into both nostrils 2 (two) times daily., Disp: 30 mL, Rfl: 5 .  cetirizine (ZYRTEC) 10 MG tablet, Take 10 mg by mouth daily. As needed, Disp: , Rfl:  .  [START ON 05/09/2021] diphenhydrAMINE (BENADRYL) 50 MG tablet, Take 1 tablet (50 mg total) by mouth once for 1 dose. Pt to take 50 mg of Benadryl pm 05/09/21 @ 3:00 PM. Please call [phone] with any questions., Disp: 1 tablet, Rfl: 0 .  EPINEPHrine 0.3 mg/0.3 mL IJ SOAJ injection, Inject 0.3 mLs (0.3 mg total) into the muscle once. (Patient taking differently: Inject 0.3 mg into the muscle as needed for anaphylaxis.), Disp: 2 Device, Rfl: 1 .  ipratropium (ATROVENT) 0.06 % nasal spray, Place 2 sprays into both nostrils 3 (three) times daily as needed (nasal drainage)., Disp: 15 mL, Rfl: 5 .  linaclotide (LINZESS) 145 MCG CAPS capsule, Take 145 mcg by mouth daily as needed., Disp: , Rfl:  .  losartan (COZAAR) 100 MG tablet, Take 100 mg by mouth daily., Disp: , Rfl:  .  minocycline (DYNACIN) 100 MG tablet, Take  100 mg by mouth 2 (two) times daily., Disp: , Rfl:  .  Minocycline HCl 1 MG MISC, , Disp: , Rfl:  .  montelukast (SINGULAIR) 10 MG tablet, TAKE 1 TABLET BY MOUTH EVERYDAY AT BEDTIME, Disp: 90 tablet, Rfl: 3 .  NEVANAC 0.1 % ophthalmic suspension, SMARTSIG:In Eye(s), Disp: , Rfl:  .  predniSONE (DELTASONE) 50 MG tablet, Pt to take 50 mg of prednisone on 05/09/21 at 3:00 AM, 50 mg of prednisone on 05/09/21 at 9:00 AM, and 50 mg of prednisone on 05/09/21 at 3:00 PM. Pt is also to take 50 mg of benadryl on 05/09/21 at 3:00 PM. Please call [phone] with any questions., Disp: 3 tablet, Rfl: 0 .  rosuvastatin (CRESTOR) 20 MG tablet, Take 20 mg by mouth daily. , Disp: , Rfl:  .  sulfamethoxazole-trimethoprim (BACTRIM DS) 800-160 MG tablet, Take 1 tablet by mouth 2 (two) times daily., Disp: , Rfl:  .  TRELEGY ELLIPTA 100-62.5-25 MCG/INH AEPB, INHALE 1 PUFF BY MOUTH EVERY DAY, Disp: 60 each, Rfl: 5  Past Medical History: Past Medical History:  Diagnosis Date  . Anxiety   . BPH (benign prostatic hyperplasia)   . COPD (chronic obstructive pulmonary disease) (HCC)   . 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 Foley catheter in place    02-17-18, occ clots in bag  . High cholesterol   . Hypertension   . Prostate enlargement   .  Shoulder pain    right chronic  . Urinary retention     august  2018  . Wet senile macular degeneration (HCC)     right eye current tx, left eye tx in past    Tobacco Use: Social History   Tobacco Use  Smoking Status Former Smoker  . Packs/day: 1.50  . Years: 53.00  . Pack years: 79.50  . Types: Cigarettes  . Quit date: 03/04/2019  . Years since quitting: 2.1  Smokeless Tobacco Never Used    Labs: Recent Review Flowsheet Data   There is no flowsheet data to display.      Pulmonary Assessment Scores:  Pulmonary Assessment Scores    Row Name 02/19/21 1144         ADL UCSD   ADL Phase Entry     SOB Score total 61     Rest 3     Walk 3     Stairs 4     Bath  2     Dress 1     Shop 3           CAT Score   CAT Score 25           mMRC Score   mMRC Score 1            UCSD: Self-administered rating of dyspnea associated with activities of daily living (ADLs) 6-point scale (0 = "not at all" to 5 = "maximal or unable to do because of breathlessness")  Scoring Scores range from 0 to 120.  Minimally important difference is 5 units  CAT: CAT can identify the health impairment of COPD patients and is better correlated with disease progression.  CAT has a scoring range of zero to 40. The CAT score is classified into four groups of low (less than 10), medium (10 - 20), high (21-30) and very high (31-40) based on the impact level of disease on health status. A CAT score over 10 suggests significant symptoms.  A worsening CAT score could be explained by an exacerbation, poor medication adherence, poor inhaler technique, or progression of COPD or comorbid conditions.  CAT MCID is 2 points  mMRC: mMRC (Modified Medical Research Council) Dyspnea Scale is used to assess the degree of baseline functional disability in patients of respiratory disease due to dyspnea. No minimal important difference is established. A decrease in score of 1 point or greater is considered a positive change.   Pulmonary Function Assessment:  Pulmonary Function Assessment - 02/12/21 0938      Breath   Shortness of Breath Yes;Limiting activity           Exercise Target Goals: Exercise Program Goal: Individual exercise prescription set using results from initial 6 min walk test and THRR while considering  patient's activity barriers and safety.   Exercise Prescription Goal: Initial exercise prescription builds to 30-45 minutes a day of aerobic activity, 2-3 days per week.  Home exercise guidelines will be given to patient during program as part of exercise prescription that the participant will acknowledge.  Education: Aerobic Exercise: - Group verbal and visual  presentation on the components of exercise prescription. Introduces F.I.T.T principle from ACSM for exercise prescriptions.  Reviews F.I.T.T. principles of aerobic exercise including progression. Written material given at graduation.   Education: Resistance Exercise: - Group verbal and visual presentation on the components of exercise prescription. Introduces F.I.T.T principle from ACSM for exercise prescriptions  Reviews F.I.T.T. principles of resistance exercise including progression. Written material given  at graduation.    Education: Exercise & Equipment Safety: - Individual verbal instruction and demonstration of equipment use and safety with use of the equipment. Flowsheet Row Pulmonary Rehab from 03/27/2021 in [facility] Cardiac and Pulmonary Rehab  Date 02/12/21  Educator [facility]  Instruction Review Code 1- Verbalizes Understanding      Education: Exercise Physiology & General Exercise Guidelines: - Group verbal and written instruction with models to review the exercise physiology of the cardiovascular system and associated critical values. Provides general exercise guidelines with specific guidelines to those with heart or lung disease.    Education: Flexibility, Balance, Mind/Body Relaxation: - Group verbal and visual presentation with interactive activity on the components of exercise prescription. Introduces F.I.T.T principle from ACSM for exercise prescriptions. Reviews F.I.T.T. principles of flexibility and balance exercise training including progression. Also discusses the mind body connection.  Reviews various relaxation techniques to help reduce and manage stress (i.e. Deep breathing, progressive muscle relaxation, and visualization). Balance handout provided to take home. Written material given at graduation.   Activity Barriers & Risk Stratification:  Activity Barriers & Cardiac Risk Stratification - 02/19/21 1137      Activity Barriers & Cardiac Risk Stratification   Activity  Barriers Neck/Spine Problems;Shortness of Breath;Muscular Weakness;Deconditioning;Balance Concerns;Other (comment)    Comments Hx vertigo           6 Minute Walk:  6 Minute Walk    Row Name 02/19/21 1135         6 Minute Walk   Phase Initial     Distance 1390 feet     Walk Time 6 minutes     # of Rest Breaks 0     MPH 2.63     METS 2.83     RPE 11     Perceived Dyspnea  1     VO2 Peak 9.88     Symptoms Yes (comment)     Comments SOB, a little lightheaded at beginning but dissipated     Resting HR 75 bpm     Resting BP 134/60     Resting Oxygen Saturation  97 %     Exercise Oxygen Saturation  during 6 min walk 91 %     Max Ex. HR 108 bpm     Max Ex. BP 164/60     2 Minute Post BP 142/64           Interval HR   1 Minute HR 97     2 Minute HR 104     3 Minute HR 106     4 Minute HR 104     5 Minute HR 106     6 Minute HR 108     2 Minute Post HR 83     Interval Heart Rate? Yes           Interval Oxygen   Interval Oxygen? Yes     Baseline Oxygen Saturation % 97 %     1 Minute Oxygen Saturation % 92 %     1 Minute Liters of Oxygen 0 L  Room Air     2 Minute Oxygen Saturation % 92 %     2 Minute Liters of Oxygen 0 L     3 Minute Oxygen Saturation % 91 %     3 Minute Liters of Oxygen 0 L     4 Minute Oxygen Saturation % 93 %     4 Minute Liters of Oxygen 0 L  5 Minute Oxygen Saturation % 94 %     5 Minute Liters of Oxygen 0 L     6 Minute Oxygen Saturation % 93 %     6 Minute Liters of Oxygen 0 L     2 Minute Post Oxygen Saturation % 96 %     2 Minute Post Liters of Oxygen 0 L           Oxygen Initial Assessment:  Oxygen Initial Assessment - 02/12/21 0937      Home Oxygen   Home Oxygen Device None    Sleep Oxygen Prescription None    Home Exercise Oxygen Prescription None    Home Resting Oxygen Prescription None      Initial 6 min Walk   Oxygen Used None      Program Oxygen Prescription   Program Oxygen Prescription None      Intervention    Short Term Goals To learn and understand importance of monitoring SPO2 with pulse oximeter and demonstrate accurate use of the pulse oximeter.;To learn and understand importance of maintaining oxygen saturations>88%;To learn and demonstrate proper pursed lip breathing techniques or other breathing techniques.;To learn and demonstrate proper use of respiratory medications    Long  Term Goals Verbalizes importance of monitoring SPO2 with pulse oximeter and return demonstration;Maintenance of O2 saturations>88%;Exhibits proper breathing techniques, such as pursed lip breathing or other method taught during program session;Compliance with respiratory medication;Demonstrates proper use of MDI's           Oxygen Re-Evaluation:  Oxygen Re-Evaluation    Row Name 02/22/21 1100 04/24/21 1117           Program Oxygen Prescription   Program Oxygen Prescription None None             Home Oxygen   Home Oxygen Device None None      Sleep Oxygen Prescription None None      Home Exercise Oxygen Prescription None None      Home Resting Oxygen Prescription None None             Goals/Expected Outcomes   Short Term Goals To learn and understand importance of monitoring SPO2 with pulse oximeter and demonstrate accurate use of the pulse oximeter.;To learn and understand importance of maintaining oxygen saturations>88%;To learn and demonstrate proper pursed lip breathing techniques or other breathing techniques.;To learn and demonstrate proper use of respiratory medications To learn and understand importance of monitoring SPO2 with pulse oximeter and demonstrate accurate use of the pulse oximeter.;To learn and understand importance of maintaining oxygen saturations>88%;To learn and demonstrate proper pursed lip breathing techniques or other breathing techniques.;To learn and demonstrate proper use of respiratory medications      Long  Term Goals Verbalizes importance of monitoring SPO2 with pulse oximeter  and return demonstration;Maintenance of O2 saturations>88%;Exhibits proper breathing techniques, such as pursed lip breathing or other method taught during program session;Compliance with respiratory medication;Demonstrates proper use of MDI's Verbalizes importance of monitoring SPO2 with pulse oximeter and return demonstration;Maintenance of O2 saturations>88%;Exhibits proper breathing techniques, such as pursed lip breathing or other method taught during program session;Compliance with respiratory medication;Demonstrates proper use of MDI's      Comments Reviewed PLB technique with pt.  Talked about how it works and it's importance in maintaining their exercise saturations. He feels that he has had a great deal of breathing improvement coming to rehab. He still practices PLB.      Goals/Expected Outcomes Short: Become more profiecient at using PLB.  Long: Become independent at using PLB. Short: Become more profiecient at using PLB.   Long: Become independent at using PLB.             Oxygen Discharge (Final Oxygen Re-Evaluation):  Oxygen Re-Evaluation - 04/24/21 1117      Program Oxygen Prescription   Program Oxygen Prescription None      Home Oxygen   Home Oxygen Device None    Sleep Oxygen Prescription None    Home Exercise Oxygen Prescription None    Home Resting Oxygen Prescription None      Goals/Expected Outcomes   Short Term Goals To learn and understand importance of monitoring SPO2 with pulse oximeter and demonstrate accurate use of the pulse oximeter.;To learn and understand importance of maintaining oxygen saturations>88%;To learn and demonstrate proper pursed lip breathing techniques or other breathing techniques.;To learn and demonstrate proper use of respiratory medications    Long  Term Goals Verbalizes importance of monitoring SPO2 with pulse oximeter and return demonstration;Maintenance of O2 saturations>88%;Exhibits proper breathing techniques, such as pursed lip breathing  or other method taught during program session;Compliance with respiratory medication;Demonstrates proper use of MDI's    Comments He feels that he has had a great deal of breathing improvement coming to rehab. He still practices PLB.    Goals/Expected Outcomes Short: Become more profiecient at using PLB.   Long: Become independent at using PLB.           Initial Exercise Prescription:  Initial Exercise Prescription - 02/19/21 1100      Date of Initial Exercise RX and Referring Provider   Date 02/19/21    Referring Provider Daiva HugeSood, Vincent MD      Treadmill   MPH 2.2    Grade 1    Minutes 15    METs 2.84      Recumbant Elliptical   Level 1    RPM 50    Minutes 15    METs 2      REL-XR   Level 2    Speed 50    Minutes 15    METs 2      Prescription Details   Frequency (times per week) 3    Duration Progress to 30 minutes of continuous aerobic without signs/symptoms of physical distress      Intensity   THRR 40-80% of Max Heartrate 105-135    Ratings of Perceived Exertion 11-13    Perceived Dyspnea 0-4      Progression   Progression Continue to progress workloads to maintain intensity without signs/symptoms of physical distress.      Resistance Training   Training Prescription Yes    Weight 5 lb    Reps 10-15           Perform Capillary Blood Glucose checks as needed.  Exercise Prescription Changes:  Exercise Prescription Changes    Row Name 02/19/21 1100 02/25/21 1200 03/11/21 0800 03/25/21 0800 04/09/21 1600     Response to Exercise   Blood Pressure (Admit) 134/60 122/60 108/60 108/58 120/58   Blood Pressure (Exercise) 164/60 164/60 152/84 138/68 130/68   Blood Pressure (Exit) 126/60 122/64 126/64 124/64 110/58   Heart Rate (Admit) 75 bpm 74 bpm 87 bpm 69 bpm 65 bpm   Heart Rate (Exercise) 108 bpm 107 bpm 96 bpm 98 bpm 104 bpm   Heart Rate (Exit) 88 bpm 94 bpm 88 bpm 80 bpm 78 bpm   Oxygen Saturation (Admit) 97 % 97 % 96 % 96 % 97 %  Oxygen Saturation  (Exercise) 91 % 93 % 95 % 94 % 94 %   Oxygen Saturation (Exit) 96 % 96 % 96 % 96 % 96 %   Rating of Perceived Exertion (Exercise) 11 15 12 12 12    Perceived Dyspnea (Exercise) 1 3 2  0 3   Symptoms SOB, lightheaded none none none none   Comments walk test results second day -- -- --   Duration -- Progress to 30 minutes of  aerobic without signs/symptoms of physical distress Continue with 30 min of aerobic exercise without signs/symptoms of physical distress. Continue with 30 min of aerobic exercise without signs/symptoms of physical distress. Continue with 30 min of aerobic exercise without signs/symptoms of physical distress.   Intensity -- THRR unchanged THRR unchanged THRR unchanged THRR unchanged     Progression   Progression -- Continue to progress workloads to maintain intensity without signs/symptoms of physical distress. Continue to progress workloads to maintain intensity without signs/symptoms of physical distress. Continue to progress workloads to maintain intensity without signs/symptoms of physical distress. Continue to progress workloads to maintain intensity without signs/symptoms of physical distress.   Average METs -- 2.56 2.54 3.7 2.99     Resistance Training   Training Prescription -- Yes Yes Yes Yes   Weight -- 5 lb 5 lb 5 lb 5 lb   Reps -- 10-15 10-15 10-15 10-15     Interval Training   Interval Training -- -- No No No     Treadmill   MPH -- -- 2.6 2.6 2.6   Grade -- -- 1.5 1.5 1.5   Minutes -- -- 15 15 15    METs -- -- 3.53 3.53 3.53     Recumbant Bike   Level -- 2 -- -- --   RPM -- 60 -- -- --   Watts -- 30 -- -- --   Minutes -- 15 -- -- --   METs -- 2.82 -- -- --     NuStep   Level -- 2 -- -- --   SPM -- 80 -- -- --   Minutes -- 15 -- -- --   METs -- 2.3 -- -- --     Recumbant Elliptical   Level -- -- 2 -- 3   RPM -- -- -- -- 50   Minutes -- -- 15 -- 15   METs -- -- 1.9 -- 2.1     REL-XR   Level -- -- 2 3 3    Speed -- -- -- -- 80   Minutes -- --  15 15 15    METs -- -- 2.2 3.9 3.1     Track   Minutes -- -- -- -- 15   Row Name 04/24/21 0900             Response to Exercise   Blood Pressure (Admit) 138/72       Blood Pressure (Exercise) 144/62       Blood Pressure (Exit) 122/64       Heart Rate (Admit) 86 bpm       Heart Rate (Exercise) 87 bpm       Heart Rate (Exit) 82 bpm       Oxygen Saturation (Admit) 95 %       Oxygen Saturation (Exercise) 93 %       Oxygen Saturation (Exit) 93 %       Rating of Perceived Exertion (Exercise) 11       Symptoms none       Duration  Continue with 30 min of aerobic exercise without signs/symptoms of physical distress.       Intensity THRR unchanged               Progression   Progression Continue to progress workloads to maintain intensity without signs/symptoms of physical distress.       Average METs 3.5               Resistance Training   Training Prescription Yes       Weight 5 lb       Reps 10-15               Interval Training   Interval Training No               REL-XR   Level 1       Minutes 15       METs 3.5               Track   Minutes 15              Exercise Comments:   Exercise Goals and Review:  Exercise Goals    Row Name 02/19/21 1140             Exercise Goals   Increase Physical Activity Yes       Intervention Provide advice, education, support and counseling about physical activity/exercise needs.;Develop an individualized exercise prescription for aerobic and resistive training based on initial evaluation findings, risk stratification, comorbidities and participant's personal goals.       Expected Outcomes Short Term: Attend rehab on a regular basis to increase amount of physical activity.;Long Term: Add in home exercise to make exercise part of routine and to increase amount of physical activity.;Long Term: Exercising regularly at least 3-5 days a week.       Increase Strength and Stamina Yes       Intervention Provide advice, education,  support and counseling about physical activity/exercise needs.;Develop an individualized exercise prescription for aerobic and resistive training based on initial evaluation findings, risk stratification, comorbidities and participant's personal goals.       Expected Outcomes Short Term: Increase workloads from initial exercise prescription for resistance, speed, and METs.;Short Term: Perform resistance training exercises routinely during rehab and add in resistance training at home;Long Term: Improve cardiorespiratory fitness, muscular endurance and strength as measured by increased METs and functional capacity ( )       Able to understand and use rate of perceived exertion (RPE) scale Yes       Intervention Provide education and explanation on how to use RPE scale       Expected Outcomes Short Term: Able to use RPE daily in rehab to express subjective intensity level;Long Term:  Able to use RPE to guide intensity level when exercising independently       Able to understand and use Dyspnea scale Yes       Intervention Provide education and explanation on how to use Dyspnea scale       Expected Outcomes Short Term: Able to use Dyspnea scale daily in rehab to express subjective sense of shortness of breath during exertion;Long Term: Able to use Dyspnea scale to guide intensity level when exercising independently       Knowledge and understanding of Target Heart Rate Range (THRR) Yes       Intervention Provide education and explanation of THRR including how the numbers were predicted and where they are located for reference  Expected Outcomes Short Term: Able to state/look up THRR;Short Term: Able to use daily as guideline for intensity in rehab;Long Term: Able to use THRR to govern intensity when exercising independently       Able to check pulse independently Yes       Intervention Provide education and demonstration on how to check pulse in carotid and radial arteries.;Review the importance of  being able to check your own pulse for safety during independent exercise       Expected Outcomes Short Term: Able to explain why pulse checking is important during independent exercise;Long Term: Able to check pulse independently and accurately       Understanding of Exercise Prescription Yes       Intervention Provide education, explanation, and written materials on patient's individual exercise prescription       Expected Outcomes Short Term: Able to explain program exercise prescription;Long Term: Able to explain home exercise prescription to exercise independently              Exercise Goals Re-Evaluation :  Exercise Goals Re-Evaluation    Row Name 02/22/21 1059 02/25/21 1218 03/11/21 0821 03/25/21 0857 03/27/21 1115     Exercise Goal Re-Evaluation   Exercise Goals Review Increase Physical Activity;Able to understand and use rate of perceived exertion (RPE) scale;Knowledge and understanding of Target Heart Rate Range (THRR);Understanding of Exercise Prescription;Increase Strength and Stamina;Able to understand and use Dyspnea scale;Able to check pulse independently Increase Physical Activity;Increase Strength and Stamina Increase Physical Activity;Increase Strength and Stamina;Understanding of Exercise Prescription Increase Physical Activity;Increase Strength and Stamina Increase Physical Activity;Increase Strength and Stamina   Comments Reviewed RPE and dyspnea scales, THR and program prescription with pt today.  Pt voiced understanding and was given a copy of goals to take home. Sandro has done well in his first days of exercise.  Staff will monitor progress. Elmore is doing well in rehab.  He is now up to 2.6 mph on the treadmill at 1.5% grade.  He is also using 5lb weights.  We will continue to montior his progress. Dominik attends consistently.  Oxygen has stayed in the 90s all sessions.  Staff will monitor progress. Xavien plays with his dog out in the back and [place]. Moves his handicapped daughter around. He reports not playing much basketball anymore. He just renewed silver sneakers [facility]   Expected Outcomes Short: Use RPE daily to regulate intensity. Long: Follow program prescription in THR. Short:  attend consistently Long:  improve overall stamina Short: Continue to improve on METs Long: Continue to improve stamina. Short: continue to increase workloads  Long:improve overall stamina ST: continue to attend rehab, [facility] back to the [facility] ZO:[id number] overall stamina   Row Name 04/09/21 1607 04/24/21 1001 04/24/21 1115         Exercise Goal Re-Evaluation   Exercise Goals Review Increase Physical Activity;Increase Strength and Stamina Increase Physical Activity;Increase Strength and Stamina Increase Physical Activity;Increase Strength and Stamina     Comments Santhosh is doing well in rehab. We have opened up the track and he was able to use it the other day. Oxygen has been maintained well during exercise. Will continue to monitor. Jahmir attends consistently.  He doesnt reach THR range.  Staff will review THR with Jena Gauss. Jamil plays with his dog out in the back and does [place] to trash. Moves his handicapped daughter around. He reports not playing much basketball anymore. He just renewed silver sneakers back in April [facility] He feels that he does enough to keep him busy outside of rehab - discussed how that was activity and not structured exercise.     Expected Outcomes Short: Increase number of laps on track Long: Obtain ability to exercise independently at home Short:  work towards target HR range  Long:  improve overall stamina Short:  work towards target HR range, do some structured exercise outside of rehab Long:  improve overall stamina            Discharge Exercise Prescription (Final Exercise Prescription Changes):  Exercise  Prescription Changes - 04/24/21 0900      Response to Exercise   Blood Pressure (Admit) 138/72    Blood Pressure (Exercise) 144/62    Blood Pressure (Exit) 122/64    Heart Rate (Admit) 86 bpm    Heart Rate (Exercise) 87 bpm    Heart Rate (Exit) 82 bpm    Oxygen Saturation (Admit) 95 %    Oxygen Saturation (Exercise) 93 %    Oxygen Saturation (Exit) 93 %    Rating of Perceived Exertion (Exercise) 11    Symptoms none    Duration Continue with 30 min of aerobic exercise without signs/symptoms of physical distress.    Intensity THRR unchanged      Progression   Progression Continue to progress workloads to maintain intensity without signs/symptoms of physical distress.    Average METs 3.5      Resistance Training   Training Prescription Yes    Weight 5 lb    Reps 10-15      Interval Training   Interval Training No      REL-XR   Level 1    Minutes 15    METs 3.5      Track   Minutes 15           Nutrition:  Target Goals: Understanding of nutrition guidelines, daily intake of sodium 1500mg , cholesterol 200mg , calories 30% from fat and 7% or less from saturated fats, daily to have 5 or more servings of fruits and vegetables.  Education: All About Nutrition: -Group instruction provided by verbal, written material, interactive activities, discussions, models, and posters to present general guidelines for heart healthy nutrition including fat, fiber, MyPlate, the role of sodium in heart healthy nutrition, utilization of the nutrition label, and utilization of this knowledge for meal planning. Follow up email sent as well. Written material given at graduation.   Biometrics:  Pre Biometrics - 02/19/21 1140      Pre Biometrics   Height 6' 0.5" (1.842 m)    Weight 251 lb 6.4 oz (114 kg)    BMI (Calculated) 33.61    Single Leg Stand 5 seconds            Nutrition Therapy Plan and Nutrition Goals:  Nutrition Therapy & Goals - 03/18/21 1115      Nutrition Therapy    Diet Heart healthy, low Na, Pulmonary MNT    Drug/Food Interactions Statins/Certain Fruits    Protein (specify units) 90g    Fiber 30 grams    Whole Grain Foods 3 servings    Saturated Fats 12 max. grams    Fruits and Vegetables 8 servings/day    Sodium 1.5 grams      Personal Nutrition Goals  Nutrition Goal ST: vegetable dip, raw or roasted vegetables as snack at least 3x/week. LT: eat at least 5 fruit and vegetable servings per day, limit saturated fat to 12g/day, limit red meat and pork to 1-2x/week.    Comments Ulric reports losing 3 lbs since being here. He reports no difficulty breathing with eating. No restrictions on fluid or sodium from doctor. He reports eating whenever he feels like it - normally doesn't eat breakfast, started. B: oatmeal (rolled) cranberries and honey with (2 patties- pork) sausage with OJ. D: whatever wife fixes. He enjoys meat - chicken, pork and red meat (5x/week). He reports not caring much for vegetables - will eat string beans or salad every now and again - texture. He likes broccoli and brussells sprouts as well - steam. He reports snacking on potato chips or popcorn. He reports loving the fat on his meat. He also reports that is is hard to modify dinner as his wife is the one cooking. Discussed heart healthy eating.  Discussed modifying snack time to include some more fruit/vegetables as dinner is hard to modify.      Intervention Plan   Intervention Prescribe, educate and counsel regarding individualized specific dietary modifications aiming towards targeted core components such as weight, hypertension, lipid management, diabetes, heart failure and other comorbidities.;Nutrition handout(s) given to patient.    Expected Outcomes Short Term Goal: Understand basic principles of dietary content, such as calories, fat, sodium, cholesterol and nutrients.;Short Term Goal: A plan has been developed with personal nutrition goals set during dietitian appointment.;Long Term  Goal: Adherence to prescribed nutrition plan.           Nutrition Assessments:  MEDIFICTS Score Key:  ?70 Need to make dietary changes   40-70 Heart Healthy Diet  ? 40 Therapeutic Level Cholesterol Diet  Flowsheet Row Pulmonary Rehab from 02/19/2021 in [facility]  Picture Your Plate Total Score on Admission 44     Picture Your Plate Scores:  <40 Unhealthy dietary pattern with much room for improvement.  41-50 Dietary pattern unlikely to meet recommendations for good health and room for improvement.  51-60 More healthful dietary pattern, with some room for improvement.   >60 Healthy dietary pattern, although there may be some specific behaviors that could be improved.   Nutrition Goals Re-Evaluation:  Nutrition Goals Re-Evaluation    Row Name 03/27/21 1115 04/24/21 1123           Goals   Current Weight -- 144 lb 6.4 oz (65.5 kg)      Nutrition Goal ST: Continue to eat vegetables with dinner, use tortilla chips or whole wheat pita chips or raw vegetables instead of potato chips. LT: eat at least 5 fruit and vegetable servings per day, limit saturated fat to 12g/day, limit red meat and pork to 1-2x/week. ST: eat at the table (sitting upright) for meals LT: eat at least 5 fruit and vegetable servings per day, limit saturated fat to 12g/day, limit red meat and pork to 1-2x/week.      Comment He reports eating some roasted vegetables - red baby potatoes, orange and red peppers, broccoli, brussells sprouts, carrots. He has been enjoying this. He is eating about a cup and a half of vegetables now with dinner. He likes avocado with potato chips. He reports eating the tortilla chips and salsa instead of potato chips. He reports knowing he should cut back on red meat but he does not want to and he just bought a new grill.  He has not made any other changes, but would like to sit down at the table to eat.      Expected Outcome ST: Continue to eat vegetables with  dinner, use tortilla chips or whole wheat pita chips or raw vegetables instead of potato chips. LT: eat at least 5 fruit and vegetable servings per day, limit saturated fat to 12g/day, limit red meat and pork to 1-2x/week. ST: eat at the table (sitting upright) for meals LT: eat at least 5 fruit and vegetable servings per day, limit saturated fat to 12g/day, limit red meat and pork to 1-2x/week.             Nutrition Goals Discharge (Final Nutrition Goals Re-Evaluation):  Nutrition Goals Re-Evaluation - 04/24/21 1123      Goals   Current Weight 144 lb 6.4 oz (65.5 kg)    Nutrition Goal ST: eat at the table (sitting upright) for meals LT: eat at least 5 fruit and vegetable servings per day, limit saturated fat to 12g/day, limit red meat and pork to 1-2x/week.    Comment He reports eating the tortilla chips and salsa instead of potato chips. He reports knowing he should cut back on red meat but he does not want to and he just bought a new grill. He has not made any other changes, but would like to sit down at the table to eat.    Expected Outcome ST: eat at the table (sitting upright) for meals LT: eat at least 5 fruit and vegetable servings per day, limit saturated fat to 12g/day, limit red meat and pork to 1-2x/week.           Psychosocial: Target Goals: Acknowledge presence or absence of significant depression and/or stress, maximize coping skills, provide positive support system. Participant is able to verbalize types and ability to use techniques and skills needed for reducing stress and depression.   Education: Stress, Anxiety, and Depression - Group verbal and visual presentation to define topics covered.  Reviews how body is impacted by stress, anxiety, and depression.  Also discusses healthy ways to reduce stress and to treat/manage anxiety and depression.  Written material given at graduation. Flowsheet Row Pulmonary Rehab from 03/27/2021 in [facility] Pulmonary Rehab  Date  03/27/21  Educator [facility]  Instruction Review Code 1- [employer] Understanding      Education: Sleep Hygiene -Provides group verbal and written instruction about how sleep can affect your health.  Define sleep hygiene, discuss sleep cycles and impact of sleep habits. Review good sleep hygiene tips.    Initial Review & Psychosocial Screening:  Initial Psych Review & Screening - 02/12/21 0941      Initial Review   Current issues with None Identified      Family Dynamics   Good Support System? Yes    Comments He can look to his wife of 50 years and daughter. He has two other children who he talkes to.      Barriers   Psychosocial barriers to participate in program There are no identifiable barriers or psychosocial needs.;The patient should benefit from training in stress management and relaxation.      Screening Interventions   Interventions Encouraged to exercise;To provide support and resources with identified psychosocial needs;Provide feedback about the scores to participant           Quality of Life Scores:  Scores of 19 and below usually indicate a poorer quality of life in these areas.  A difference of  2-3 points  is a clinically meaningful difference.  A difference of 2-3 points in the total score of the Quality of Life Index has been associated with significant improvement in overall quality of life, self-image, physical symptoms, and general health in studies assessing change in quality of life.  PHQ-9: Recent Review Flowsheet Data    Depression screen [facility] 2/9 02/19/2021   Decreased Interest 2   Down, Depressed, Hopeless 0   PHQ - 2 Score 2   Altered sleeping 2   Tired, decreased energy 2   Change in appetite 3   Feeling bad or failure about yourself  0   Trouble concentrating 1   Moving slowly or fidgety/restless 1   Suicidal thoughts 0   PHQ-9 Score 11   Difficult doing work/chores Somewhat difficult     Interpretation of Total Score  Total Score Depression  Severity:  1-4 = Minimal depression, 5-9 = Mild depression, 10-14 = Moderate depression, 15-19 = Moderately severe depression, 20-27 = Severe depression   Psychosocial Evaluation and Intervention:  Psychosocial Evaluation - 02/12/21 0942      Psychosocial Evaluation & Interventions   Interventions Encouraged to exercise with the program and follow exercise prescription;Relaxation education;Stress management education    Comments He can look to his wife of 50 years and daughter. He has two other children who he talkes to.    Expected Outcomes Short: Exercise regularly to support mental health and notify staff of any changes. Long: maintain mental health and well being through teaching of rehab or prescribed medications independently.    Continue Psychosocial Services  Follow up required by staff           Psychosocial Re-Evaluation:  Psychosocial Re-Evaluation    Row Name 03/27/21 1123 04/24/21 1125           Psychosocial Re-Evaluation   Current issues with Current Stress Concerns Current Stress Concerns      Comments His main stressor is his handicapped daughter - didn't have a caregiver for a while due to COVID, now has a new caregiver that just started Monday. He enjoys being on his conputer, stays active, plays with dog, and hangs out with his grandson and plays basketball. He reports a good support system with son, wife, and mother-in-law. He reports no sleep concerns. He tried to get a new caregiver last month, but she never returned so they are back to being the sole caregivers for his daughter. he continues to stay active and positive.      Expected Outcomes ST: continue to do activities to reduce stress LT: maintain positive attitude ST: continue to do activities to reduce stress LT: maintain positive attitude      Interventions Encouraged to attend Cardiac Rehabilitation for the exercise Encouraged to attend Cardiac Rehabilitation for the exercise      Continue Psychosocial  Services  Follow up required by staff Follow up required by staff             Initial Review   Source of Stress Concerns Family Family             Psychosocial Discharge (Final Psychosocial Re-Evaluation):  Psychosocial Re-Evaluation - 04/24/21 1125      Psychosocial Re-Evaluation   Current issues with Current Stress Concerns    Comments He tried to get a new caregiver last month, but she never returned so they are back to being the sole caregivers for his daughter. he continues to stay active and positive.    Expected Outcomes ST: continue  to do activities to reduce stress LT: maintain positive attitude    Interventions Encouraged to attend Cardiac Rehabilitation for the exercise    Continue Psychosocial Services  Follow up required by staff      Initial Review   Source of Stress Concerns Family           Education: Education Goals: Education classes will be provided on a weekly basis, covering required topics. Participant will state understanding/return demonstration of topics presented.  Learning Barriers/Preferences:  Learning Barriers/Preferences - 02/12/21 9604      Learning Barriers/Preferences   Learning Barriers Hearing    Learning Preferences None           General Pulmonary Education Topics:  Infection Prevention: - Provides verbal and written material to individual with discussion of infection control including proper hand washing and proper equipment cleaning during exercise session. Flowsheet Row Pulmonary Rehab from 03/27/2021 in [facility] Cardiac and Pulmonary Rehab  Date 02/12/21  Educator [facility]  Instruction Review Code 1- Verbalizes Understanding      Falls Prevention: - Provides verbal and written material to individual with discussion of falls prevention and safety. Flowsheet Row Pulmonary Rehab from 03/27/2021 in [facility] Card[facility] and Pulmonary Rehab  Date 02/12/21  Educator [facility]  Instruction Review Code 1- Verbalizes Understanding      Chronic Lung  Disease Review: - Group verbal instruction with posters, models, PowerPoint presentations and videos,  to review new updates, new respiratory medications, new advancements in procedures and treatments. Providing information on websites and "800" numbers for continued self-education. Includes information about supplement oxygen, available portable oxygen systems, continuous and intermittent flow rates, oxygen safety, concentrators, and Medicare reimbursement for oxygen. Explanation of Pulmonary Drugs, including class, frequency, complications, importance of spacers, rinsing mouth after steroid MDI's, and proper cleaning methods for nebulizers. Review of basic lung anatomy and physiology related to function, structure, and complications of lung disease. Review of risk factors. Discussion about methods for diagnosing sleep apnea and types of masks and machines for OSA. Includes a review of the use of types of environmental controls: home humidity, furnaces, filters, dust mite/pet prevention, HEPA vacuums. Discussion about weather changes, air quality and the benefits of nasal washing. Instruction on Warning signs, infection symptoms, calling MD promptly, preventive modes, and value of vaccinations. Review of effective airway clearance, coughing and/or vibration techniques. Emphasizing that all should Create an Action Plan. Written material given at graduation. Flowsheet Row Pulmonary Rehab from 03/27/2021 in [facility] Pulmonary Rehab  Education need identified 02/19/21  Date 03/20/21  Educator [facility]  Instruction Review Code 1- Verbalizes Understanding      AED/CPR: - Group verbal and written instruction with the use of models to demonstrate the basic use of the AED with the basic ABC's of resuscitation.    Anatomy and Cardiac Procedures: - Group verbal and visual presentation and models provide information about basic cardiac anatomy and function. Reviews the testing methods done to diagnose heart  disease and the outcomes of the test results. Describes the treatment choices: Medical Management, Angioplasty, or Coronary Bypass Surgery for treating various heart conditions including Myocardial Infarction, Angina, Valve Disease, and Cardiac Arrhythmias.  Written material given at graduation.   Medication Safety: - Group verbal and visual instruction to review commonly prescribed medications for heart and lung disease. Reviews the medication, class of the drug, and side effects. Includes the steps to properly store meds and maintain the prescription regimen.  Written material given at graduation. Flowsheet Row Pulmonary Rehab from 03/27/2021  in [facility] Cardiac and Pulmonary Rehab  Date 03/06/21  Educator [facility]  Instruction Review Code 1- Verbalizes Understanding      Other: -Provides group and verbal instruction on various topics (see comments)   Knowledge Questionnaire Score:  Knowledge Questionnaire Score - 02/19/21 1141      Knowledge Questionnaire Score   Pre Score 16/18 Education Focus: O2 safety            Core Components/Risk Factors/Patient Goals at Admission:  Personal Goals and Risk Factors at Admission - 02/19/21 1142      Core Components/Risk Factors/Patient Goals on Admission    Weight Management Yes;Weight Loss;Obesity    Intervention Weight Management: Develop a combined nutrition and exercise program designed to reach desired caloric intake, while maintaining appropriate intake of nutrient and fiber, sodium and fats, and appropriate energy expenditure required for the weight goal.;Weight Management/Obesity: Establish reasonable short term and long term weight goals.;Obesity: Provide education and appropriate resources to help participant work on and attain dietary goals.;Weight Management: Provide education and appropriate resources to help participant work on and attain dietary goals.    Admit Weight 251 lb 6.4 oz (114 kg)    Goal Weight: Short Term 246 lb (111.6 kg)     Goal Weight: Long Term 240 lb (108.9 kg)    Expected Outcomes Short Term: Continue to assess and modify interventions until short term weight is achieved;Long Term: Adherence to nutrition and physical activity/exercise program aimed toward attainment of established weight goal;Weight Loss: Understanding of general recommendations for a balanced deficit meal plan, which promotes 1-2 lb weight loss per week and includes a negative energy balance of [phone] kcal/d;Understanding recommendations for meals to include 15-35% energy as protein, 25-35% energy from fat, 35-60% energy from carbohydrates, less than 200mg  of dietary cholesterol, 20-35 gm of total fiber daily;Understanding of distribution of calorie intake throughout the day with the consumption of 4-5 meals/snacks    Improve shortness of breath with ADL's Yes    Intervention Provide education, individualized exercise plan and daily activity instruction to help decrease symptoms of SOB with activities of daily living.    Expected Outcomes Long Term: Be able to perform more ADLs without symptoms or delay the onset of symptoms;Short Term: Improve cardiorespiratory fitness to achieve a reduction of symptoms when performing ADLs    Hypertension Yes    Intervention Provide education on lifestyle modifcations including regular physical activity/exercise, weight management, moderate sodium restriction and increased consumption of fresh fruit, vegetables, and low fat dairy, alcohol moderation, and smoking cessation.;Monitor prescription use compliance.    Expected Outcomes Short Term: Continued assessment and intervention until BP is < 140/64mm HG in hypertensive participants. < 130/40mm HG in hypertensive participants with diabetes, heart failure or chronic kidney disease.;Long Term: Maintenance of blood pressure at goal levels.    Lipids Yes    Intervention Provide education and support for participant on nutrition & aerobic/resistive exercise along with  prescribed medications to achieve LDL 70mg , HDL >40mg .    Expected Outcomes Short Term: Participant states understanding of desired cholesterol values and is compliant with medications prescribed. Participant is following exercise prescription and nutrition guidelines.;Long Term: Cholesterol controlled with medications as prescribed, with individualized exercise RX and with personalized nutrition plan. Value goals: LDL < 70mg , HDL > 40 mg.           Education:Diabetes - Individual verbal and written instruction to review signs/symptoms of diabetes, desired ranges of glucose level fasting, after meals and with exercise. Acknowledge that pre and  post exercise glucose checks will be done for 3 sessions at entry of program.   Know Your Numbers and Heart Failure: - Group verbal and visual instruction to discuss disease risk factors for cardiac and pulmonary disease and treatment options.  Reviews associated critical values for Overweight/Obesity, Hypertension, Cholesterol, and Diabetes.  Discusses basics of heart failure: signs/symptoms and treatments.  Introduces Heart Failure Zone chart for action plan for heart failure.  Written material given at graduation. Flowsheet Row Pulmonary Rehab from 03/27/2021 in [facility] Card[facility] Pulmonary Rehab  Date 03/13/21  Educator [facility]  Instruction Review Code 1- Verbalizes Understanding      Core Components/Risk Factors/Patient Goals Review:   Goals and Risk Factor Review    Row Name 03/27/21 1109 04/24/21 1119           Core Components/Risk Factors/Patient Goals Review   Personal Goals Review Hypertension;Weight Management/Obesity Hypertension;Weight Management/Obesity;Improve shortness of breath with ADL's      Review Hurman reports losing weight since coming to rehab - about 2 pounds: 246.5 lbs. He would still like to lose weight - goal 200 lbs. He rpeorts in the summer he tends to weight less due to the increase in activity over the summer. He does not  check his BP at home, BP today was 128/78. He has a BP cuff at home - the one he has at home tends to run higher. Still taking medications as prescirbed. He feels that he has had a great deal of breathing improvement coming to rehab. He still practices PLB. He checks it occassionally and feels his BP is increasing a bit - he feels this is due to his gut issues - he is going to the gastroenterologist this Friday. His BP today was 136/74; his cuff can run 145/80s. He does not want to bring his BP cuff in to check it. He feels he has lost weight since starting.      Expected Outcomes ST: bring BP cuff in to check with our cuffs LT: lose 10 lbs during rehab ST:  continue to manage risk factors LT: lose 10 lbs during rehab             Core Components/Risk Factors/Patient Goals at Discharge (Final Review):   Goals and Risk Factor Review - 04/24/21 1119      Core Components/Risk Factors/Patient Goals Review   Personal Goals Review Hypertension;Weight Management/Obesity;Improve shortness of breath with ADL's    Review He feels that he has had a great deal of breathing improvement coming to rehab. He still practices PLB. He checks it occassionally and feels his BP is increasing a bit - he feels this is due to his gut issues - he is going to the gastroenterologist this Friday. His BP today was 136/74; his cuff can run 145/80s. He does not want to bring his BP cuff in to check it. He feels he has lost weight since starting.    Expected Outcomes ST:  continue to manage risk factors LT: lose 10 lbs during rehab           ITP Comments:  ITP Comments    Row Name 02/12/21 0939 02/19/21 1134 02/22/21 1059 03/11/21 0821 03/13/21 0702   ITP Comments Virtual Visit completed. Patient informed on EP and RD appointment and 6 Minute walk test. Patient also informed of patient health questionnaires on My Chart. Patient Verbalizes understanding. Visit diagnosis can be found in [facility] 01/25/2021. Completed and gym  orientation. Initial ITP created and sent for review  to Dr. Bethann Punches, Medical Director. First full day of exercise!  Patient was oriented to gym and equipment including functions, settings, policies, and procedures.  Patient's individual exercise prescription and treatment plan were reviewed.  All starting workloads were established based on the results of the 6 minute walk test done at initial orientation visit.  The plan for exercise progression was also introduced and progression will be customized based on patient's performance and goals. Curley called out today with a bad reaction to something he ate(?) and had to take benedryl and not feeling well enough to come into class today. 30 Day review completed. Medical Director ITP review done, changes made as directed, and signed approval by Medical Director.   Row Name 04/10/21 0856 05/01/21 0849 05/03/21 1121       ITP Comments 30 Day review completed. Medical Director ITP review done, changes made as directed, and signed approval by Medical Director. Manjinder called to let us know that he changed his medication on Monday.  His Linzess is now daily verus every other day and he is having some GI distress from the increase.  He hopes to come on Friday if feeling better.  He also has a CT scan with contrast dye next Tuesday and will need to be premedicated for that and will keep [place] posted. Tysheem called yesterday and requested to discharge from the program as they figure out what is going on and he just wants time to recover.            Comments: Discharge ITP

## 2021-05-05 ENCOUNTER — Other Ambulatory Visit: Payer: Self-pay | Admitting: Pulmonary Disease

## 2021-05-07 ENCOUNTER — Encounter (INDEPENDENT_AMBULATORY_CARE_PROVIDER_SITE_OTHER): Payer: Medicare Other | Admitting: Ophthalmology

## 2021-05-07 ENCOUNTER — Other Ambulatory Visit: Payer: Self-pay

## 2021-05-07 DIAGNOSIS — H43813 Vitreous degeneration, bilateral: Secondary | ICD-10-CM | POA: Diagnosis not present

## 2021-05-07 DIAGNOSIS — I10 Essential (primary) hypertension: Secondary | ICD-10-CM

## 2021-05-07 DIAGNOSIS — H353231 Exudative age-related macular degeneration, bilateral, with active choroidal neovascularization: Secondary | ICD-10-CM | POA: Diagnosis not present

## 2021-05-07 DIAGNOSIS — H35033 Hypertensive retinopathy, bilateral: Secondary | ICD-10-CM | POA: Diagnosis not present

## 2021-05-09 ENCOUNTER — Ambulatory Visit
Admission: RE | Admit: 2021-05-09 | Discharge: 2021-05-09 | Disposition: A | Discharge status: Home / Self Care | Payer: Medicare Other | Source: Ambulatory Visit | Attending: Physician Assistant | Admitting: Physician Assistant

## 2021-05-09 ENCOUNTER — Other Ambulatory Visit: Payer: Self-pay

## 2021-05-09 DIAGNOSIS — R1030 Lower abdominal pain, unspecified: Secondary | ICD-10-CM

## 2021-05-09 MED ORDER — IOPAMIDOL (ISOVUE-300) INJECTION 61%
100.0000 mL | Freq: Once | INTRAVENOUS | Status: AC | PRN
  Administered 2021-05-09: 100 mL via INTRAVENOUS

## 2021-05-14 ENCOUNTER — Ambulatory Visit (INDEPENDENT_AMBULATORY_CARE_PROVIDER_SITE_OTHER): Payer: Medicare Other | Admitting: Pulmonary Disease

## 2021-05-14 ENCOUNTER — Encounter: Payer: Self-pay | Admitting: Pulmonary Disease

## 2021-05-14 ENCOUNTER — Other Ambulatory Visit: Payer: Self-pay

## 2021-05-14 VITALS — BP 118/62 | HR 60 | Temp 97.3°F | Ht 71.0 in | Wt 243.4 lb

## 2021-05-14 DIAGNOSIS — J301 Allergic rhinitis due to pollen: Secondary | ICD-10-CM | POA: Diagnosis not present

## 2021-05-14 DIAGNOSIS — J449 Chronic obstructive pulmonary disease, unspecified: Secondary | ICD-10-CM

## 2021-05-14 NOTE — Patient Instructions (Signed)
Follow up in December 2022 after you have your repeat CT chest

## 2021-05-14 NOTE — Progress Notes (Signed)
[facility]  Chief Complaint  Patient presents with  . Follow-up    Sob slightly worse since stopping rehab. Going to start at the [facility]. Cough occass. clear    Constitutional:  BP 118/62 (BP Location: Right Arm, Cuff Size: Large)   Pulse 60   Temp (!) 97.3 F (36.3 C) (Temporal)   Ht 5\' 11"  (1.803 m)   Wt 243 lb 6.4 oz (110.4 kg)   SpO2 97%   BMI 33.95 kg/m   Past Medical History:  HTN, HLD, Prostatitis, Macular degeneration, BPH, COVID 19 infection January 2022, Diverticulosis  Past Surgical History:  He  has a past surgical history that includes left ring finger surgery (1970's); Tonsillectomy (as child); colonscopy (few yrs ago); sigmoidscopy; divertilosis; and Transurethral resection of prostate (N/A, 03/19/2018).  Brief Summary:  Tim Thomas is a 70 y.o. male former smoker with COPD/asthma.      Subjective:   He had COVID infection last winter.  Recovered from this w/o needing specific therapy.  Was enrolled in pulmonary rehab.  He had to drop out due to lower abdominal issues and soreness in his right achilles tendon.  He plans to enroll in 03-09-1986.  Not having cough, wheeze, or sputum.  Sleeping okay.   Physical Exam:   Appearance - well kempt   ENMT - no sinus tenderness, no oral exudate, no LAN, Mallampati 3 airway, no stridor  Respiratory - equal breath sounds bilaterally, no wheezing or rales  CV - s1s2 regular rate and rhythm, no murmurs  Ext - no clubbing, no edema  Skin - no rashes  Psych - normal mood and affect     Pulmonary testing:   PFT 05/12/12>>FEV1 3.37(99%), FEV1% 68, TLC 7.33(103%), DLCO 116%, +BD  Spirometry 08/20/15 >> FEV1 3.30 (105%), FEV1% 76  Spirometry 10/01/18 >> FEV1 3.2 (108%), FEV1% 73  PFT 11/02/19 >> FEV1 3.64 (119%), FEV1% 75, TLC 8.31 (115%), DLCO 120%, +BD from FEF 25-75%  Allergy test 08/23/20 >> reaction to everything  Chest Imaging:   Low dose CT  chest 09/02/16 >> paraseptal emphysema, bronchial wall thickening  Low dose CT chest 11/23/19 >> emphysema, atherosclerosis  Low dose CT chest 11/29/20 >> no change  Cardiac Tests:   Echo 05/24/19 >> EF above 65%, impaired relaxation  Social History:  He  reports that he quit smoking about 2 years ago. His smoking use included cigarettes. He has a 79.50 pack-year smoking history. He has never used smokeless tobacco. He reports current alcohol use. He reports that he does not use drugs.  Family History:  His family history includes COPD in his mother; Lung cancer in his mother; Thyroid disease in his sister.     Assessment/Plan:   COPD with asthma and emphysema. - continue trelegy and singulair - prn albuterol  Allergic rhinitis with conjunctivitis and food allergy. - followed by Dr. 07/24/19 with Allergy and Asthma - continue OTC antihistamine and singulair - continue flonase  History of tobacco abuse. - f/u LDCT chest in December 2022  Time Spent Involved in Patient Care on Day of Examination:  22 minutes  Follow up:  Patient Instructions  Follow up in December 2022 after you have your repeat CT chest  Medication List:   Allergies as of 05/14/2021      Reactions   Bee Venom Swelling   Facial and sit of sting swelling Has EPi-pen   Ciprofloxacin Other (See Comments)   Neck stiffness and pains  Ivp Dye [iodinated Diagnostic Agents] Hives, Other (See Comments)   Shellfish Allergy    Per patient's allergist to avoid shellfish due to allergy to contrast dye.       Medication List       Accurate as of May 14, 2021 11:58 AM. If you have any questions, ask your nurse or doctor.        STOP taking these medications   azelastine 0.1 % nasal spray Commonly known as: ASTELIN Stopped by: Coralyn Helling, MD   Minocycline HCl 1 MG Misc Stopped by: Coralyn Helling, MD   predniSONE 50 MG tablet Commonly known as: DELTASONE Stopped by: Coralyn Helling, MD    sulfamethoxazole-trimethoprim 800-160 MG tablet Commonly known as: BACTRIM DS Stopped by: Coralyn Helling, MD     TAKE these medications   acetaminophen 500 MG tablet Commonly known as: TYLENOL Take 500 mg by mouth every 6 (six) hours as needed for moderate pain.   albuterol 108 (90 Base) MCG/ACT inhaler Commonly known as: VENTOLIN HFA TAKE 2 PUFFS BY MOUTH EVERY 6 HOURS AS NEEDED FOR WHEEZE OR SHORTNESS OF BREATH   amLODipine 10 MG tablet Commonly known as: NORVASC Take 10 mg by mouth daily.   cetirizine 10 MG tablet Commonly known as: ZYRTEC Take 10 mg by mouth daily. As needed   diphenhydrAMINE 50 MG tablet Commonly known as: BENADRYL Take 1 tablet (50 mg total) by mouth once for 1 dose. Pt to take 50 mg of Benadryl pm 05/09/21 @ 3:00 PM. Please call [phone] with any questions.   EPINEPHrine 0.3 mg/0.3 mL Soaj injection Commonly known as: EPI-PEN Inject 0.3 mLs (0.3 mg total) into the muscle once. What changed:   when to take this  reasons to take this   ipratropium 0.06 % nasal spray Commonly known as: ATROVENT Place 2 sprays into both nostrils 3 (three) times daily as needed (nasal drainage).   linaclotide 145 MCG Caps capsule Commonly known as: LINZESS Take 145 mcg by mouth daily as needed.   losartan 100 MG tablet Commonly known as: COZAAR Take 100 mg by mouth daily.   minocycline 100 MG tablet Commonly known as: DYNACIN Take 100 mg by mouth 2 (two) times daily.   montelukast 10 MG tablet Commonly known as: SINGULAIR TAKE 1 TABLET BY MOUTH EVERYDAY AT BEDTIME   Nevanac 0.1 % ophthalmic suspension Generic drug: nepafenac SMARTSIG:In Eye(s)   rosuvastatin 20 MG tablet Commonly known as: CRESTOR Take 20 mg by mouth daily.   Trelegy Ellipta 100-62.5-25 MCG/INH Aepb Generic drug: Fluticasone-Umeclidin-Vilant INHALE 1 PUFF BY MOUTH EVERY DAY       Signature:  Coralyn Helling, MD [facility] Pager - [phone] 05/14/2021, 11:58 AM

## 2021-05-26 ENCOUNTER — Other Ambulatory Visit: Payer: Self-pay | Admitting: Pulmonary Disease

## 2021-08-20 ENCOUNTER — Other Ambulatory Visit: Payer: Self-pay

## 2021-08-20 ENCOUNTER — Encounter (INDEPENDENT_AMBULATORY_CARE_PROVIDER_SITE_OTHER): Payer: Medicare Other | Admitting: Ophthalmology

## 2021-08-20 DIAGNOSIS — H43813 Vitreous degeneration, bilateral: Secondary | ICD-10-CM

## 2021-08-20 DIAGNOSIS — I10 Essential (primary) hypertension: Secondary | ICD-10-CM

## 2021-08-20 DIAGNOSIS — H353231 Exudative age-related macular degeneration, bilateral, with active choroidal neovascularization: Secondary | ICD-10-CM

## 2021-08-20 DIAGNOSIS — H35033 Hypertensive retinopathy, bilateral: Secondary | ICD-10-CM | POA: Diagnosis not present

## 2021-08-22 ENCOUNTER — Ambulatory Visit: Payer: Medicare Other | Admitting: Allergy

## 2021-12-03 ENCOUNTER — Encounter (INDEPENDENT_AMBULATORY_CARE_PROVIDER_SITE_OTHER): Payer: Medicare Other | Admitting: Ophthalmology

## 2021-12-03 ENCOUNTER — Other Ambulatory Visit: Payer: Self-pay

## 2021-12-03 DIAGNOSIS — H2512 Age-related nuclear cataract, left eye: Secondary | ICD-10-CM

## 2021-12-03 DIAGNOSIS — H2701 Aphakia, right eye: Secondary | ICD-10-CM

## 2021-12-03 DIAGNOSIS — I10 Essential (primary) hypertension: Secondary | ICD-10-CM | POA: Diagnosis not present

## 2021-12-03 DIAGNOSIS — H43813 Vitreous degeneration, bilateral: Secondary | ICD-10-CM | POA: Diagnosis not present

## 2021-12-03 DIAGNOSIS — H353231 Exudative age-related macular degeneration, bilateral, with active choroidal neovascularization: Secondary | ICD-10-CM | POA: Diagnosis not present

## 2021-12-03 DIAGNOSIS — H35033 Hypertensive retinopathy, bilateral: Secondary | ICD-10-CM | POA: Diagnosis not present

## 2021-12-07 ENCOUNTER — Other Ambulatory Visit: Payer: Self-pay | Admitting: Pulmonary Disease

## 2022-01-01 ENCOUNTER — Other Ambulatory Visit: Payer: Self-pay | Admitting: Pulmonary Disease

## 2022-02-07 ENCOUNTER — Other Ambulatory Visit: Payer: Self-pay

## 2022-02-07 DIAGNOSIS — Z87891 Personal history of nicotine dependence: Secondary | ICD-10-CM

## 2022-02-25 ENCOUNTER — Ambulatory Visit
Admission: RE | Admit: 2022-02-25 | Discharge: 2022-02-25 | Disposition: A | Discharge status: Home / Self Care | Payer: Medicare Other | Source: Ambulatory Visit | Attending: Acute Care | Admitting: Acute Care

## 2022-02-25 ENCOUNTER — Other Ambulatory Visit: Payer: Self-pay

## 2022-02-25 DIAGNOSIS — Z87891 Personal history of nicotine dependence: Secondary | ICD-10-CM

## 2022-02-27 ENCOUNTER — Other Ambulatory Visit: Payer: Self-pay

## 2022-02-27 DIAGNOSIS — Z87891 Personal history of nicotine dependence: Secondary | ICD-10-CM

## 2022-03-18 ENCOUNTER — Other Ambulatory Visit: Payer: Self-pay

## 2022-03-18 ENCOUNTER — Encounter (INDEPENDENT_AMBULATORY_CARE_PROVIDER_SITE_OTHER): Payer: Medicare Other | Admitting: Ophthalmology

## 2022-03-18 DIAGNOSIS — H43813 Vitreous degeneration, bilateral: Secondary | ICD-10-CM | POA: Diagnosis not present

## 2022-03-18 DIAGNOSIS — I10 Essential (primary) hypertension: Secondary | ICD-10-CM

## 2022-03-18 DIAGNOSIS — H35033 Hypertensive retinopathy, bilateral: Secondary | ICD-10-CM

## 2022-03-18 DIAGNOSIS — H353231 Exudative age-related macular degeneration, bilateral, with active choroidal neovascularization: Secondary | ICD-10-CM | POA: Diagnosis not present

## 2022-04-21 ENCOUNTER — Encounter: Payer: Self-pay | Admitting: Pulmonary Disease

## 2022-04-21 ENCOUNTER — Ambulatory Visit (INDEPENDENT_AMBULATORY_CARE_PROVIDER_SITE_OTHER): Payer: Medicare Other | Admitting: Pulmonary Disease

## 2022-04-21 VITALS — BP 132/64 | HR 62 | Temp 97.9°F | Ht 71.0 in | Wt 241.6 lb

## 2022-04-21 DIAGNOSIS — J449 Chronic obstructive pulmonary disease, unspecified: Secondary | ICD-10-CM | POA: Diagnosis not present

## 2022-04-21 NOTE — Patient Instructions (Signed)
Follow up in 1 year.

## 2022-04-21 NOTE — Progress Notes (Signed)
? ?[facility] ? ?Chief Complaint  ?Patient presents with  ? Follow-up  ?  Doing good, occass. Cough-clear  ? ? ?Constitutional:  ?BP 132/64 (BP Location: Left Arm, Cuff Size: Normal)   Pulse 62   Temp 97.9 ?F (36.6 ?C) (Temporal)   Ht 5\' 11"  (1.803 m)   Wt 241 lb 9.6 oz (109.6 kg)   SpO2 97%   BMI 33.70 kg/m?  ? ?Past Medical History:  ?HTN, HLD, Prostatitis, Macular degeneration, BPH, COVID 19 infection January 2022, Diverticulosis ? ?Past Surgical History:  ?He  has a past surgical history that includes left ring finger surgery (1970's); Tonsillectomy (as child); colonscopy (few yrs ago); sigmoidscopy; divertilosis; and Transurethral resection of prostate (N/A, 03/19/2018). ? ?Brief Summary:  ?Tim Thomas is a 71 y.o. male former smoker with COPD/asthma. ?  ? ? ? ?Subjective:  ? ?Had to stop pulmonary rehab because of ankle pain, knee pain, and GI symptoms.  Ankle and GI better.  Got knee injection recently and this has helped. ? ?Had dental extraction and will be getting implants. ? ?Has occasional cough in spurts depending on the weather and pollen.  More recently doing better.   ? ?Low dose CT chest from March was stable. ? ? ?Physical Exam:  ? ?Appearance - well kempt  ? ?ENMT - no sinus tenderness, no oral exudate, no LAN, Mallampati 3 airway, no stridor ? ?Respiratory - equal breath sounds bilaterally, no wheezing or rales ? ?CV - s1s2 regular rate and rhythm, no murmurs ? ?Ext - no clubbing, no edema ? ?Skin - no rashes ? ?Psych - normal mood and affect ? ? ? ?  ?Pulmonary testing:  ?PFT 05/12/12 >> FEV1 3.37(99%), FEV1% 68, TLC 7.33(103%), DLCO 116%, +BD ?Spirometry 08/20/15 >> FEV1 3.30 (105%), FEV1% 76 ?Spirometry 10/01/18 >> FEV1 3.2 (108%), FEV1% 73 ?PFT 11/02/19 >> FEV1 3.64 (119%), FEV1% 75, TLC 8.31 (115%), DLCO 120%, +BD from FEF 25-75% ?Allergy test 08/23/20 >> reaction to everything ? ?Chest Imaging:  ?Low dose CT chest 09/02/16 >> paraseptal  emphysema, bronchial wall thickening ?Low dose CT chest 11/23/19 >> emphysema, atherosclerosis ?Low dose CT chest 11/29/20 >> no change ?Low dose CT chest 02/26/22 >> atherosclerosis, small HH, mild bronchial thickening, mild centrilobular and paraseptal emphysema ? ?Cardiac Tests:  ?Echo 05/24/19 >> EF above 65%, impaired relaxation ? ?Social History:  ?He  reports that he quit smoking about 3 years ago. His smoking use included cigarettes. He has a 79.50 pack-year smoking history. He has never used smokeless tobacco. He reports current alcohol use. He reports that he does not use drugs. ? ?Family History:  ?His family history includes COPD in his mother; Lung cancer in his mother; Thyroid disease in his sister. ?  ? ? ?Assessment/Plan:  ? ?COPD with asthma and emphysema. ?- continue trelegy 100 one puff daily, and singulair 10 mg qhs ?- prn albuterol ?  ?Allergic rhinitis with conjunctivitis and food allergy. ?- previously followed by followed by Dr. 07/24/19 with Allergy and Asthma ?- continue OTC antihistamine and singulair ?- continue flonase ?  ?History of tobacco abuse. ?- f/u LDCT chest in March 2024 ?  ?Time Spent Involved in Patient Care on Day of Examination:  ?26 minutes ? ?Follow up:  ? ?Patient Instructions  ?Follow up in 1 year ? ?Medication List:  ? ?Allergies as of 04/21/2022   ? ?   Reactions  ? Bee Venom Swelling  ? Facial and sit of sting swelling ?  Has EPi-pen  ? Ciprofloxacin Other (See Comments)  ? Neck stiffness and pains  ? Ivp Dye [iodinated Contrast Media] Hives, Other (See Comments)  ? Shellfish Allergy   ? Per patient's allergist to avoid shellfish due to allergy to contrast dye.   ? Sulfa Antibiotics Anxiety  ? ?  ? ?  ?Medication List  ?  ? ?  ? Accurate as of Apr 21, 2022 10:19 AM. If you have any questions, ask your nurse or doctor.  ?  ?  ? ?  ? ?STOP taking these medications   ? ?diphenhydrAMINE 50 MG tablet ?Commonly known as: BENADRYL ?Stopped by: Coralyn Helling, MD ?  ? ?  ? ?TAKE these  medications   ? ?acetaminophen 500 MG tablet ?Commonly known as: TYLENOL ?Take 500 mg by mouth every 6 (six) hours as needed for moderate pain. ?  ?albuterol 108 (90 Base) MCG/ACT inhaler ?Commonly known as: VENTOLIN HFA ?TAKE 2 PUFFS BY MOUTH EVERY 6 HOURS AS NEEDED FOR WHEEZE OR SHORTNESS OF BREATH ?  ?amLODipine 10 MG tablet ?Commonly known as: NORVASC ?Take 10 mg by mouth daily. ?  ?cetirizine 10 MG tablet ?Commonly known as: ZYRTEC ?Take 10 mg by mouth daily. As needed ?  ?EPINEPHrine 0.3 mg/0.3 mL Soaj injection ?Commonly known as: EPI-PEN ?Inject 0.3 mLs (0.3 mg total) into the muscle once. ?What changed:  ?when to take this ?reasons to take this ?  ?ipratropium 0.06 % nasal spray ?Commonly known as: ATROVENT ?Place 2 sprays into both nostrils 3 (three) times daily as needed (nasal drainage). ?  ?linaclotide 145 MCG Caps capsule ?Commonly known as: LINZESS ?Take 145 mcg by mouth daily as needed. ?  ?losartan 100 MG tablet ?Commonly known as: COZAAR ?Take 100 mg by mouth daily. ?  ?minocycline 100 MG tablet ?Commonly known as: DYNACIN ?Take 100 mg by mouth 2 (two) times daily. ?  ?montelukast 10 MG tablet ?Commonly known as: SINGULAIR ?TAKE 1 TABLET BY MOUTH EVERYDAY AT BEDTIME ?  ?Nevanac 0.1 % ophthalmic suspension ?Generic drug: nepafenac ?SMARTSIG:In Eye(s) ?  ?rosuvastatin 20 MG tablet ?Commonly known as: CRESTOR ?Take 20 mg by mouth daily. ?  ?Trelegy Ellipta 100-62.5-25 MCG/ACT Aepb ?Generic drug: Fluticasone-Umeclidin-Vilant ?INHALE 1 PUFF BY MOUTH EVERY DAY ?  ? ?  ? ? ?Signature:  ?Coralyn Helling, MD ?[place] [facility] ?Pager - [phone] - [phone] ?04/21/2022, 10:19 AM ?  ? ? ? ? ? ? ? ? ?

## 2022-05-27 ENCOUNTER — Other Ambulatory Visit: Payer: Self-pay | Admitting: Pulmonary Disease

## 2022-07-01 ENCOUNTER — Encounter (INDEPENDENT_AMBULATORY_CARE_PROVIDER_SITE_OTHER): Payer: Medicare Other | Admitting: Ophthalmology

## 2022-07-01 DIAGNOSIS — H353231 Exudative age-related macular degeneration, bilateral, with active choroidal neovascularization: Secondary | ICD-10-CM | POA: Diagnosis not present

## 2022-07-01 DIAGNOSIS — H35033 Hypertensive retinopathy, bilateral: Secondary | ICD-10-CM | POA: Diagnosis not present

## 2022-07-01 DIAGNOSIS — H43813 Vitreous degeneration, bilateral: Secondary | ICD-10-CM

## 2022-07-01 DIAGNOSIS — I10 Essential (primary) hypertension: Secondary | ICD-10-CM | POA: Diagnosis not present

## 2022-07-03 ENCOUNTER — Other Ambulatory Visit: Payer: Self-pay | Admitting: Pulmonary Disease

## 2022-10-14 ENCOUNTER — Encounter (INDEPENDENT_AMBULATORY_CARE_PROVIDER_SITE_OTHER): Payer: Medicare Other | Admitting: Ophthalmology

## 2022-10-14 DIAGNOSIS — I10 Essential (primary) hypertension: Secondary | ICD-10-CM

## 2022-10-14 DIAGNOSIS — H35033 Hypertensive retinopathy, bilateral: Secondary | ICD-10-CM | POA: Diagnosis not present

## 2022-10-14 DIAGNOSIS — H353231 Exudative age-related macular degeneration, bilateral, with active choroidal neovascularization: Secondary | ICD-10-CM | POA: Diagnosis not present

## 2022-10-14 DIAGNOSIS — H43813 Vitreous degeneration, bilateral: Secondary | ICD-10-CM | POA: Diagnosis not present

## 2022-11-29 ENCOUNTER — Other Ambulatory Visit: Payer: Self-pay | Admitting: Pulmonary Disease

## 2022-12-01 ENCOUNTER — Encounter (HOSPITAL_BASED_OUTPATIENT_CLINIC_OR_DEPARTMENT_OTHER): Payer: Self-pay | Admitting: Pulmonary Disease

## 2022-12-03 IMAGING — DX DG CHEST 2V
2 series · 2 of 2 positions shown · non-contrast
Comparison: 05/20/2019 and CT chest 11/29/2020.

CLINICAL DATA: Post COVID, asthma.

EXAM:
CHEST - 2 VIEW

[chest pa]
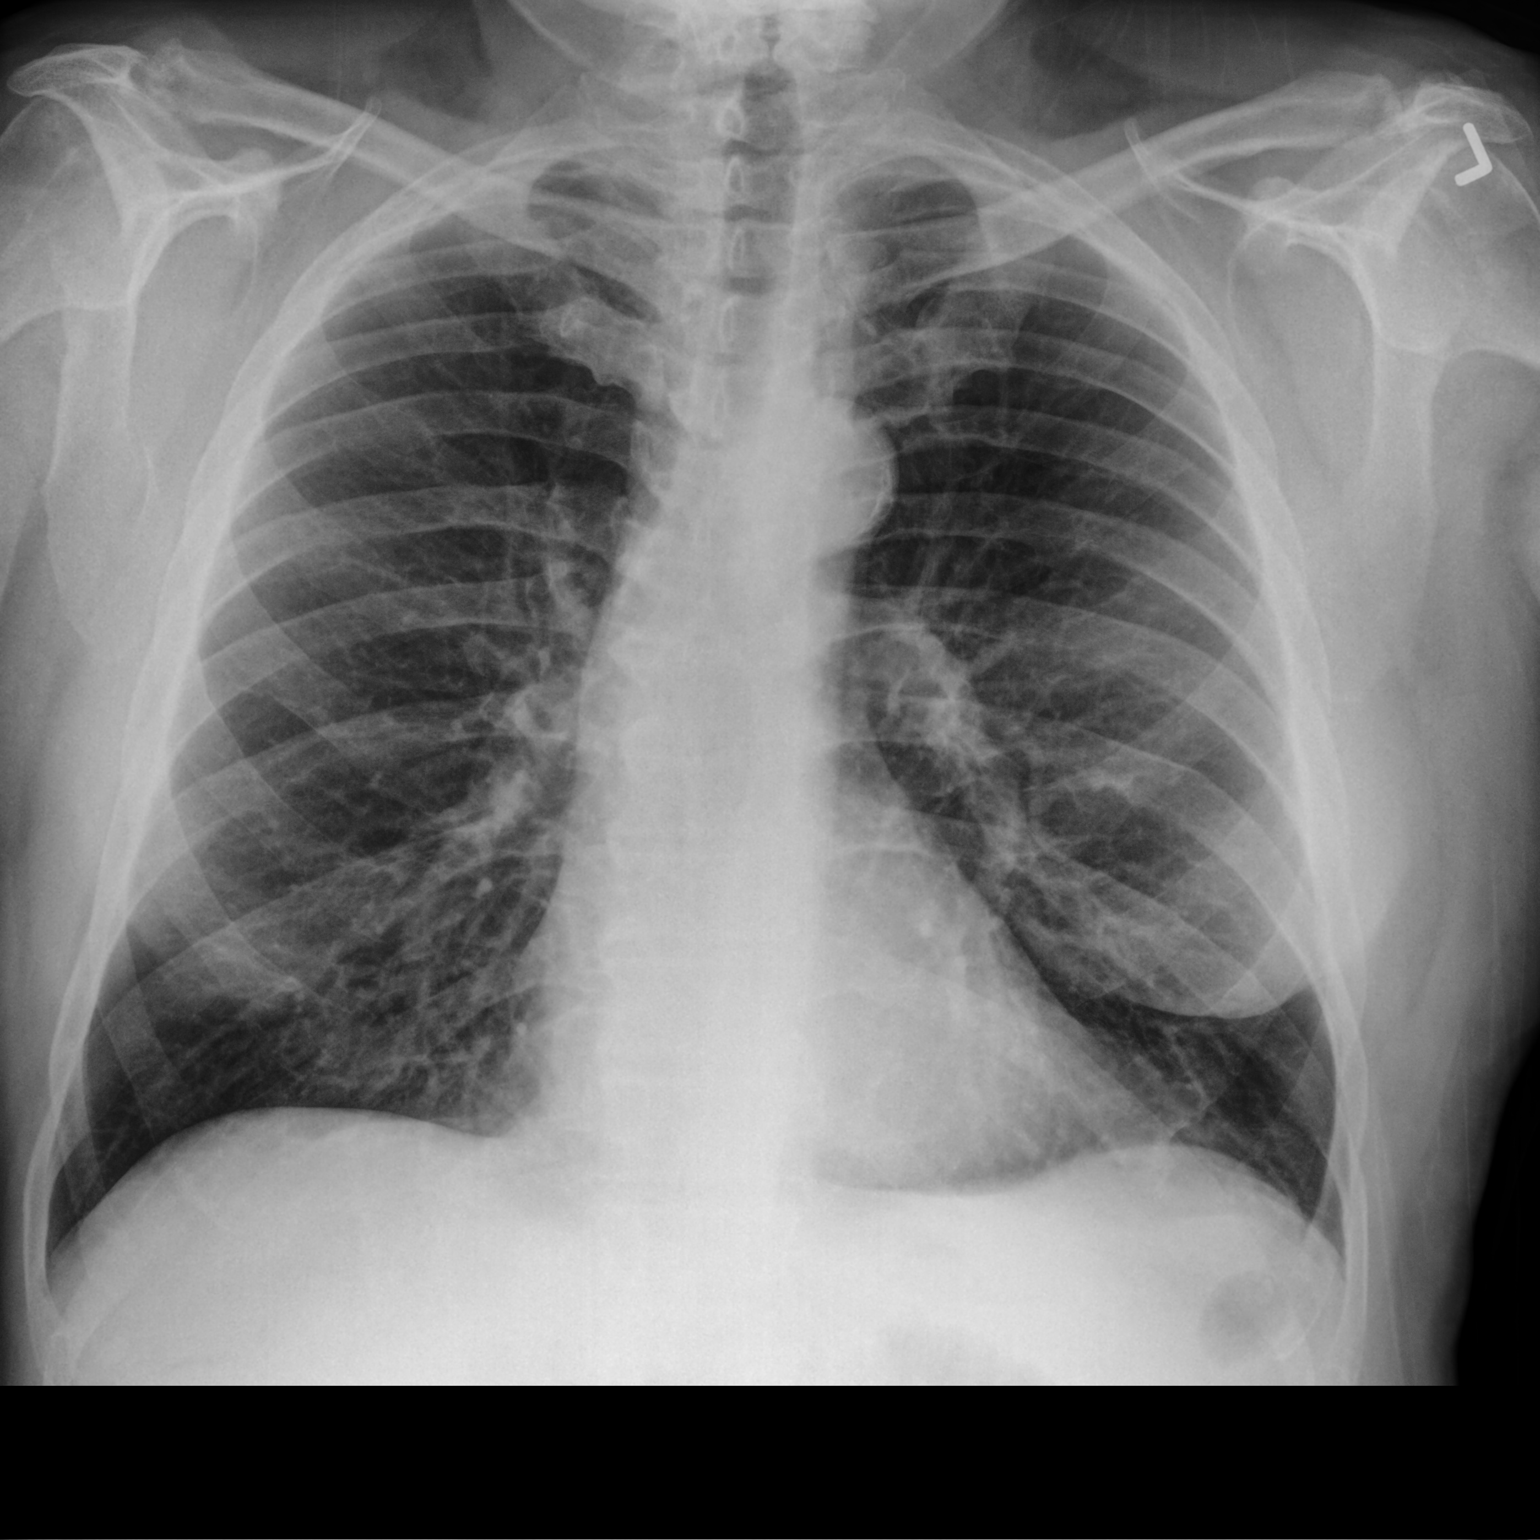

[chest lat]
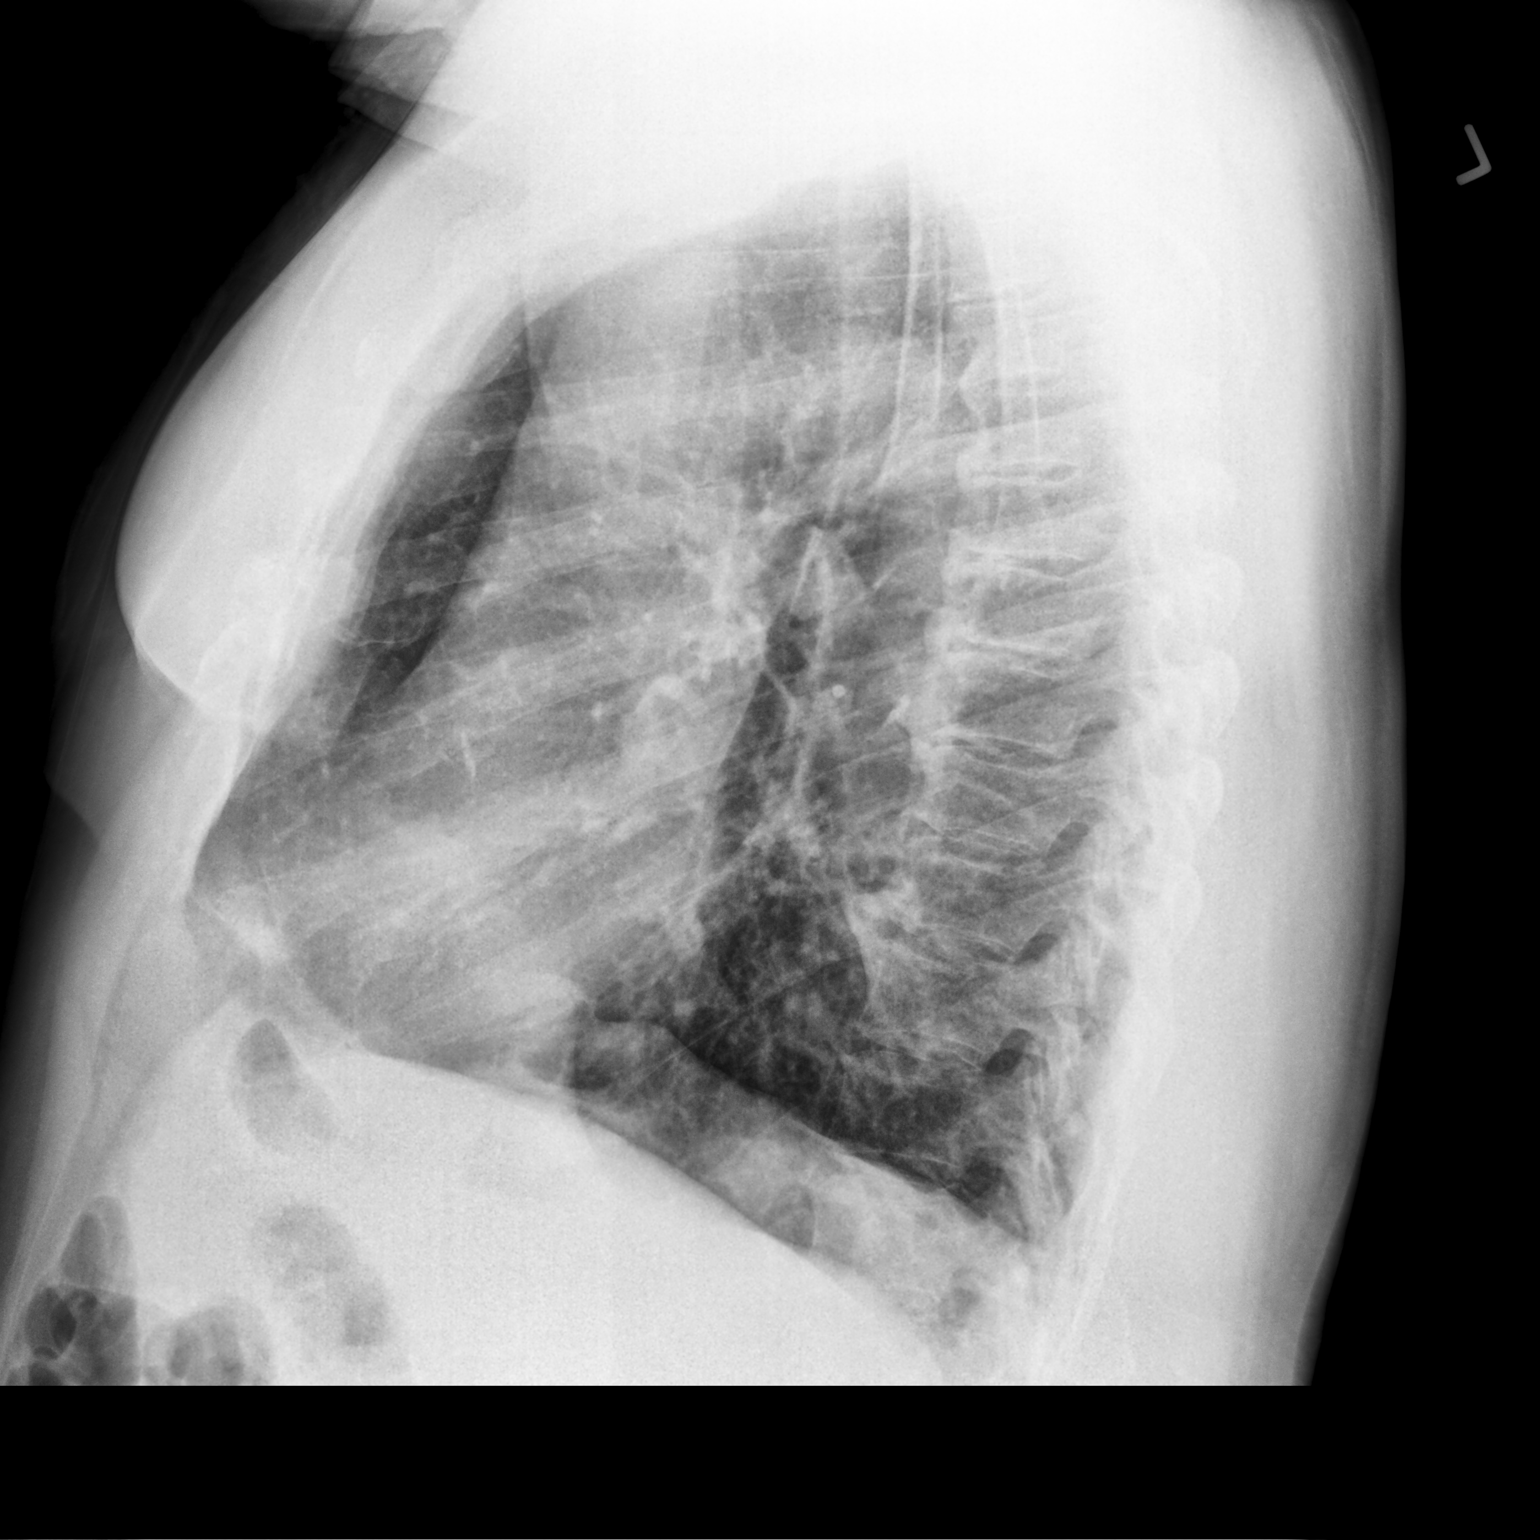

[2 of 2 positions shown; findings below may reference images not displayed]

FINDINGS: Trachea is midline. Heart size normal. Lungs may be minimally
hyperinflated but are clear. No pleural fluid. Degenerative changes
in the spine.
IMPRESSION: No acute findings.

## 2022-12-05 ENCOUNTER — Encounter: Payer: Self-pay | Admitting: Adult Health

## 2022-12-05 ENCOUNTER — Ambulatory Visit (INDEPENDENT_AMBULATORY_CARE_PROVIDER_SITE_OTHER): Payer: Medicare Other

## 2022-12-05 ENCOUNTER — Ambulatory Visit (INDEPENDENT_AMBULATORY_CARE_PROVIDER_SITE_OTHER): Payer: Medicare Other | Admitting: Adult Health

## 2022-12-05 VITALS — BP 116/68 | HR 77 | Ht 71.5 in | Wt 242.4 lb

## 2022-12-05 DIAGNOSIS — J441 Chronic obstructive pulmonary disease with (acute) exacerbation: Secondary | ICD-10-CM | POA: Diagnosis not present

## 2022-12-05 DIAGNOSIS — J4489 Other specified chronic obstructive pulmonary disease: Secondary | ICD-10-CM

## 2022-12-05 MED ORDER — PREDNISONE 10 MG PO TABS: ORAL_TABLET | ORAL | 0 refills | Status: DC

## 2022-12-05 MED ORDER — BENZONATATE 200 MG PO CAPS: 200.0000 mg | ORAL_CAPSULE | Freq: Three times a day (TID) | ORAL | 1 refills | Status: DC | PRN

## 2022-12-05 NOTE — Patient Instructions (Signed)
Prednisone taper over next week. Take with food  Delsym 2 tsp Twice daily  As needed  cough  Tessalon Three times a day  As needed  cough  Sips of water to soothe throat and prevent throat clearing and coughing  Zyrtec 10mg  At bedtime  for 1 week and then as needed daily   Saline nasal spray As needed   Chest xray today .  Follow up with Dr.  as planned and As needed   Please contact office for sooner follow up if symptoms do not improve or worsen or seek emergency care

## 2022-12-05 NOTE — Assessment & Plan Note (Signed)
Exacerbation , post viral cough   Check chest xray today. Treat with cough control regimen . Steroid burst   Plan  Patient Instructions  Prednisone taper over next week. Take with food  Delsym 2 tsp Twice daily  As needed  cough  Tessalon Three times a day  As needed  cough  Sips of water to soothe throat and prevent throat clearing and coughing  Zyrtec 10mg  At bedtime  for 1 week and then as needed daily   Saline nasal spray As needed   Chest xray today .  Follow up with Dr.  as planned and As needed   Please contact office for sooner follow up if symptoms do not improve or worsen or seek emergency care

## 2022-12-05 NOTE — Progress Notes (Signed)
@Patient  ID: , male    DOB: 07-May-1951, 71 y.o.   MRN: [id number]  Chief Complaint  Patient presents with   Acute Visit    Referring provider: [id number], MD  HPI: 71 year old male former smoker followed for COPD with asthma  TEST/EVENTS :  PFT 05/12/12 >> FEV1 3.37(99%), FEV1% 68, TLC 7.33(103%), DLCO 116%, +BD Spirometry 08/20/15 >> FEV1 3.30 (105%), FEV1% 76 Spirometry 10/01/18 >> FEV1 3.2 (108%), FEV1% 73 PFT 11/02/19 >> FEV1 3.64 (119%), FEV1% 75, TLC 8.31 (115%), DLCO 120%, +BD from FEF 25-75% Allergy test 08/23/20 >> reaction to everything   Chest Imaging:  Low dose CT chest 09/02/16 >> paraseptal emphysema, bronchial wall thickening Low dose CT chest 11/23/19 >> emphysema, atherosclerosis Low dose CT chest 11/29/20 >> no change Low dose CT chest 02/26/22 >> atherosclerosis, small HH, mild bronchial thickening, mild centrilobular and paraseptal emphysema   Cardiac Tests:  Echo 05/24/19 >> EF above 65%, impaired relaxation    12/05/2022 Acute OV : Cough , COPD  Patient presents for an acute office visit.  Complains over the last 2 months he has had an ongoing cough that will not go away.  Patient has underlying COPD with asthma and emphysema.  He remains on Trelegy 1 puff daily and Singulair at bedtime. Complains of 2 months with cough, congestion and drainage . Treated for COPD flare with bronchitis with Zpack and then Amoxicillin. Symptoms have been slowly getting better. Congestion is decreased but has ongoing dry cough . Some mild wheezing .  Tested for Covid x 3 were negative. Has aching in chest at night when he lies on left side.  Has fatigue. Increased albuterol inhaler    Allergies  Allergen Reactions   Bee Venom Swelling    Facial and sit of sting swelling Has EPi-pen   Ciprofloxacin Other (See Comments)    Neck stiffness and pains   Ivp Dye [Iodinated Contrast Media] Hives and Other (See Comments)   Shellfish Allergy     Per patient's  allergist to avoid shellfish due to allergy to contrast dye.    Sulfa Antibiotics Anxiety    Immunization History  Administered Date(s) Administered   Fluad Quad(high Dose 65+) 08/16/2020   Influenza Split 09/22/2011, 09/24/2015, 09/30/2017   Influenza Whole 10/22/2012   Influenza, High Dose Seasonal PF 10/01/2018   Influenza,inj,Quad PF,6+ Mos 09/21/2014   Influenza-Unspecified 08/31/2019   PFIZER(Purple Top)SARS-COV-2 Vaccination 01/14/2020, 02/04/2020, 10/06/2020   Pneumococcal Polysaccharide-23 12/23/2007    Past Medical History:  Diagnosis Date   Anxiety    BPH (benign prostatic hyperplasia)    COPD (chronic obstructive pulmonary disease) (HCC)    COPD with asthma 04/28/2012   Foley catheter in place    02-17-18, occ clots in bag   High cholesterol    Hypertension    Prostate enlargement    Shoulder pain    right chronic   Urinary retention     august  2018   Wet senile macular degeneration (HCC)     right eye current tx, left eye tx in past    Tobacco History: Social History   Tobacco Use  Smoking Status Former   Packs/day: 1.50   Years: 53.00   Total pack years: 79.50   Types: Cigarettes   Quit date: 03/04/2019   Years since quitting: 3.7  Smokeless Tobacco Never   Counseling given: Not Answered   Outpatient Medications Prior to Visit  Medication Sig Dispense Refill   acetaminophen (TYLENOL) 500 MG tablet Take 500  mg by mouth every 6 (six) hours as needed for moderate pain.     albuterol (VENTOLIN HFA) 108 (90 Base) MCG/ACT inhaler TAKE 2 PUFFS BY MOUTH EVERY 6 HOURS AS NEEDED FOR WHEEZE OR SHORTNESS OF BREATH 8.5 each 6   amLODipine (NORVASC) 10 MG tablet Take 10 mg by mouth daily.      cetirizine (ZYRTEC) 10 MG tablet Take 10 mg by mouth daily. As needed     EPINEPHrine 0.3 mg/0.3 mL IJ SOAJ injection Inject 0.3 mLs (0.3 mg total) into the muscle once. (Patient taking differently: Inject 0.3 mg into the muscle as needed for anaphylaxis.) 2 Device 1    linaclotide (LINZESS) 145 MCG CAPS capsule Take 145 mcg by mouth daily as needed.     losartan (COZAAR) 100 MG tablet Take 100 mg by mouth daily.     minocycline (DYNACIN) 100 MG tablet Take 100 mg by mouth 2 (two) times daily.     montelukast (SINGULAIR) 10 MG tablet TAKE 1 TABLET BY MOUTH EVERYDAY AT BEDTIME 90 tablet 3   NEVANAC 0.1 % ophthalmic suspension SMARTSIG:In Eye(s)     rosuvastatin (CRESTOR) 20 MG tablet Take 20 mg by mouth daily.      TRELEGY ELLIPTA 100-62.5-25 MCG/ACT AEPB INHALE 1 PUFF BY MOUTH EVERY DAY 60 each 5   ipratropium (ATROVENT) 0.06 % nasal spray Place 2 sprays into both nostrils 3 (three) times daily as needed (nasal drainage). 15 mL 5   No facility-administered medications prior to visit.     Review of Systems:   Constitutional:   No  weight loss, night sweats,  Fevers, chills,  +fatigue, or  lassitude.  HEENT:   No headaches,  Difficulty swallowing,  Tooth/dental problems, or  Sore throat,                No sneezing, itching, ear ache,  +nasal congestion, post nasal drip,   CV:  No chest pain,  Orthopnea, PND, swelling in lower extremities, anasarca, dizziness, palpitations, syncope.   GI  No heartburn, indigestion, abdominal pain, nausea, vomiting, diarrhea, change in bowel habits, loss of appetite, bloody stools.   Resp: .  No chest wall deformity  Skin: no rash or lesions.  GU: no dysuria, change in color of urine, no urgency or frequency.  No flank pain, no hematuria   MS:  No joint pain or swelling.  No decreased range of motion.  No back pain.    Physical Exam  BP 116/68   Pulse 77   Ht 5' 11.5" (1.816 m)   Wt 242 lb 6.4 oz (110 kg)   SpO2 98% Comment: on RA  BMI 33.34 kg/m   GEN: A/Ox3; pleasant , NAD, well nourished    HEENT:  [place]/AT,  , NOSE-clear, THROAT-clear, no lesions, no postnasal drip or exudate noted.   NECK:  Supple w/ fair ROM; no JVD; normal carotid impulses w/o bruits; no thyromegaly or nodules palpated; no  lymphadenopathy.    RESP  Clear  P & A; w/o, wheezes/ rales/ or rhonchi. no accessory muscle use, no dullness to percussion  CARD:  RRR, no m/r/g, no peripheral edema, pulses intact, no cyanosis or clubbing.  GI:   Soft & nt; nml bowel sounds; no organomegaly or masses detected.   Musco: Warm bil, no deformities or joint swelling noted.   Neuro: alert, no focal deficits noted.    Skin: Warm, no lesions or rashes    Lab Results:  CBC    Component Value  Date/Time   WBC 8.8 01/25/2021 1047   RBC 5.01 01/25/2021 1047   HGB 14.9 01/25/2021 1047   HCT 44.8 01/25/2021 1047   PLT 227.0 01/25/2021 1047   MCV 89.4 01/25/2021 1047   MCH 29.7 03/21/2019 0229   MCHC 33.4 01/25/2021 1047   RDW 14.5 01/25/2021 1047   LYMPHSABS 1.8 01/25/2021 1047   MONOABS 0.9 01/25/2021 1047   EOSABS 0.1 01/25/2021 1047   BASOSABS 0.1 01/25/2021 1047    BMET    Component Value Date/Time   NA 138 01/25/2021 1047   K 4.2 01/25/2021 1047   CL 101 01/25/2021 1047   CO2 30 01/25/2021 1047   GLUCOSE 95 01/25/2021 1047   BUN 17 01/25/2021 1047   CREATININE 1.15 01/25/2021 1047   CALCIUM 9.8 01/25/2021 1047   GFRNONAA >60 03/21/2019 0229   GFRAA >60 03/21/2019 0229    BNP    Component Value Date/Time   BNP 17.2 03/21/2019 0229    ProBNP    Component Value Date/Time   PROBNP 15.0 05/20/2019 1137    Imaging: DG Chest 2 View  Result Date: 12/05/2022 CLINICAL DATA:  COPD flare.  Cough for 2 months. EXAM: CHEST - 2 VIEW COMPARISON:  Chest two views 01/25/2021, CT chest 04/27/2022 FINDINGS: Cardiac silhouette and mediastinal contours within normal limits. Mild-to-moderate calcification is again seen within aortic arch. Mild flattening of the diaphragms and hyperinflation, similar to prior. The lungs are clear. No pleural effusion or pneumothorax. Moderate multilevel degenerative disc space narrowing and anterior bridging osteophytes of the thoracic spine IMPRESSION: 1. No active cardiopulmonary  disease. 2. Mild chronic hyperinflation. Electronically Signed   By: Neita Garnet M.D.   On: 12/05/2022 11:08         Latest Ref Rng & Units 11/02/2019   12:52 PM  PFT Results  FVC-Pre L 4.84   FVC-Predicted Pre % 120   FVC-Post L 4.88   FVC-Predicted Post % 121   Pre FEV1/FVC % % 69   Post FEV1/FCV % % 75   FEV1-Pre L 3.33   FEV1-Predicted Pre % 109   FEV1-Post L 3.64   DLCO uncorrected ml/min/mmHg 32.33   DLCO UNC% % 120   DLVA Predicted % 101   TLC L 8.31   TLC % Predicted % 115   RV % Predicted % 128     No results found for: "NITRICOXIDE"      Assessment & Plan:   COPD with asthma Exacerbation , post viral cough   Check chest xray today. Treat with cough control regimen . Steroid burst   Plan  Patient Instructions  Prednisone taper over next week. Take with food  Delsym 2 tsp Twice daily  As needed  cough  Tessalon Three times a day  As needed  cough  Sips of water to soothe throat and prevent throat clearing and coughing  Zyrtec 10mg  At bedtime  for 1 week and then as needed daily   Saline nasal spray As needed   Chest xray today .  Follow up with Dr.  as planned and As needed   Please contact office for sooner follow up if symptoms do not improve or worsen or seek emergency care         Craige Cotta, NP 12/05/2022

## 2022-12-17 ENCOUNTER — Telehealth: Payer: Self-pay | Admitting: Adult Health

## 2022-12-17 NOTE — Telephone Encounter (Signed)
Patient called to ask the nurse if he should wait to get his covid booster shot if he has been taking Prednisone.  He stated he last took Prednisone on 12/21.  Please call patient to discuss so he can get the covid booster.  CB# [phone]

## 2022-12-17 NOTE — Telephone Encounter (Signed)
Called and left voicemail for patient that he should wait possibly up to 2 weeks after prednisone and antibiotics for covid booster. I advised him that if he had any questions to call office back. Nothing further needed

## 2023-01-20 ENCOUNTER — Other Ambulatory Visit: Payer: Self-pay | Admitting: Pulmonary Disease

## 2023-01-24 ENCOUNTER — Other Ambulatory Visit: Payer: Self-pay | Admitting: Internal Medicine

## 2023-01-24 MED ORDER — NIRMATRELVIR/RITONAVIR (PAXLOVID)TABLET: 3.0000 | ORAL_TABLET | Freq: Two times a day (BID) | ORAL | 0 refills | Status: AC

## 2023-01-27 ENCOUNTER — Encounter (INDEPENDENT_AMBULATORY_CARE_PROVIDER_SITE_OTHER): Payer: Self-pay

## 2023-01-27 ENCOUNTER — Encounter (INDEPENDENT_AMBULATORY_CARE_PROVIDER_SITE_OTHER): Payer: Medicare Other | Admitting: Ophthalmology

## 2023-02-17 ENCOUNTER — Encounter (INDEPENDENT_AMBULATORY_CARE_PROVIDER_SITE_OTHER): Payer: Medicare Other | Admitting: Ophthalmology

## 2023-02-17 DIAGNOSIS — H353231 Exudative age-related macular degeneration, bilateral, with active choroidal neovascularization: Secondary | ICD-10-CM

## 2023-02-17 DIAGNOSIS — H43813 Vitreous degeneration, bilateral: Secondary | ICD-10-CM

## 2023-02-17 DIAGNOSIS — I10 Essential (primary) hypertension: Secondary | ICD-10-CM | POA: Diagnosis not present

## 2023-02-17 DIAGNOSIS — H35033 Hypertensive retinopathy, bilateral: Secondary | ICD-10-CM

## 2023-02-26 ENCOUNTER — Ambulatory Visit
Admission: RE | Admit: 2023-02-26 | Discharge: 2023-02-26 | Disposition: A | Discharge status: Home / Self Care | Payer: Medicare Other | Source: Ambulatory Visit | Attending: Acute Care | Admitting: Acute Care

## 2023-02-26 DIAGNOSIS — Z87891 Personal history of nicotine dependence: Secondary | ICD-10-CM

## 2023-03-02 ENCOUNTER — Other Ambulatory Visit: Payer: Self-pay | Admitting: Acute Care

## 2023-03-02 DIAGNOSIS — Z87891 Personal history of nicotine dependence: Secondary | ICD-10-CM

## 2023-03-02 DIAGNOSIS — Z122 Encounter for screening for malignant neoplasm of respiratory organs: Secondary | ICD-10-CM

## 2023-03-09 ENCOUNTER — Telehealth (HOSPITAL_BASED_OUTPATIENT_CLINIC_OR_DEPARTMENT_OTHER): Payer: Self-pay | Admitting: Pulmonary Disease

## 2023-03-09 NOTE — Telephone Encounter (Signed)
Dr. Halford Chessman is this alright?

## 2023-03-09 NOTE — Telephone Encounter (Signed)
Okay to switch to a provider at [facility].

## 2023-03-09 NOTE — Telephone Encounter (Signed)
Spoke with patient. I have him scheduled with Dr. Erin Fulling. Patient is aware of location change. Nothing further needed.

## 2023-03-09 NOTE — Telephone Encounter (Signed)
Called pt to set up recall appt and pt was made aware of the location change. Pt states it would be much easier for him to go to [place]. location instead. Please advise.

## 2023-03-25 ENCOUNTER — Ambulatory Visit (INDEPENDENT_AMBULATORY_CARE_PROVIDER_SITE_OTHER): Payer: Medicare Other | Admitting: Pulmonary Disease

## 2023-03-25 ENCOUNTER — Encounter: Payer: Self-pay | Admitting: Pulmonary Disease

## 2023-03-25 VITALS — BP 118/72 | HR 66 | Ht 71.5 in | Wt 240.0 lb

## 2023-03-25 DIAGNOSIS — J4489 Other specified chronic obstructive pulmonary disease: Secondary | ICD-10-CM | POA: Diagnosis not present

## 2023-03-25 NOTE — Progress Notes (Signed)
Synopsis: Former smoker with COPD/Asthma, previously followed by Dr. Halford Chessman.   Subjective:   PATIENT ID: Tim Thomas GENDER: male DOB: 01-04-1951, MRN: [id number]  HPI  Chief Complaint  Patient presents with   Establish Care    Former [facility] patient for COPD.    Tim Thomas is a 72 year old male, former smoker who returns to pulmonary clinic for COPD/asthma.   He has been on trelegy ellipta 100, 1 puff daily and as needed albuterol along montelukast 10mg  daily.   He had URI in early March and has had increased cough with mucous production. He is using albuterol 1-2 times per day. No night time awakenings due to cough or wheezing. He is using zyrtec as needed for spring allergies.  He is a retired Editor, commissioning, retired in 2003. Had home sleep study done recently and is awaiting results.   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   Foley catheter in place    02-17-18, occ clots in bag   High cholesterol    Hypertension    Prostate enlargement    Shoulder pain    right chronic   Urinary retention     august  2018   Wet senile macular degeneration     right eye current tx, left eye tx in past     Family History  Problem Relation Age of Onset   Lung cancer Mother    COPD Mother    Thyroid disease Sister      Social History   Socioeconomic History   Marital status: Married    Spouse name: Not on file   Number of children: Not on file   Years of education: Not on file   Highest education level: Not on file  Occupational History   Occupation: retired    Fish farm manager: UNEMPLOYED  Tobacco Use   Smoking status: Former    Packs/day: 1.50    Years: 53.00    Additional pack years: 0.00    Total pack years: 79.50    Types: Cigarettes    Quit date: 03/04/2019    Years since quitting: 4.0   Smokeless tobacco: Never  Vaping Use   Vaping Use: Never  used  Substance and Sexual Activity   Alcohol use: Yes    Alcohol/week: 0.0 standard drinks of alcohol    Comment: none since Dec 23, 2017   Drug use: No   Sexual activity: Not on file  Other Topics Concern   Not on file  Social History Narrative   Right handed.  Caffeine 5 cups avg daily.  Married, 4 kids.  Retired.  AD police science.   Social Determinants of Health   Financial Resource Strain: Not on file  Food Insecurity: Not on file  Transportation Needs: Not on file  Physical Activity: Not on file  Stress: Not on file  Social Connections: Not on file  Intimate Partner Violence: Not on file     Allergies  Allergen Reactions   Bee Venom Swelling    Facial and sit of sting swelling Has EPi-pen   Ciprofloxacin Other (See Comments)    Neck stiffness and pains   Ivp Dye [Iodinated Contrast Media] Hives and Other (See Comments)   Shellfish Allergy     Per patient's allergist to avoid shellfish due to allergy to contrast dye.    Sulfa Antibiotics Anxiety  Outpatient Medications Prior to Visit  Medication Sig Dispense Refill   acetaminophen (TYLENOL) 500 MG tablet Take 500 mg by mouth every 6 (six) hours as needed for moderate pain.     albuterol (VENTOLIN HFA) 108 (90 Base) MCG/ACT inhaler TAKE 2 PUFFS BY MOUTH EVERY 6 HOURS AS NEEDED FOR WHEEZE OR SHORTNESS OF BREATH 8.5 each 6   amLODipine (NORVASC) 10 MG tablet Take 10 mg by mouth daily.      cetirizine (ZYRTEC) 10 MG tablet Take 10 mg by mouth daily. As needed     EPINEPHrine 0.3 mg/0.3 mL IJ SOAJ injection Inject 0.3 mLs (0.3 mg total) into the muscle once. (Patient taking differently: Inject 0.3 mg into the muscle as needed for anaphylaxis.) 2 Device 1   linaclotide (LINZESS) 145 MCG CAPS capsule Take 145 mcg by mouth daily as needed.     losartan (COZAAR) 100 MG tablet Take 100 mg by mouth daily.     minocycline (DYNACIN) 100 MG tablet Take 100 mg by mouth 2 (two) times daily.     montelukast (SINGULAIR) 10 MG  tablet TAKE 1 TABLET BY MOUTH EVERYDAY AT BEDTIME 90 tablet 3   NEVANAC 0.1 % ophthalmic suspension SMARTSIG:In Eye(s)     rosuvastatin (CRESTOR) 20 MG tablet Take 20 mg by mouth daily.      TRELEGY ELLIPTA 100-62.5-25 MCG/ACT AEPB INHALE 1 PUFF BY MOUTH EVERY DAY 60 each 5   benzonatate (TESSALON) 200 MG capsule Take 1 capsule (200 mg total) by mouth 3 (three) times daily as needed. 45 capsule 1   predniSONE (DELTASONE) 10 MG tablet 4 tabs for 2 days, then 3 tabs for 2 days, 2 tabs for 2 days, then 1 tab for 2 days, then stop 20 tablet 0   No facility-administered medications prior to visit.    Review of Systems  Constitutional:  Negative for chills, fever, malaise/fatigue and weight loss.  HENT:  Negative for congestion, sinus pain and sore throat.   Eyes: Negative.   Respiratory:  Positive for cough, sputum production and shortness of breath. Negative for hemoptysis and wheezing.   Cardiovascular:  Negative for chest pain, palpitations, orthopnea, claudication and leg swelling.  Gastrointestinal:  Negative for abdominal pain, heartburn, nausea and vomiting.  Genitourinary: Negative.   Musculoskeletal:  Negative for joint pain and myalgias.  Skin:  Negative for rash.  Neurological:  Negative for weakness.  Endo/Heme/Allergies: Negative.   Psychiatric/Behavioral: Negative.     Objective:   Vitals:   03/25/23 1404  BP: 118/72  Pulse: 66  SpO2: 99%  Weight: 240 lb (108.9 kg)  Height: 5' 11.5" (1.816 m)     Physical Exam Constitutional:      General: He is not in acute distress.    Appearance: He is obese.  HENT:     Head: Normocephalic and atraumatic.  Eyes:     Conjunctiva/sclera: Conjunctivae normal.  Cardiovascular:     Rate and Rhythm: Normal rate and regular rhythm.     Pulses: Normal pulses.     Heart sounds: Normal heart sounds. No murmur heard. Pulmonary:     Effort: Pulmonary effort is normal.     Breath sounds: Normal breath sounds. No wheezing, rhonchi or  rales.  Musculoskeletal:     Right lower leg: No edema.     Left lower leg: No edema.  Skin:    General: Skin is warm and dry.  Neurological:     General: No focal deficit present.     Mental  Status: He is alert.  Psychiatric:        Mood and Affect: Mood normal.        Behavior: Behavior normal.        Thought Content: Thought content normal.        Judgment: Judgment normal.    CBC    Component Value Date/Time   WBC 8.8 01/25/2021 1047   RBC 5.01 01/25/2021 1047   HGB 14.9 01/25/2021 1047   HCT 44.8 01/25/2021 1047   PLT 227.0 01/25/2021 1047   MCV 89.4 01/25/2021 1047   MCH 29.7 03/21/2019 0229   MCHC 33.4 01/25/2021 1047   RDW 14.5 01/25/2021 1047   LYMPHSABS 1.8 01/25/2021 1047   MONOABS 0.9 01/25/2021 1047   EOSABS 0.1 01/25/2021 1047   BASOSABS 0.1 01/25/2021 1047     Chest imaging: CT Chest 02/26/23 1. Lung-RADS 1, negative. Continue annual screening with low-dose chest CT without contrast in 12 months. 2. Coronary artery calcifications, aortic Atherosclerosis (ICD10-I70.0) and Emphysema (ICD10-J43.9).  PFT:    Latest Ref Rng & Units 11/02/2019   12:52 PM  PFT Results  FVC-Pre L 4.84   FVC-Predicted Pre % 120   FVC-Post L 4.88   FVC-Predicted Post % 121   Pre FEV1/FVC % % 69   Post FEV1/FCV % % 75   FEV1-Pre L 3.33   FEV1-Predicted Pre % 109   FEV1-Post L 3.64   DLCO uncorrected ml/min/mmHg 32.33   DLCO UNC% % 120   DLVA Predicted % 101   TLC L 8.31   TLC % Predicted % 115   RV % Predicted % 128   PFTs 2020 Mild obstruction with air trapping  Labs:  Path:  Echo:  Heart Catheterization:  Assessment & Plan:   COPD with asthma  Discussion: Tim Thomas is a 72 year old male, former smoker who returns to pulmonary clinic for COPD/asthma.   He is to continue trelegy ellipta 166mcg, 1 puff daily and as needed albuterol.   He is to continue montelukast 10mg  daily and as needed zyrtec.   If he continues to have issues with cough and  chest congestion, recommend starting nebulizer treatments with flutter valve therapy.  Follow up in 1 year. Call sooner if needed.  Freda Jackson, MD [facility] Office: [phone]   Current Outpatient Medications:    acetaminophen (TYLENOL) 500 MG tablet, Take 500 mg by mouth every 6 (six) hours as needed for moderate pain., Disp: , Rfl:    albuterol (VENTOLIN HFA) 108 (90 Base) MCG/ACT inhaler, TAKE 2 PUFFS BY MOUTH EVERY 6 HOURS AS NEEDED FOR WHEEZE OR SHORTNESS OF BREATH, Disp: 8.5 each, Rfl: 6   amLODipine (NORVASC) 10 MG tablet, Take 10 mg by mouth daily. , Disp: , Rfl:    cetirizine (ZYRTEC) 10 MG tablet, Take 10 mg by mouth daily. As needed, Disp: , Rfl:    EPINEPHrine 0.3 mg/0.3 mL IJ SOAJ injection, Inject 0.3 mLs (0.3 mg total) into the muscle once. (Patient taking differently: Inject 0.3 mg into the muscle as needed for anaphylaxis.), Disp: 2 Device, Rfl: 1   linaclotide (LINZESS) 145 MCG CAPS capsule, Take 145 mcg by mouth daily as needed., Disp: , Rfl:    losartan (COZAAR) 100 MG tablet, Take 100 mg by mouth daily., Disp: , Rfl:    minocycline (DYNACIN) 100 MG tablet, Take 100 mg by mouth 2 (two) times daily., Disp: , Rfl:    montelukast (SINGULAIR) 10 MG tablet, TAKE 1 TABLET  BY MOUTH EVERYDAY AT BEDTIME, Disp: 90 tablet, Rfl: 3   NEVANAC 0.1 % ophthalmic suspension, SMARTSIG:In Eye(s), Disp: , Rfl:    rosuvastatin (CRESTOR) 20 MG tablet, Take 20 mg by mouth daily. , Disp: , Rfl:    TRELEGY ELLIPTA 100-62.5-25 MCG/ACT AEPB, INHALE 1 PUFF BY MOUTH EVERY DAY, Disp: 60 each, Rfl: 5

## 2023-03-25 NOTE — Patient Instructions (Addendum)
Continue trelegy 1 puff daily - rinse mouth out after each use  Continue to use albuterol inhaler as needed  Continue montelukast 10mg  daily  Continue zyrtec as needed for allergies  Please call us if you would like to start using nebulizer treatments for mucous clearance  Follow up in 1 year

## 2023-04-22 ENCOUNTER — Other Ambulatory Visit: Payer: Self-pay | Admitting: Pulmonary Disease

## 2023-05-22 ENCOUNTER — Other Ambulatory Visit: Payer: Self-pay | Admitting: Pulmonary Disease

## 2023-05-25 ENCOUNTER — Telehealth: Payer: Self-pay | Admitting: Pulmonary Disease

## 2023-05-25 ENCOUNTER — Other Ambulatory Visit: Payer: Self-pay

## 2023-05-25 MED ORDER — ALBUTEROL SULFATE HFA 108 (90 BASE) MCG/ACT IN AERS: INHALATION_SPRAY | RESPIRATORY_TRACT | 6 refills | Status: DC

## 2023-05-25 MED ORDER — TRELEGY ELLIPTA 100-62.5-25 MCG/ACT IN AEPB: INHALATION_SPRAY | RESPIRATORY_TRACT | 5 refills | Status: DC

## 2023-05-25 NOTE — Telephone Encounter (Signed)
Trelegy and albuterol has been sent to pharmacy. Patient is aware. NFN

## 2023-05-25 NOTE — Telephone Encounter (Signed)
Spoke with patient. Advised Trelegy and albuterol has been sent to pharmacy. NFN

## 2023-06-02 ENCOUNTER — Encounter (INDEPENDENT_AMBULATORY_CARE_PROVIDER_SITE_OTHER): Payer: Medicare Other | Admitting: Ophthalmology

## 2023-06-02 DIAGNOSIS — H43813 Vitreous degeneration, bilateral: Secondary | ICD-10-CM

## 2023-06-02 DIAGNOSIS — H353231 Exudative age-related macular degeneration, bilateral, with active choroidal neovascularization: Secondary | ICD-10-CM | POA: Diagnosis not present

## 2023-06-02 DIAGNOSIS — H35033 Hypertensive retinopathy, bilateral: Secondary | ICD-10-CM

## 2023-06-02 DIAGNOSIS — I10 Essential (primary) hypertension: Secondary | ICD-10-CM

## 2023-09-29 ENCOUNTER — Encounter (INDEPENDENT_AMBULATORY_CARE_PROVIDER_SITE_OTHER): Payer: Medicare Other | Admitting: Ophthalmology

## 2023-09-29 DIAGNOSIS — H43813 Vitreous degeneration, bilateral: Secondary | ICD-10-CM

## 2023-09-29 DIAGNOSIS — H353231 Exudative age-related macular degeneration, bilateral, with active choroidal neovascularization: Secondary | ICD-10-CM | POA: Diagnosis not present

## 2023-09-29 DIAGNOSIS — H35033 Hypertensive retinopathy, bilateral: Secondary | ICD-10-CM

## 2023-09-29 DIAGNOSIS — I10 Essential (primary) hypertension: Secondary | ICD-10-CM

## 2023-11-17 ENCOUNTER — Telehealth: Payer: Self-pay | Admitting: Pulmonary Disease

## 2023-11-17 NOTE — Telephone Encounter (Signed)
Needs Medical Records faxed to the [facility]

## 2023-11-17 NOTE — Telephone Encounter (Signed)
Called and spoke with patient, he stated that he needed his medical records from only Dr. Craige Cotta for a claim with the [place].  I advised him that if he was needing more than a couple of office visit records he would have to go through medical records to have that sent.  I let him know that his records of seeing Dr. Craige Cotta date back to 2013 where he was seen/treated for COPD/Asthma.  I provided him with the phone # for Medical Records, [phone].  He stated he would give them a call.  Nothing further needed.

## 2024-01-01 ENCOUNTER — Other Ambulatory Visit: Payer: Self-pay | Admitting: Pulmonary Disease

## 2024-01-03 IMAGING — CT CT CHEST LUNG CANCER SCREENING LOW DOSE W/O CM
1 of 2 series · 14 of 33 positions shown, 18 images · non-contrast
Comparison: Low-dose lung cancer screening chest CT 11/29/2020.

CLINICAL DATA: 70-year-old male former smoker (quit in 7474) with
79 pack-year history of smoking. Lung cancer screening examination.



[Series 6: super d · axial · 0.84mm/px · z∈[-118,+213]mm · 14 of 454 slices shown, 18 images]
[im 20/454  mediastinal]
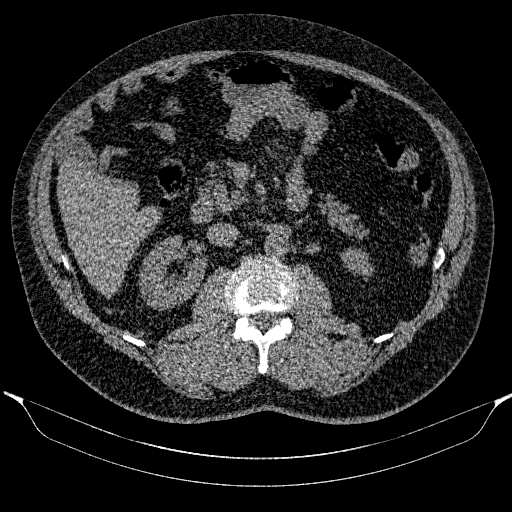
[im 20/454  lung]
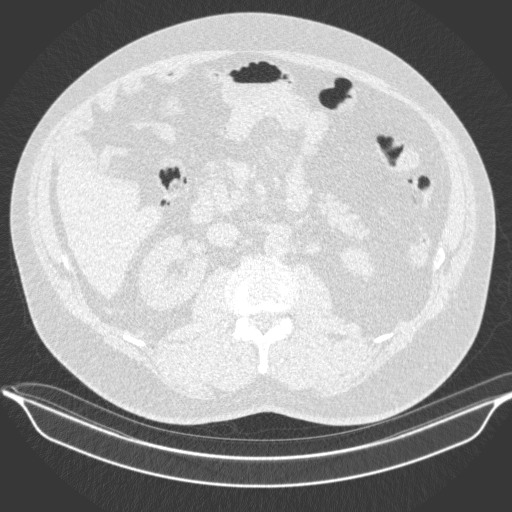
[im 60/454  lung]
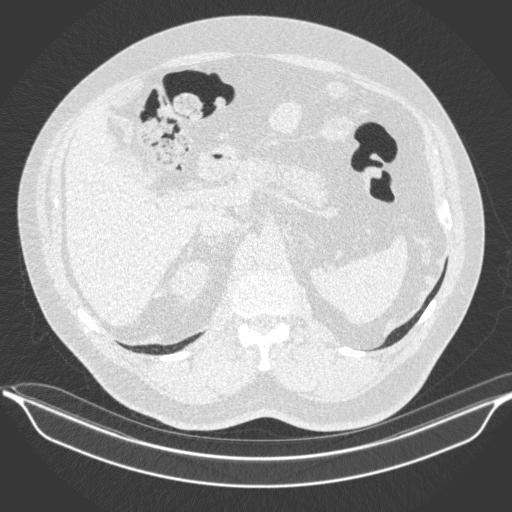
[im 99/454  lung]
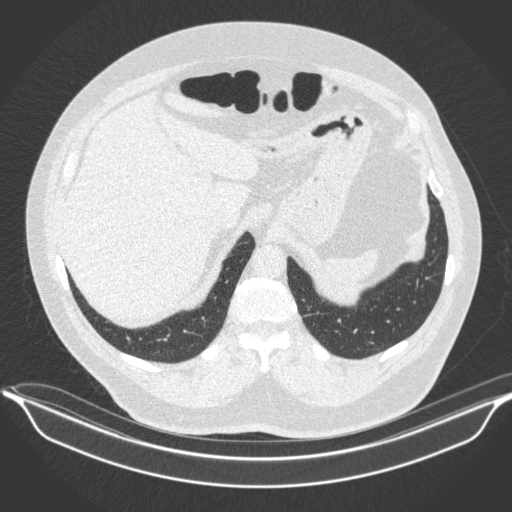
[im 119/454  lung]
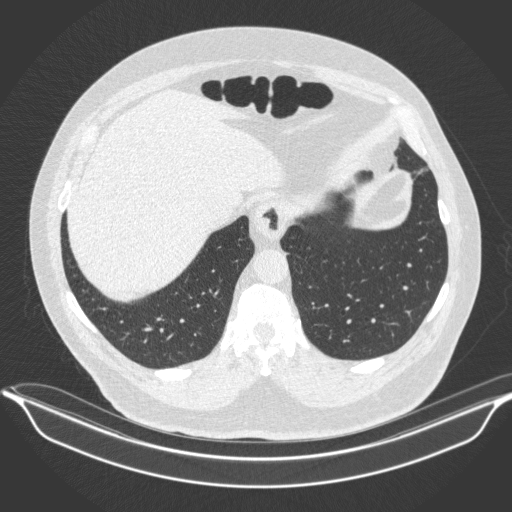
[im 158/454  mediastinal]
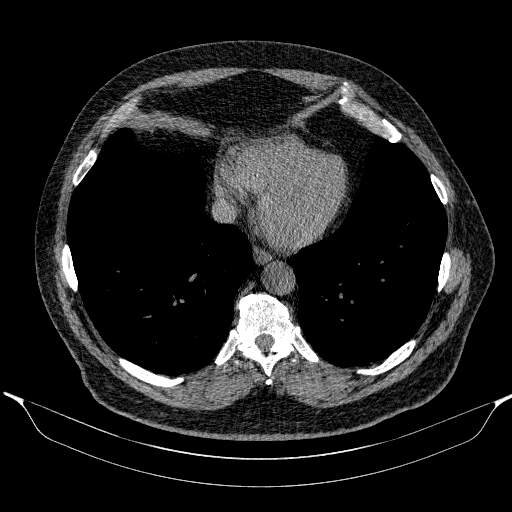
[im 158/454  lung]
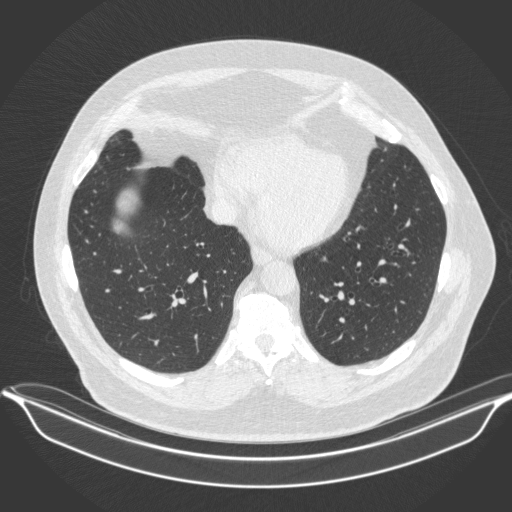
[im 197/454  lung]
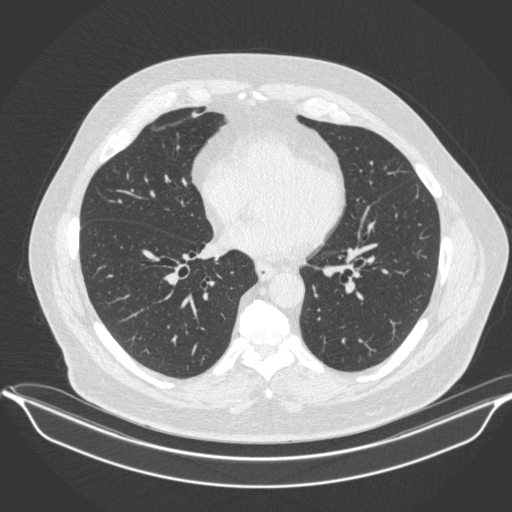
[im 217/454  lung]
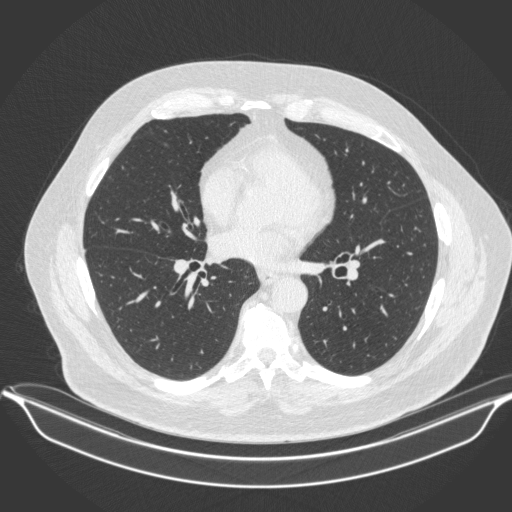
[im 227/454  lung]
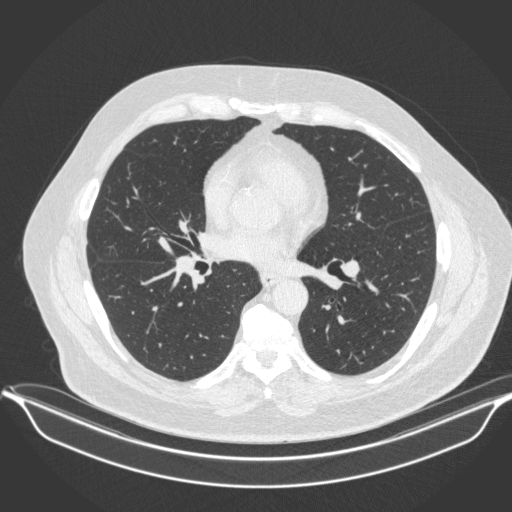
[im 257/454  mediastinal]
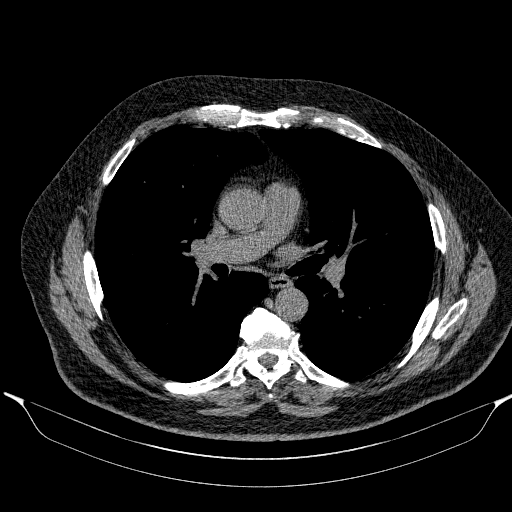
[im 257/454  lung]
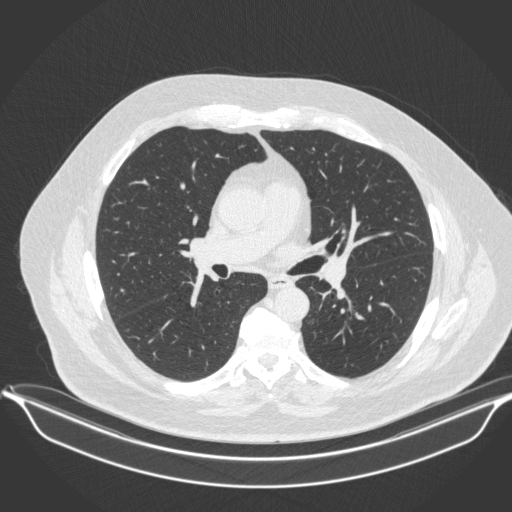
[im 296/454  lung]
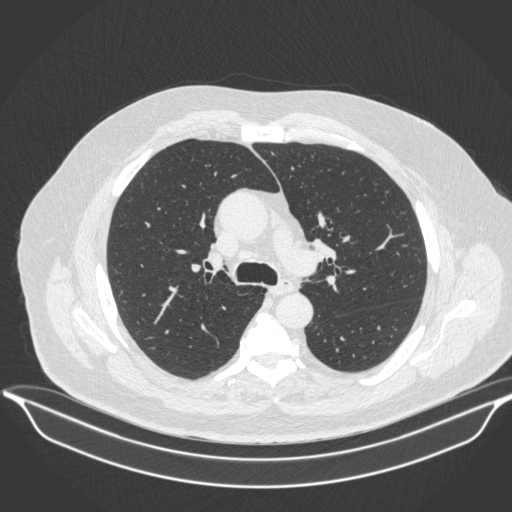
[im 335/454  lung]
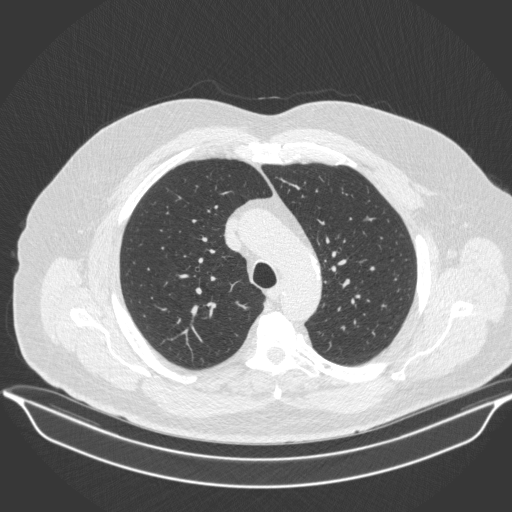
[im 355/454  lung]
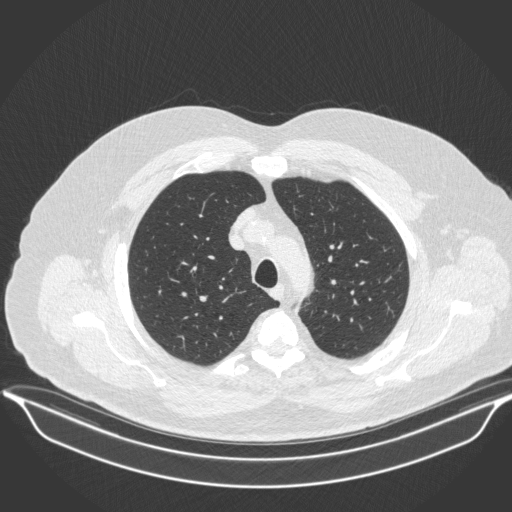
[im 394/454  mediastinal]
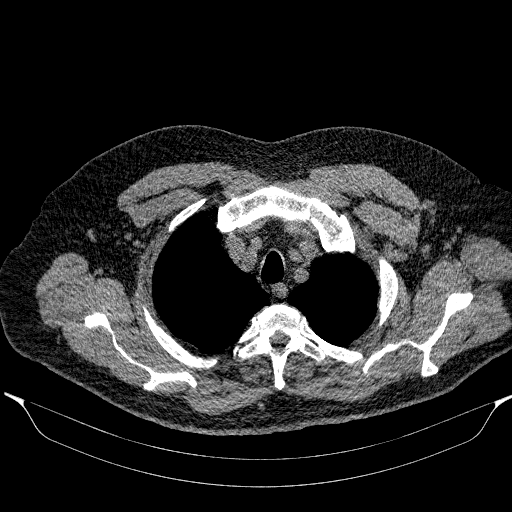
[im 394/454  lung]
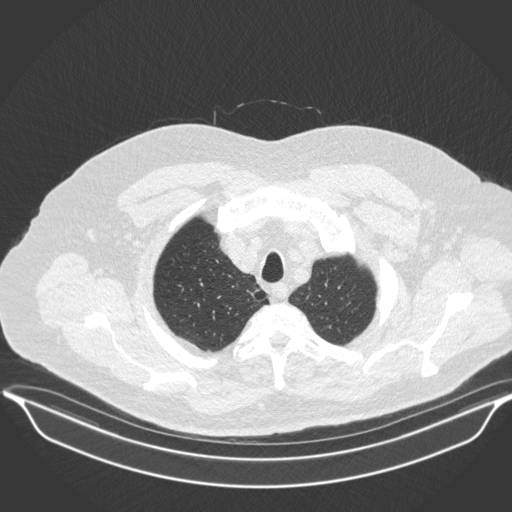
[im 434/454  lung]
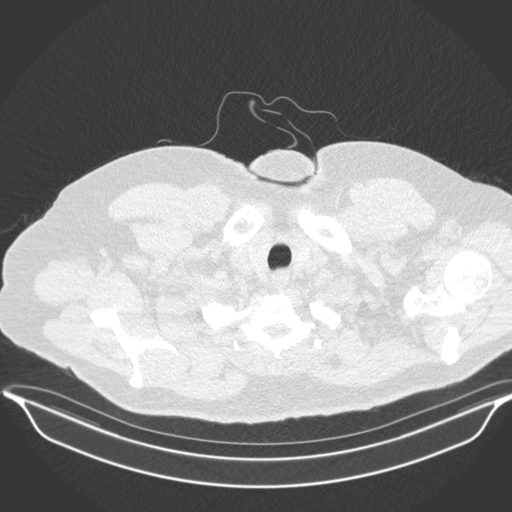

[14 of 33 positions shown; findings below may reference images not displayed]

FINDINGS: Cardiovascular: Heart size is normal. There is no significant
pericardial fluid, thickening or pericardial calcification. There is
aortic atherosclerosis, as well as atherosclerosis of the great
vessels of the mediastinum and the coronary arteries, including
calcified atherosclerotic plaque in the right coronary artery.

Mediastinum/Nodes: No pathologically enlarged mediastinal or hilar
lymph nodes. Please note that accurate exclusion of hilar adenopathy
is limited on noncontrast CT scans. Small hiatal hernia. No axillary
lymphadenopathy.

Lungs/Pleura: No suspicious appearing pulmonary nodules or masses
are noted. No acute consolidative airspace disease. No pleural
effusions. Mild diffuse bronchial wall thickening with mild
centrilobular and paraseptal emphysema.

Upper Abdomen: Aortic atherosclerosis.

Musculoskeletal: Unremarkable.
IMPRESSION: 1. Lung-RADS 1S, negative. Continue annual screening with low-dose
chest CT without contrast in 12 months.
2. The "S" modifier above refers to potentially clinically
significant non lung cancer related findings. Specifically, there is
aortic atherosclerosis, in addition to right coronary artery
disease. Please note that although the presence of coronary artery
calcium documents the presence of coronary artery disease, the
severity of this disease and any potential stenosis cannot be
assessed on this non-gated CT examination. Assessment for potential
risk factor modification, dietary therapy or pharmacologic therapy
may be warranted, if clinically indicated.
3. Mild diffuse bronchial wall thickening with mild centrilobular
and paraseptal emphysema; imaging findings suggestive of underlying
COPD.

Aortic Atherosclerosis (I7JJE-[id number].Q) and Emphysema ([id number].N).

## 2024-01-12 ENCOUNTER — Encounter (INDEPENDENT_AMBULATORY_CARE_PROVIDER_SITE_OTHER): Payer: Medicare Other | Admitting: Ophthalmology

## 2024-01-12 DIAGNOSIS — I10 Essential (primary) hypertension: Secondary | ICD-10-CM

## 2024-01-12 DIAGNOSIS — H43813 Vitreous degeneration, bilateral: Secondary | ICD-10-CM

## 2024-01-12 DIAGNOSIS — H353231 Exudative age-related macular degeneration, bilateral, with active choroidal neovascularization: Secondary | ICD-10-CM | POA: Diagnosis not present

## 2024-01-12 DIAGNOSIS — H35033 Hypertensive retinopathy, bilateral: Secondary | ICD-10-CM

## 2024-01-19 ENCOUNTER — Encounter (INDEPENDENT_AMBULATORY_CARE_PROVIDER_SITE_OTHER): Payer: Medicare Other | Admitting: Ophthalmology

## 2024-02-18 ENCOUNTER — Encounter: Payer: Self-pay | Admitting: Pulmonary Disease

## 2024-02-18 ENCOUNTER — Ambulatory Visit (INDEPENDENT_AMBULATORY_CARE_PROVIDER_SITE_OTHER): Payer: Medicare Other | Admitting: Pulmonary Disease

## 2024-02-18 VITALS — BP 110/68 | HR 66 | Ht 71.0 in | Wt 243.0 lb

## 2024-02-18 DIAGNOSIS — Z87891 Personal history of nicotine dependence: Secondary | ICD-10-CM

## 2024-02-18 DIAGNOSIS — J4489 Other specified chronic obstructive pulmonary disease: Secondary | ICD-10-CM | POA: Diagnosis not present

## 2024-02-18 MED ORDER — ALBUTEROL SULFATE HFA 108 (90 BASE) MCG/ACT IN AERS: INHALATION_SPRAY | RESPIRATORY_TRACT | 3 refills | Status: DC

## 2024-02-18 NOTE — Progress Notes (Signed)
 Synopsis: Former smoker with COPD/Asthma, previously followed by Dr. Craige Cotta.   Subjective:   PATIENT ID: Tim Thomas GENDER: male DOB: May 24, 1951, MRN: [id number]  HPI  Chief Complaint  Patient presents with   Follow-up    Pt states he's been doing well only had to [place] his inhaler once this past week   Tim Thomas is a 73 year old male, former smoker who returns to pulmonary clinic for COPD/asthma.   OV 03/25/23 He has been on trelegy ellipta 100, 1 puff daily and as needed albuterol along montelukast 10mg  daily.   He had URI in early March and has had increased cough with mucous production. He is using albuterol 1-2 times per day. No night time awakenings due to cough or wheezing. He is using zyrtec as needed for spring allergies.  He is a retired Astronomer, retired in 2003. Had home sleep study done recently and is awaiting results.    OV 02/18/24 He has been doing well since last visit. He continues on trelegy ellipta daily 1 puff and as needed albuterol. He rarely uses albuterol. No complaints at this time. He has lung cancer CT test coming up in a couple of weeks.  Past Medical History:  Diagnosis Date   Anxiety    BPH (benign prostatic hyperplasia)    COPD (chronic obstructive pulmonary disease) (HCC)    COPD with asthma (HCC) 04/28/2012   Foley catheter in place    02-17-18, occ clots in bag   High cholesterol    Hypertension    Prostate enlargement    Shoulder pain    right chronic   Urinary retention     august  2018   Wet senile macular degeneration (HCC)     right eye current tx, left eye tx in past     Family History  Problem Relation Age of Onset   Lung cancer Mother    COPD Mother    Thyroid disease Sister      Social History   Socioeconomic History   Marital status: Married    Spouse name: Not on file   Number of children: Not on file   Years of education: Not on file   Highest education level: Not on file  Occupational History    Occupation: retired    Associate Professor: UNEMPLOYED  Tobacco Use   Smoking status: Former    Current packs/day: 0.00    Average packs/day: 1.5 packs/day for 53.0 years (79.5 ttl pk-yrs)    Types: Cigarettes    Start date: 03/03/1966    Quit date: 03/04/2019    Years since quitting: 4.9   Smokeless tobacco: Never  Vaping Use   Vaping status: Never Used  Substance and Sexual Activity   Alcohol use: Yes    Alcohol/week: 0.0 standard drinks of alcohol    Comment: none since Dec 23, 2017   Drug use: No   Sexual activity: Not on file  Other Topics Concern   Not on file  Social History Narrative   Right handed.  Caffeine 5 cups avg daily.  Married, 4 kids.  Retired.  AD police science.   Social Drivers of Corporate investment banker Strain: Not on file  Food Insecurity: Not on file  Transportation Needs: Not on file  Physical Activity: Not on file  Stress: Not on file  Social Connections: Not on file  Intimate Partner Violence: Not on file     Allergies  Allergen Reactions   Bee Venom Swelling  Facial and sit of sting swelling Has EPi-pen   Ciprofloxacin Other (See Comments)    Neck stiffness and pains   Ivp Dye [Iodinated Contrast Media] Hives and Other (See Comments)   Shellfish Allergy     Per patient's allergist to avoid shellfish due to allergy to contrast dye.    Sulfa Antibiotics Anxiety     Outpatient Medications Prior to Visit  Medication Sig Dispense Refill   acetaminophen (TYLENOL) 500 MG tablet Take 500 mg by mouth every 6 (six) hours as needed for moderate pain.     candesartan (ATACAND) 16 MG tablet Take 1 tablet by mouth daily.     cetirizine (ZYRTEC) 10 MG tablet Take 10 mg by mouth daily. As needed     EPINEPHrine 0.3 mg/0.3 mL IJ SOAJ injection Inject 0.3 mLs (0.3 mg total) into the muscle once. (Patient taking differently: Inject 0.3 mg into the muscle as needed for anaphylaxis.) 2 Device 1   hydrALAZINE (APRESOLINE) 25 MG tablet Take by mouth.     indapamide  (LOZOL) 2.5 MG tablet Take by mouth.     linaclotide (LINZESS) 145 MCG CAPS capsule Take 145 mcg by mouth daily as needed.     minocycline (DYNACIN) 100 MG tablet Take 100 mg by mouth 2 (two) times daily.     montelukast (SINGULAIR) 10 MG tablet TAKE 1 TABLET BY MOUTH EVERYDAY AT BEDTIME 90 tablet 3   rosuvastatin (CRESTOR) 20 MG tablet Take 20 mg by mouth daily.      TRELEGY ELLIPTA 100-62.5-25 MCG/ACT AEPB INHALE 1 PUFF BY MOUTH EVERY DAY 60 each 5   albuterol (VENTOLIN HFA) 108 (90 Base) MCG/ACT inhaler TAKE 2 PUFFS BY MOUTH EVERY 6 HOURS AS NEEDED FOR WHEEZE OR SHORTNESS OF BREATH 8.5 each 6   amLODipine (NORVASC) 10 MG tablet Take 10 mg by mouth daily.      losartan (COZAAR) 100 MG tablet Take 100 mg by mouth daily.     NEVANAC 0.1 % ophthalmic suspension SMARTSIG:In Eye(s)     No facility-administered medications prior to visit.    Review of Systems  Constitutional:  Negative for chills, fever, malaise/fatigue and weight loss.  HENT:  Negative for congestion, sinus pain and sore throat.   Eyes: Negative.   Respiratory:  Negative for cough, hemoptysis, sputum production, shortness of breath and wheezing.   Cardiovascular:  Negative for chest pain, palpitations, orthopnea, claudication and leg swelling.  Gastrointestinal:  Negative for abdominal pain, heartburn, nausea and vomiting.  Genitourinary: Negative.   Musculoskeletal:  Negative for joint pain and myalgias.  Skin:  Negative for rash.  Neurological:  Negative for weakness.  Endo/Heme/Allergies: Negative.   Psychiatric/Behavioral: Negative.     Objective:   Vitals:   02/18/24 1002  BP: 110/68  Pulse: 66  SpO2: 98%  Weight: 243 lb (110.2 kg)  Height: 5\' 11"  (1.803 m)     Physical Exam Constitutional:      General: He is not in acute distress.    Appearance: He is obese.  HENT:     Head: Normocephalic and atraumatic.  Eyes:     Conjunctiva/sclera: Conjunctivae normal.  Cardiovascular:     Rate and Rhythm:  Normal rate and regular rhythm.     Pulses: Normal pulses.     Heart sounds: Normal heart sounds. No murmur heard. Pulmonary:     Effort: Pulmonary effort is normal.     Breath sounds: Normal breath sounds. No wheezing, rhonchi or rales.  Musculoskeletal:  Right lower leg: No edema.     Left lower leg: No edema.  Skin:    General: Skin is warm and dry.  Neurological:     General: No focal deficit present.     Mental Status: He is alert.    CBC    Component Value Date/Time   WBC 8.8 01/25/2021 1047   RBC 5.01 01/25/2021 1047   HGB 14.9 01/25/2021 1047   HCT 44.8 01/25/2021 1047   PLT 227.0 01/25/2021 1047   MCV 89.4 01/25/2021 1047   MCH 29.7 03/21/2019 0229   MCHC 33.4 01/25/2021 1047   RDW 14.5 01/25/2021 1047   LYMPHSABS 1.8 01/25/2021 1047   MONOABS 0.9 01/25/2021 1047   EOSABS 0.1 01/25/2021 1047   BASOSABS 0.1 01/25/2021 1047     Chest imaging: CT Chest 02/26/23 1. Lung-RADS 1, negative. Continue annual screening with low-dose chest CT without contrast in 12 months. 2. Coronary artery calcifications, aortic Atherosclerosis (ICD10-I70.0) and Emphysema (ICD10-J43.9).  PFT:    Latest Ref Rng & Units 11/02/2019   12:52 PM  PFT Results  FVC-Pre L 4.84   FVC-Predicted Pre % 120   FVC-Post L 4.88   FVC-Predicted Post % 121   Pre FEV1/FVC % % 69   Post FEV1/FCV % % 75   FEV1-Pre L 3.33   FEV1-Predicted Pre % 109   FEV1-Post L 3.64   DLCO uncorrected ml/min/mmHg 32.33   DLCO UNC% % 120   DLVA Predicted % 101   TLC L 8.31   TLC % Predicted % 115   RV % Predicted % 128   PFTs 2020 Mild obstruction with air trapping  Labs:  Path:  Echo:  Heart Catheterization:  Assessment & Plan:   COPD with asthma (HCC) - Plan: albuterol (VENTOLIN HFA) 108 (90 Base) MCG/ACT inhaler  Former smoker  Discussion: Samin Milke is a 73 year old male, former smoker who returns to pulmonary clinic for COPD/asthma.   He is to continue trelegy ellipta , 1 puff  daily and as needed albuterol.   He is to continue montelukast 10mg  daily and as needed zyrtec.   Refill sent to pharmacy.   He is enrolled in lung cancer screening program.  Follow up in 1 year, call sooner if needed.  Melody Comas, MD [facility] Office: [phone]   Current Outpatient Medications:    acetaminophen (TYLENOL) 500 MG tablet, Take 500 mg by mouth every 6 (six) hours as needed for moderate pain., Disp: , Rfl:    candesartan (ATACAND) 16 MG tablet, Take 1 tablet by mouth daily., Disp: , Rfl:    cetirizine (ZYRTEC) 10 MG tablet, Take 10 mg by mouth daily. As needed, Disp: , Rfl:    EPINEPHrine 0.3 mg/0.3 mL IJ SOAJ injection, Inject 0.3 mLs (0.3 mg total) into the muscle once. (Patient taking differently: Inject 0.3 mg into the muscle as needed for anaphylaxis.), Disp: 2 Device, Rfl: 1   hydrALAZINE (APRESOLINE) 25 MG tablet, Take by mouth., Disp: , Rfl:    indapamide (LOZOL) 2.5 MG tablet, Take by mouth., Disp: , Rfl:    linaclotide (LINZESS) 145 MCG CAPS capsule, Take 145 mcg by mouth daily as needed., Disp: , Rfl:    minocycline (DYNACIN) 100 MG tablet, Take 100 mg by mouth 2 (two) times daily., Disp: , Rfl:    montelukast (SINGULAIR) 10 MG tablet, TAKE 1 TABLET BY MOUTH EVERYDAY AT BEDTIME, Disp: 90 tablet, Rfl: 3   rosuvastatin (CRESTOR) 20 MG tablet,  Take 20 mg by mouth daily. , Disp: , Rfl:    TRELEGY ELLIPTA 100-62.5-25 MCG/ACT AEPB, INHALE 1 PUFF BY MOUTH EVERY DAY, Disp: 60 each, Rfl: 5   albuterol (VENTOLIN HFA) 108 (90 Base) MCG/ACT inhaler, TAKE 2 PUFFS BY MOUTH EVERY 6 HOURS AS NEEDED FOR WHEEZE OR SHORTNESS OF BREATH, Disp: 8.5 each, Rfl: 3

## 2024-02-18 NOTE — Patient Instructions (Signed)
 Continue trelegy ellipta 1 puff daily - rinse mouth out after each use  Continue albuterol inhaler 1-2 puff every 4-6 hours as needed  Continue montelukast daily  We will follow up your lung cancer screening scan  Follow up in 1 year, call sooner if needed

## 2024-02-29 ENCOUNTER — Ambulatory Visit
Admission: RE | Admit: 2024-02-29 | Discharge: 2024-02-29 | Disposition: A | Discharge status: Home / Self Care | Payer: Medicare Other | Source: Ambulatory Visit | Attending: Acute Care | Admitting: Acute Care

## 2024-02-29 DIAGNOSIS — Z87891 Personal history of nicotine dependence: Secondary | ICD-10-CM

## 2024-02-29 DIAGNOSIS — Z122 Encounter for screening for malignant neoplasm of respiratory organs: Secondary | ICD-10-CM

## 2024-03-04 ENCOUNTER — Encounter: Payer: Self-pay | Admitting: Pulmonary Disease

## 2024-03-28 ENCOUNTER — Other Ambulatory Visit: Payer: Self-pay

## 2024-03-28 DIAGNOSIS — Z122 Encounter for screening for malignant neoplasm of respiratory organs: Secondary | ICD-10-CM

## 2024-03-28 DIAGNOSIS — Z87891 Personal history of nicotine dependence: Secondary | ICD-10-CM

## 2024-03-31 ENCOUNTER — Encounter: Payer: Self-pay | Admitting: Pulmonary Disease

## 2024-05-03 ENCOUNTER — Encounter (INDEPENDENT_AMBULATORY_CARE_PROVIDER_SITE_OTHER): Payer: Medicare Other | Admitting: Ophthalmology

## 2024-05-03 DIAGNOSIS — H43813 Vitreous degeneration, bilateral: Secondary | ICD-10-CM | POA: Diagnosis not present

## 2024-05-03 DIAGNOSIS — H353231 Exudative age-related macular degeneration, bilateral, with active choroidal neovascularization: Secondary | ICD-10-CM | POA: Diagnosis not present

## 2024-05-03 DIAGNOSIS — I10 Essential (primary) hypertension: Secondary | ICD-10-CM | POA: Diagnosis not present

## 2024-05-03 DIAGNOSIS — H35033 Hypertensive retinopathy, bilateral: Secondary | ICD-10-CM

## 2024-05-25 ENCOUNTER — Encounter (INDEPENDENT_AMBULATORY_CARE_PROVIDER_SITE_OTHER): Admitting: Ophthalmology

## 2024-05-25 DIAGNOSIS — H26491 Other secondary cataract, right eye: Secondary | ICD-10-CM | POA: Diagnosis not present

## 2024-06-22 ENCOUNTER — Other Ambulatory Visit: Payer: Self-pay | Admitting: Adult Health

## 2024-07-05 ENCOUNTER — Encounter (INDEPENDENT_AMBULATORY_CARE_PROVIDER_SITE_OTHER): Admitting: Ophthalmology

## 2024-07-05 DIAGNOSIS — Z961 Presence of intraocular lens: Secondary | ICD-10-CM

## 2024-08-24 ENCOUNTER — Encounter (INDEPENDENT_AMBULATORY_CARE_PROVIDER_SITE_OTHER): Admitting: Ophthalmology

## 2024-08-24 DIAGNOSIS — H353231 Exudative age-related macular degeneration, bilateral, with active choroidal neovascularization: Secondary | ICD-10-CM | POA: Diagnosis not present

## 2024-08-24 DIAGNOSIS — I10 Essential (primary) hypertension: Secondary | ICD-10-CM

## 2024-08-24 DIAGNOSIS — H43813 Vitreous degeneration, bilateral: Secondary | ICD-10-CM | POA: Diagnosis not present

## 2024-08-24 DIAGNOSIS — H35033 Hypertensive retinopathy, bilateral: Secondary | ICD-10-CM | POA: Diagnosis not present

## 2024-08-29 ENCOUNTER — Other Ambulatory Visit: Payer: Self-pay | Admitting: Internal Medicine

## 2024-08-29 DIAGNOSIS — I77810 Thoracic aortic ectasia: Secondary | ICD-10-CM

## 2024-08-29 DIAGNOSIS — M546 Pain in thoracic spine: Secondary | ICD-10-CM

## 2024-08-30 ENCOUNTER — Telehealth: Payer: Self-pay

## 2024-08-30 ENCOUNTER — Inpatient Hospital Stay: Admission: RE | Admit: 2024-08-30 | Source: Ambulatory Visit

## 2024-08-30 MED ORDER — PREDNISONE 50 MG PO TABS: ORAL_TABLET | ORAL | 0 refills | Status: AC

## 2024-08-30 NOTE — Telephone Encounter (Signed)
 Phone call to patient to review instructions for 13 hr prep for CT w/ contrast on 08/31/24  at 1:00PM. Prescription called into [facility]. Pt aware and verbalized understanding of instructions. Prescription:  Pt to take 50 mg of prednisone  on 08/31/24 at 12:00AM, 50 mg of prednisone  on 08/31/24 at 6:00AM, and 50 mg of prednisone  on 08/31/24 at 12:00PM. Pt is also to take 50 mg of benadryl  on 08/31/24 at 12:00PM.

## 2024-08-31 ENCOUNTER — Ambulatory Visit
Admission: RE | Admit: 2024-08-31 | Discharge: 2024-08-31 | Disposition: A | Discharge status: Home / Self Care | Source: Ambulatory Visit | Attending: Internal Medicine | Admitting: Internal Medicine

## 2024-08-31 DIAGNOSIS — I77810 Thoracic aortic ectasia: Secondary | ICD-10-CM

## 2024-08-31 DIAGNOSIS — M546 Pain in thoracic spine: Secondary | ICD-10-CM

## 2024-08-31 MED ORDER — IOPAMIDOL (ISOVUE-370) INJECTION 76%
75.0000 mL | Freq: Once | INTRAVENOUS | Status: AC | PRN
  Administered 2024-08-31: 75 mL via INTRAVENOUS

## 2024-09-29 ENCOUNTER — Other Ambulatory Visit: Payer: Self-pay | Admitting: Pulmonary Disease

## 2024-09-29 DIAGNOSIS — J4489 Other specified chronic obstructive pulmonary disease: Secondary | ICD-10-CM

## 2024-09-29 NOTE — Telephone Encounter (Unsigned)
 Copied from CRM [id number]. Topic: Clinical - Medication Refill >> Sep 29, 2024 12:37 PM Whitney O wrote: Medication: montelukast  (SINGULAIR ) 10 MG tablet  Has the patient contacted their pharmacy? Yes they sent in a request and it has not been responded to  (Agent: If no, request that the patient contact the pharmacy for the refill. If patient does not wish to contact the pharmacy document the reason why and proceed with request.) (Agent: If yes, when and what did the pharmacy advise?)  This is the patient's preferred pharmacy:  [phone] [place] - [address] [place] [address] Phone: [phone] Fax: [phone]  Is this the correct pharmacy for this prescription? Yes If no, delete pharmacy and type the correct one.  [id number] [place] - [address] [place] [address] Phone: [phone] Fax: [phone]   Has the prescription been filled recently? No  Is the patient out of the medication? No have about 4 tablets left  Has the patient been seen for an appointment in the last year OR does the patient have an upcoming appointment? Yes 02/18/2024  Can we respond through MyChart? No  Agent: Please be advised that Rx refills may take up to 3 business days. We ask that you follow-up with your pharmacy.

## 2024-09-30 ENCOUNTER — Other Ambulatory Visit: Payer: Self-pay | Admitting: *Deleted

## 2024-09-30 ENCOUNTER — Encounter: Payer: Self-pay | Admitting: Pulmonary Disease

## 2024-09-30 MED ORDER — MONTELUKAST SODIUM 10 MG PO TABS: 10.0000 mg | ORAL_TABLET | Freq: Every day | ORAL | 2 refills | Status: AC

## 2024-10-21 NOTE — Progress Notes (Signed)
 "  [facility]- [facility] Orthopaedic Spine Surgery Outpatient Visit     Tim Thomas DOB: September 11, 1951 MRN: [id number] 10/21/2024  Chief Complaint  Patient presents with   Middle Back - Pain    Thoracic spine pain     PCP: No primary care provider on file. Referring Provider: Valma Carwin, MD  HISTORY: Tim Thomas is a 73 y.o.  male that is seen today as a initial evaluation. He is being evaluated for his neck and shoulder pain. Pain started about 8 weeks ago. Some symptoms he describes are aching, stabbing, pens and needles, with some numbness in the left and right fingertips. Pain is a 9 out of 10.   Physical EXAM: VITALS: Ht 5' 11 (1.803 m)   Wt 240 lb (108.9 kg)   BMI 33.47 kg/m  BMI Readings from Last 1 Encounters:  10/21/24 33.47 kg/m   Spine Musculoskeletal Exam  Gait   Gait is normal.  Inspection   Leg length disparity: no discrepancy   Cervical Spine   Erythema: none   Swelling: none   Deformity: none   Prior incision: no previous incision  Palpation   Cervical Spine   Tenderness: present     Trapezius: right and left     Periscapular: right and left   Right     Masses: none   Left     Masses: none  Strength   Cervical Spine     Right     Deltoid: 5/5.      Biceps: 5/5.      Triceps: 5/5.      Interossei: 5/5.      Left     Deltoid: 5/5.      Biceps: 5/5.      Triceps: 5/5.      Interossei: 5/5.   Sensory   Cervical Spine     Right      Deltoid patch: normal     Lateral arm: normal     Thumb/index finger: normal     Middle finger: normal     Little finger: normal     Left       Deltoid patch: normal     Lateral arm: normal     Thumb/index finger: normal     Middle finger: normal     Little finger: normal  Reflexes   Right       Biceps: 2/4     Brachioradialis: 2/4     Triceps: 2/4   Left       Biceps: 2/4       Brachioradialis: 2/4     Triceps: 2/4  Special Tests   Cervical Spine      Right     Spurling's: negative     Left     Spurling's: negative   IMAGING: I personally reviewed this/these film(s) and radiology report(s), with my interpretation as listed in the above paragraph(s). Cervical xrays: 10/21/24: multilevel spondylosis from C2-T1. Cervical kyphosis at C3-4.  Spine Surgical History: NA   Problem LIst:   ICD-10-CM   1. Cervical spondylosis with radiculopathy  M47.22 XR Spine  Cervical 4-5 Views    2. Thoracic spine pain  M54.6 XR Spine  Thoracic 2 Views      Assessment/Plan: 73 year old male patient that is seen today as initial evaluation.  Patient is complaining of neck pain with radiation to bilateral shoulders.  X-rays performed today in the office showed multilevel spondylosis with diffuse idiopathic skeletal hyperostosis changes from C2-T1.  He also has the same changes in the thoracic spine which is possibly ankylosed already.  This spondylitic changes could be causing multilevel central or foraminal stenosis which can explain patient's symptoms of bilateral upper extremity radiculopathy.   We reviewed medications. Today we will start gabapentin 300mg  at bedtime for his persistent pain.   We would like a cervical MRI for further evaluation for his cervical spine. We would like to plan for possible injections  depending on MRI results.           Follow up in about 4 weeks (around 11/18/2024) for With Dr. Marlyce, for MRI review.  Orders Placed This Encounter  Procedures   XR Spine  Thoracic 2 Views   XR Spine  Cervical 4-5 Views    Special Views to be Performed:   AP & Lateral    Special Views to be Performed:   Flexion, Extension    On the day of the encounter, a total of 45 minutes were spent on this patient encounter including history, physical exam, review of historical information, review of imaging, assessment, plan, documentation.   Electronically signed by Donnajean Marlyce, MD on 10/21/2024 at 10:06 AM  "

## 2024-12-21 ENCOUNTER — Emergency Department
Admission: EM | Admit: 2024-12-21 | Discharge: 2024-12-21 | Disposition: A | Discharge status: Home / Self Care | Attending: Emergency Medicine | Admitting: Emergency Medicine

## 2024-12-21 ENCOUNTER — Emergency Department

## 2024-12-21 ENCOUNTER — Other Ambulatory Visit: Payer: Self-pay

## 2024-12-21 DIAGNOSIS — R0789 Other chest pain: Secondary | ICD-10-CM | POA: Insufficient documentation

## 2024-12-21 DIAGNOSIS — R079 Chest pain, unspecified: Secondary | ICD-10-CM

## 2024-12-21 DIAGNOSIS — R55 Syncope and collapse: Secondary | ICD-10-CM | POA: Diagnosis not present

## 2024-12-21 LAB — CBC
HCT: 42.8 % (ref 39.0–52.0)
Hemoglobin: 14.4 g/dL (ref 13.0–17.0)
MCH: 29.6 pg (ref 26.0–34.0)
MCHC: 33.6 g/dL (ref 30.0–36.0)
MCV: 87.9 fL (ref 80.0–100.0)
Platelets: 258 K/uL (ref 150–400)
RBC: 4.87 MIL/uL (ref 4.22–5.81)
RDW: 13.8 % (ref 11.5–15.5)
WBC: 6.1 K/uL (ref 4.0–10.5)
nRBC: 0 % (ref 0.0–0.2)

## 2024-12-21 LAB — BASIC METABOLIC PANEL WITH GFR
Anion gap: 13 (ref 5–15)
BUN: 20 mg/dL (ref 8–23)
CO2: 27 mmol/L (ref 22–32)
Calcium: 9.7 mg/dL (ref 8.9–10.3)
Chloride: 97 mmol/L — ABNORMAL LOW (ref 98–111)
Creatinine, Ser: 1.23 mg/dL (ref 0.61–1.24)
GFR, Estimated: >60 mL/min
Glucose, Bld: 102 mg/dL — ABNORMAL HIGH (ref 70–99)
Potassium: 4 mmol/L (ref 3.5–5.1)
Sodium: 137 mmol/L (ref 135–145)

## 2024-12-21 LAB — TROPONIN T, HIGH SENSITIVITY
Troponin T High Sensitivity: 17 ng/L (ref 0–19)
Troponin T High Sensitivity: 17 ng/L (ref 0–19)

## 2024-12-21 MED ORDER — IOHEXOL 350 MG/ML SOLN
100.0000 mL | Freq: Once | INTRAVENOUS | Status: AC | PRN
  Administered 2024-12-21: 100 mL via INTRAVENOUS

## 2024-12-21 MED ORDER — METHYLPREDNISOLONE SODIUM SUCC 40 MG IJ SOLR
40.0000 mg | Freq: Once | INTRAMUSCULAR | Status: AC
  Administered 2024-12-21: 40 mg via INTRAVENOUS
  Filled 2024-12-21: qty 1

## 2024-12-21 MED ORDER — DIPHENHYDRAMINE HCL 25 MG PO CAPS: 50.0000 mg | ORAL_CAPSULE | Freq: Once | ORAL | Status: DC

## 2024-12-21 MED ORDER — DIPHENHYDRAMINE HCL 50 MG/ML IJ SOLN
50.0000 mg | Freq: Once | INTRAMUSCULAR | Status: AC
  Administered 2024-12-21: 50 mg via INTRAVENOUS
  Filled 2024-12-21: qty 1

## 2024-12-21 NOTE — ED Provider Notes (Signed)
 "  [facility] Provider Note    Event Date/Time   First MD Initiated Contact with Patient 12/21/24 1232     (approximate)   History   Chest Pain   HPI  Tim Thomas is a 73 y.o. male patient has a past medical history significant for thoracic aortic aneurysm, chronic back pain, presents to the emergency department with chest pain, back pain and an episode concern for syncope.  Patient states that he has been seen by his primary care physician a couple of times for this worsening upper back pain.  States that he has had CT scans and had questionable concerns for enlarging aneurysm.  States that he has been having some chest pressure sensation over the past couple of days and feels like someone is sitting on his chest.  Today when he was walking into the kitchen he just felt off and like he might pass out and lose consciousness.  States that he had some lab work done with his primary care physician recently for this chest pain but that he went out of town and he does not know the results of it.  Denies any significant shortness of breath.  Denies nausea vomiting or diaphoresis.  Chart review most recent CT angio of his chest on 08/31/2024 had an aortic root that was dilated to 4.3 and a normal caliber of his ascending thoracic aorta.  No signs of dissection.  Atherosclerosis.     Physical Exam   Triage Vital Signs: ED Triage Vitals  Encounter Vitals Group     BP 12/21/24 1227 133/67     Girls Systolic BP Percentile --      Girls Diastolic BP Percentile --      Boys Systolic BP Percentile --      Boys Diastolic BP Percentile --      Pulse Rate 12/21/24 1227 73     Resp 12/21/24 1227 (!) 22     Temp 12/21/24 1227 (!) 97.5 F (36.4 C)     Temp Source 12/21/24 1227 Oral     SpO2 12/21/24 1227 98 %     Weight 12/21/24 1226 240 lb (108.9 kg)     Height 12/21/24 1226 5' 11 (1.803 m)     Head Circumference --      Peak Flow --      Pain Score 12/21/24 1226 7      Pain Loc --      Pain Education --      Exclude from Growth Chart --     Most recent vital signs: Vitals:   12/21/24 1550 12/21/24 1600  BP:    Pulse: (!) 59 64  Resp: 19 18  Temp:    SpO2:      Physical Exam Constitutional:      Appearance: He is well-developed.  HENT:     Head: Atraumatic.  Eyes:     Conjunctiva/sclera: Conjunctivae normal.  Cardiovascular:     Rate and Rhythm: Regular rhythm.     Pulses:          Dorsalis pedis pulses are 2+ on the right side and 2+ on the left side.       Posterior tibial pulses are 2+ on the right side and 2+ on the left side.     Heart sounds: Normal heart sounds.  Pulmonary:     Effort: No tachypnea or respiratory distress.  Abdominal:     Palpations: Abdomen is soft.  Musculoskeletal:  Cervical back: Normal range of motion.     Right lower leg: No edema.     Left lower leg: No edema.  Skin:    General: Skin is warm.     Capillary Refill: Capillary refill takes less than 2 seconds.  Neurological:     General: No focal deficit present.     Mental Status: He is alert. Mental status is at baseline.     IMPRESSION / MDM / ASSESSMENT AND PLAN / ED COURSE  I reviewed the triage vital signs and the nursing notes.  Differential diagnosis include ACS, anemia, dissection, progression of his known aneurysm, pneumonia, dysrhythmia  Plan for CTA to further evaluate for dissection or worsening of his aneurysm.  Has a contrast allergy so we will start with pretreatment with steroids and Benadryl .  Contrast allergy is hives.   EKG  I, Clotilda Punter, the attending physician, personally viewed and interpreted this ECG.  EKG showed normal sinus rhythm.  Incomplete right bundle branch block.  1 PVC present.  Nonspecific ST changes.  No significant ST elevation.  No significant change when compared to prior EKG.  No tachycardic or bradycardic dysrhythmias while on cardiac telemetry.  RADIOLOGY Chest x-ray with no widened  mediastinum and no signs of pneumonia or pneumothorax. LABS (all labs ordered are listed, but only abnormal results are displayed) Labs interpreted as -    Labs Reviewed  BASIC METABOLIC PANEL WITH GFR - Abnormal; Notable for the following components:      Result Value   Chloride 97 (*)    Glucose, Bld 102 (*)    All other components within normal limits  CBC  TROPONIN T, HIGH SENSITIVITY  TROPONIN T, HIGH SENSITIVITY     MDM  Patient with no leukocytosis or anemia.  No findings concerning for pneumonia.  Creatinine at baseline with no significant electrolyte abnormality.  Serial troponins are negative at 17.  Moderate risk heart score.  Current plan is to wait for CTA at time of signout.  Patient would be high risk syncope.  Will offer admission.     PROCEDURES:  Critical Care performed: No  Procedures  Patient's presentation is most consistent with acute presentation with potential threat to life or bodily function.   MEDICATIONS ORDERED IN ED: Medications  diphenhydrAMINE  (BENADRYL ) injection 50 mg (has no administration in time range)  methylPREDNISolone  sodium succinate (SOLU-MEDROL ) 40 mg/mL injection 40 mg (40 mg Intravenous Given 12/21/24 1317)    FINAL CLINICAL IMPRESSION(S) / ED DIAGNOSES   Final diagnoses:  Chest pain, unspecified type  Syncope, unspecified syncope type     Rx / DC Orders   ED Discharge Orders     None        Note:  This document was prepared using Dragon voice recognition software and may include unintentional dictation errors.   Punter Clotilda, MD 12/21/24 1611  "

## 2024-12-21 NOTE — ED Notes (Signed)
 REports pressure is gone, but pain remains, 4/10.

## 2024-12-21 NOTE — ED Triage Notes (Signed)
 Pt presents to ED from home C/O chest pain X 2 weeks. Worsening chest pain and pressure over the last day, reports SOB and radiation to back. Possible syncopal episode.   Hx aortic aneurysm, reports PCP told him it was larger on last scan about a week ago.

## 2024-12-21 NOTE — ED Notes (Signed)
 Alert, NAD, calm, interactive, resps e/u, speaking in clear complete sentences, VSS. L&R BPs equal. Skin W&D. Rates chest pressure heaviness 5/10 and endorses fatigue.Denies other.

## 2024-12-21 NOTE — ED Provider Notes (Signed)
----------------------------------------- °  3:39 PM on 12/21/2024 -----------------------------------------  Blood pressure 131/74, pulse (!) 59, temperature (!) 97.5 F (36.4 C), temperature source Oral, resp. rate 19, height 5' 11 (1.803 m), weight 108.9 kg, SpO2 99%.  Assuming care from Dr. Suzanne.  In short, Tim Thomas is a 73 y.o. male with a chief complaint of back pain and pre-syncope with history of thoracic aortic aneurysm.  Refer to the original H&P for additional details.  The current plan of care is to follow-up CTA chest following pretreatment for contrast allergy.  ----------------------------------------- 7:12 PM on 12/21/2024 ----------------------------------------- CTA chest is negative for dissection or other acute finding.  Cardiac workup has also been reassuring with 2 sets of troponin being negative.  Patient offered admission to the hospital but prefers to follow-up as an outpatient, which is reasonable given his reassuring workup.  Referral provided to cardiology and he was counseled to return to the ED for new or worsening symptoms, patient agrees with plan.      Medications  methylPREDNISolone  sodium succinate (SOLU-MEDROL ) 40 mg/mL injection 40 mg (40 mg Intravenous Given 12/21/24 1317)  diphenhydrAMINE  (BENADRYL ) injection 50 mg (50 mg Intravenous Given 12/21/24 1618)  iohexol  (OMNIPAQUE ) 350 MG/ML injection 100 mL (100 mLs Intravenous Contrast Given 12/21/24 1728)     ED Discharge Orders          Ordered    Ambulatory referral to Cardiology        12/21/24 1911           Final diagnoses:  Chest pain, unspecified type  Syncope, unspecified syncope type      Willo Dunnings, MD 12/21/24 [phone]

## 2024-12-27 ENCOUNTER — Other Ambulatory Visit: Payer: Self-pay | Admitting: Cardiology

## 2024-12-27 DIAGNOSIS — R0609 Other forms of dyspnea: Secondary | ICD-10-CM

## 2024-12-27 DIAGNOSIS — I700 Atherosclerosis of aorta: Secondary | ICD-10-CM

## 2024-12-27 DIAGNOSIS — R0789 Other chest pain: Secondary | ICD-10-CM

## 2024-12-27 DIAGNOSIS — I10 Essential (primary) hypertension: Secondary | ICD-10-CM

## 2024-12-27 DIAGNOSIS — E782 Mixed hyperlipidemia: Secondary | ICD-10-CM

## 2024-12-27 DIAGNOSIS — Z87891 Personal history of nicotine dependence: Secondary | ICD-10-CM

## 2024-12-28 ENCOUNTER — Encounter (INDEPENDENT_AMBULATORY_CARE_PROVIDER_SITE_OTHER): Admitting: Ophthalmology

## 2024-12-28 DIAGNOSIS — I10 Essential (primary) hypertension: Secondary | ICD-10-CM

## 2024-12-28 DIAGNOSIS — H43813 Vitreous degeneration, bilateral: Secondary | ICD-10-CM

## 2024-12-28 DIAGNOSIS — H353231 Exudative age-related macular degeneration, bilateral, with active choroidal neovascularization: Secondary | ICD-10-CM

## 2024-12-28 DIAGNOSIS — H35033 Hypertensive retinopathy, bilateral: Secondary | ICD-10-CM | POA: Diagnosis not present

## 2024-12-30 ENCOUNTER — Telehealth (HOSPITAL_COMMUNITY): Payer: Self-pay | Admitting: Emergency Medicine

## 2024-12-30 NOTE — Telephone Encounter (Signed)
 Attempted to call patient regarding upcoming cardiac CT appointment. Left message on voicemail with name and callback number Rockwell Alexandria RN Navigator Cardiac Imaging [facility] [phone] Office [phone] Cell

## 2024-12-30 NOTE — Telephone Encounter (Signed)
 Reaching out to patient to offer assistance regarding upcoming cardiac imaging study; pt verbalizes understanding of appt date/time, parking situation and where to check in, pre-test NPO status and medications ordered, and verified current allergies; name and call back number provided for further questions should they arise Rockwell Alexandria RN Navigator Cardiac Imaging [facility] [phone] office [phone] cell

## 2025-01-02 ENCOUNTER — Ambulatory Visit
Admission: RE | Admit: 2025-01-02 | Discharge: 2025-01-02 | Disposition: A | Discharge status: Home / Self Care | Source: Ambulatory Visit | Attending: Cardiology | Admitting: Cardiology

## 2025-01-02 DIAGNOSIS — R0789 Other chest pain: Secondary | ICD-10-CM | POA: Insufficient documentation

## 2025-01-02 DIAGNOSIS — I700 Atherosclerosis of aorta: Secondary | ICD-10-CM | POA: Insufficient documentation

## 2025-01-02 DIAGNOSIS — I10 Essential (primary) hypertension: Secondary | ICD-10-CM | POA: Insufficient documentation

## 2025-01-02 DIAGNOSIS — E782 Mixed hyperlipidemia: Secondary | ICD-10-CM | POA: Insufficient documentation

## 2025-01-02 DIAGNOSIS — Z87891 Personal history of nicotine dependence: Secondary | ICD-10-CM | POA: Diagnosis present

## 2025-01-02 DIAGNOSIS — R0609 Other forms of dyspnea: Secondary | ICD-10-CM | POA: Diagnosis present

## 2025-01-02 MED ORDER — METOPROLOL TARTRATE 5 MG/5ML IV SOLN: 10.0000 mg | Freq: Once | INTRAVENOUS | Status: DC | PRN

## 2025-01-02 MED ORDER — DILTIAZEM HCL 25 MG/5ML IV SOLN: 10.0000 mg | INTRAVENOUS | Status: DC | PRN

## 2025-01-02 MED ORDER — NITROGLYCERIN 0.4 MG SL SUBL
0.8000 mg | SUBLINGUAL_TABLET | Freq: Once | SUBLINGUAL | Status: AC
  Administered 2025-01-02: 0.8 mg via SUBLINGUAL

## 2025-01-02 MED ORDER — IOHEXOL 350 MG/ML SOLN
100.0000 mL | Freq: Once | INTRAVENOUS | Status: AC | PRN
  Administered 2025-01-02: 100 mL via INTRAVENOUS

## 2025-01-02 NOTE — Progress Notes (Signed)
 Patient tolerated procedure well. W/C to lobby.  Ambulate w/o difficulty. Denies light headedness or being dizzy. Encouraged to drink extra water today and reasoning explained. Verbalized understanding. All questions answered. ABC intact. No further needs. Discharge from procedure area w/o issues.

## 2025-01-05 ENCOUNTER — Telehealth: Payer: Self-pay

## 2025-01-05 NOTE — Telephone Encounter (Signed)
 Spoke with patient. CT angio Chest 12/21/2024 showed no nodules. Next annual Lung Cancer Screening due 12/22/2025. Pt aware he will get a call again later this year to schedule. Reminder set.

## 2025-01-15 ENCOUNTER — Other Ambulatory Visit: Payer: Self-pay | Admitting: Pulmonary Disease

## 2025-01-17 ENCOUNTER — Other Ambulatory Visit: Payer: Self-pay

## 2025-01-17 ENCOUNTER — Telehealth: Payer: Self-pay

## 2025-01-17 MED ORDER — TRELEGY ELLIPTA 100-62.5-25 MCG/ACT IN AEPB: INHALATION_SPRAY | RESPIRATORY_TRACT | 6 refills | Status: AC

## 2025-01-17 NOTE — Telephone Encounter (Signed)
 Copied from CRM #[id number]. Topic: Clinical - Medication Refill >> Jan 17, 2025  9:56 AM Russell PARAS wrote: Medication:   Fluticasone -Umeclidin-Vilant (TRELEGY ELLIPTA ) 100-62.5-25 MCG/ACT AEPB  Has the patient contacted their pharmacy? Yes (Agent: If no, request that the patient contact the pharmacy for the refill. If patient does not wish to contact the pharmacy document the reason why and proceed with request.) (Agent: If yes, when and what did the pharmacy advise?)  This is the patient's preferred pharmacy:  [facility] [address], [place] - [address] [place] [address] Phone: [phone] Fax: [phone]  Is this the correct pharmacy for this prescription? Yes If no, delete pharmacy and type the correct one.   Has the prescription been filled recently? Yes  Is the patient out of the medication? No  Has the patient been seen for an appointment in the last year OR does the patient have an upcoming appointment? Yes  Can we respond through MyChart? Yes  Agent: Please be advised that Rx refills may take up to 3 business days. We ask that you follow-up with your pharmacy.     Rx has been sent to pharmacy must keep upcoming appointment

## 2025-02-13 ENCOUNTER — Ambulatory Visit: Admitting: Adult Health

## 2025-05-03 ENCOUNTER — Encounter (INDEPENDENT_AMBULATORY_CARE_PROVIDER_SITE_OTHER): Admitting: Ophthalmology
